# Patient Record
Sex: Female | Born: 1935 | Race: White | Hispanic: No | Smoking: Never smoker
Health system: Southern US, Community
[De-identification: ages and names within clinical notes are randomized; demographics above are authoritative.]

## PROBLEM LIST (undated history)

## (undated) DIAGNOSIS — M1990 Unspecified osteoarthritis, unspecified site: Secondary | ICD-10-CM

## (undated) DIAGNOSIS — E039 Hypothyroidism, unspecified: Secondary | ICD-10-CM

## (undated) DIAGNOSIS — H25019 Cortical age-related cataract, unspecified eye: Secondary | ICD-10-CM

## (undated) DIAGNOSIS — L219 Seborrheic dermatitis, unspecified: Secondary | ICD-10-CM

## (undated) DIAGNOSIS — E785 Hyperlipidemia, unspecified: Secondary | ICD-10-CM

## (undated) DIAGNOSIS — Z923 Personal history of irradiation: Secondary | ICD-10-CM

## (undated) DIAGNOSIS — F329 Major depressive disorder, single episode, unspecified: Secondary | ICD-10-CM

## (undated) DIAGNOSIS — F419 Anxiety disorder, unspecified: Secondary | ICD-10-CM

## (undated) DIAGNOSIS — M109 Gout, unspecified: Secondary | ICD-10-CM

## (undated) DIAGNOSIS — C801 Malignant (primary) neoplasm, unspecified: Secondary | ICD-10-CM

## (undated) DIAGNOSIS — I10 Essential (primary) hypertension: Secondary | ICD-10-CM

## (undated) DIAGNOSIS — Z8612 Personal history of poliomyelitis: Secondary | ICD-10-CM

## (undated) DIAGNOSIS — Z872 Personal history of diseases of the skin and subcutaneous tissue: Secondary | ICD-10-CM

## (undated) DIAGNOSIS — Z853 Personal history of malignant neoplasm of breast: Secondary | ICD-10-CM

## (undated) DIAGNOSIS — N289 Disorder of kidney and ureter, unspecified: Secondary | ICD-10-CM

## (undated) DIAGNOSIS — I319 Disease of pericardium, unspecified: Secondary | ICD-10-CM

## (undated) DIAGNOSIS — E079 Disorder of thyroid, unspecified: Secondary | ICD-10-CM

## (undated) DIAGNOSIS — F32A Depression, unspecified: Secondary | ICD-10-CM

## (undated) DIAGNOSIS — M810 Age-related osteoporosis without current pathological fracture: Secondary | ICD-10-CM

## (undated) HISTORY — DX: Personal history of malignant neoplasm of breast: Z85.3

## (undated) HISTORY — DX: Disorder of kidney and ureter, unspecified: N28.9

## (undated) HISTORY — DX: Personal history of poliomyelitis: Z86.12

## (undated) HISTORY — DX: Disease of pericardium, unspecified: I31.9

## (undated) HISTORY — DX: Hyperlipidemia, unspecified: E78.5

## (undated) HISTORY — DX: Seborrheic dermatitis, unspecified: L21.9

## (undated) HISTORY — PX: BASAL CELL CARCINOMA EXCISION: SHX1214

## (undated) HISTORY — DX: Age-related osteoporosis without current pathological fracture: M81.0

## (undated) HISTORY — PX: BACK SURGERY: SHX140

## (undated) HISTORY — DX: Major depressive disorder, single episode, unspecified: F32.9

## (undated) HISTORY — DX: Cortical age-related cataract, unspecified eye: H25.019

## (undated) HISTORY — DX: Depression, unspecified: F32.A

## (undated) HISTORY — DX: Personal history of diseases of the skin and subcutaneous tissue: Z87.2

## (undated) HISTORY — DX: Unspecified osteoarthritis, unspecified site: M19.90

---

## 2003-12-24 ENCOUNTER — Ambulatory Visit: Payer: Self-pay | Admitting: Family Medicine

## 2004-04-14 HISTORY — PX: FRACTURE SURGERY: SHX138

## 2004-12-03 ENCOUNTER — Emergency Department: Payer: Self-pay | Admitting: Emergency Medicine

## 2004-12-31 ENCOUNTER — Ambulatory Visit: Payer: Self-pay | Admitting: Family Medicine

## 2005-02-18 ENCOUNTER — Other Ambulatory Visit: Payer: Self-pay

## 2005-02-18 ENCOUNTER — Inpatient Hospital Stay: Payer: Self-pay | Admitting: Internal Medicine

## 2005-03-15 ENCOUNTER — Ambulatory Visit: Payer: Self-pay | Admitting: Cardiology

## 2005-03-15 HISTORY — PX: CARDIAC CATHETERIZATION: SHX172

## 2005-05-15 HISTORY — PX: FRACTURE SURGERY: SHX138

## 2005-05-16 ENCOUNTER — Emergency Department: Payer: Self-pay | Admitting: Emergency Medicine

## 2005-05-17 HISTORY — PX: FRACTURE SURGERY: SHX138

## 2005-05-18 ENCOUNTER — Inpatient Hospital Stay: Payer: Self-pay | Admitting: General Practice

## 2006-01-17 ENCOUNTER — Ambulatory Visit: Payer: Self-pay | Admitting: Family Medicine

## 2006-02-14 HISTORY — PX: CATARACT EXTRACTION: SUR2

## 2006-05-04 ENCOUNTER — Ambulatory Visit: Payer: Self-pay | Admitting: Ophthalmology

## 2006-05-10 ENCOUNTER — Ambulatory Visit: Payer: Self-pay | Admitting: Ophthalmology

## 2006-07-30 ENCOUNTER — Emergency Department: Payer: Self-pay | Admitting: Emergency Medicine

## 2006-07-30 ENCOUNTER — Other Ambulatory Visit: Payer: Self-pay

## 2007-01-24 ENCOUNTER — Ambulatory Visit: Payer: Self-pay | Admitting: Family Medicine

## 2008-02-19 ENCOUNTER — Ambulatory Visit: Payer: Self-pay | Admitting: Family Medicine

## 2008-02-27 ENCOUNTER — Ambulatory Visit: Payer: Self-pay | Admitting: Family Medicine

## 2008-04-10 ENCOUNTER — Ambulatory Visit: Payer: Self-pay | Admitting: Ophthalmology

## 2008-04-23 ENCOUNTER — Ambulatory Visit: Payer: Self-pay | Admitting: Ophthalmology

## 2008-06-01 ENCOUNTER — Emergency Department: Payer: Self-pay | Admitting: Emergency Medicine

## 2008-07-21 ENCOUNTER — Ambulatory Visit: Payer: Self-pay | Admitting: Internal Medicine

## 2008-11-20 ENCOUNTER — Ambulatory Visit: Payer: Self-pay | Admitting: Family Medicine

## 2009-02-14 DIAGNOSIS — C801 Malignant (primary) neoplasm, unspecified: Secondary | ICD-10-CM

## 2009-02-14 HISTORY — PX: MASTECTOMY PARTIAL / LUMPECTOMY: SUR851

## 2009-02-14 HISTORY — DX: Malignant (primary) neoplasm, unspecified: C80.1

## 2009-02-14 HISTORY — PX: BREAST BIOPSY: SHX20

## 2009-03-05 ENCOUNTER — Ambulatory Visit: Payer: Self-pay | Admitting: Family Medicine

## 2009-03-12 ENCOUNTER — Ambulatory Visit: Payer: Self-pay | Admitting: Family Medicine

## 2009-04-01 ENCOUNTER — Ambulatory Visit: Payer: Self-pay | Admitting: Surgery

## 2009-04-27 ENCOUNTER — Ambulatory Visit: Payer: Self-pay | Admitting: Surgery

## 2009-05-15 ENCOUNTER — Ambulatory Visit: Payer: Self-pay | Admitting: Internal Medicine

## 2009-05-29 ENCOUNTER — Ambulatory Visit: Payer: Self-pay | Admitting: Surgery

## 2009-06-10 ENCOUNTER — Ambulatory Visit: Payer: Self-pay | Admitting: Internal Medicine

## 2009-06-14 ENCOUNTER — Ambulatory Visit: Payer: Self-pay | Admitting: Internal Medicine

## 2009-07-15 ENCOUNTER — Ambulatory Visit: Payer: Self-pay | Admitting: Internal Medicine

## 2009-08-14 ENCOUNTER — Ambulatory Visit: Payer: Self-pay | Admitting: Internal Medicine

## 2009-09-14 ENCOUNTER — Ambulatory Visit: Payer: Self-pay | Admitting: Internal Medicine

## 2009-10-15 ENCOUNTER — Ambulatory Visit: Payer: Self-pay | Admitting: Internal Medicine

## 2010-02-17 ENCOUNTER — Ambulatory Visit: Payer: Self-pay | Admitting: Internal Medicine

## 2010-03-08 ENCOUNTER — Ambulatory Visit: Payer: Self-pay | Admitting: Family Medicine

## 2010-03-17 ENCOUNTER — Ambulatory Visit: Payer: Self-pay | Admitting: Internal Medicine

## 2010-08-17 ENCOUNTER — Ambulatory Visit: Payer: Self-pay | Admitting: Internal Medicine

## 2010-08-19 LAB — CANCER ANTIGEN 27.29: CA 27.29: 20.1 U/mL (ref 0.0–38.6)

## 2010-09-15 ENCOUNTER — Ambulatory Visit: Payer: Self-pay | Admitting: Internal Medicine

## 2011-03-15 ENCOUNTER — Ambulatory Visit: Payer: Self-pay | Admitting: Surgery

## 2011-04-04 ENCOUNTER — Emergency Department: Payer: Self-pay | Admitting: Emergency Medicine

## 2011-04-04 LAB — URINALYSIS, COMPLETE
Bilirubin,UR: NEGATIVE
Blood: NEGATIVE
Glucose,UR: NEGATIVE mg/dL (ref 0–75)
Nitrite: POSITIVE
Ph: 7 (ref 4.5–8.0)
Protein: NEGATIVE
RBC,UR: 3 /HPF (ref 0–5)
Specific Gravity: 1.014 (ref 1.003–1.030)
Squamous Epithelial: 6

## 2011-04-04 LAB — COMPREHENSIVE METABOLIC PANEL
Albumin: 3.2 g/dL — ABNORMAL LOW (ref 3.4–5.0)
Alkaline Phosphatase: 47 U/L — ABNORMAL LOW (ref 50–136)
BUN: 25 mg/dL — ABNORMAL HIGH (ref 7–18)
Calcium, Total: 9.2 mg/dL (ref 8.5–10.1)
Creatinine: 1.42 mg/dL — ABNORMAL HIGH (ref 0.60–1.30)
EGFR (African American): 45 — ABNORMAL LOW
Glucose: 104 mg/dL — ABNORMAL HIGH (ref 65–99)
Osmolality: 288 (ref 275–301)
Sodium: 142 mmol/L (ref 136–145)
Total Protein: 7.3 g/dL (ref 6.4–8.2)

## 2011-04-04 LAB — CBC
HCT: 39.7 % (ref 35.0–47.0)
HGB: 13 g/dL (ref 12.0–16.0)
MCH: 30.4 pg (ref 26.0–34.0)
MCHC: 32.8 g/dL (ref 32.0–36.0)
MCV: 93 fL (ref 80–100)
Platelet: 202 10*3/uL (ref 150–440)
RBC: 4.29 10*6/uL (ref 3.80–5.20)
RDW: 13.7 % (ref 11.5–14.5)
WBC: 6.6 10*3/uL (ref 3.6–11.0)

## 2011-04-04 LAB — MAGNESIUM: Magnesium: 1.9 mg/dL

## 2011-06-21 ENCOUNTER — Ambulatory Visit: Payer: Self-pay | Admitting: Internal Medicine

## 2011-06-21 LAB — COMPREHENSIVE METABOLIC PANEL
Alkaline Phosphatase: 65 U/L (ref 50–136)
BUN: 27 mg/dL — ABNORMAL HIGH (ref 7–18)
Bilirubin,Total: 0.3 mg/dL (ref 0.2–1.0)
Chloride: 104 mmol/L (ref 98–107)
Glucose: 93 mg/dL (ref 65–99)
Osmolality: 290 (ref 275–301)
Potassium: 3.8 mmol/L (ref 3.5–5.1)
SGOT(AST): 23 U/L (ref 15–37)
SGPT (ALT): 21 U/L
Total Protein: 7.4 g/dL (ref 6.4–8.2)

## 2011-06-21 LAB — CBC CANCER CENTER
Basophil #: 0 x10 3/mm (ref 0.0–0.1)
Basophil %: 0.6 %
Eosinophil #: 0.4 x10 3/mm (ref 0.0–0.7)
Eosinophil %: 5.2 %
HCT: 40 % (ref 35.0–47.0)
Lymphocyte #: 1.5 x10 3/mm (ref 1.0–3.6)
MCHC: 32 g/dL (ref 32.0–36.0)
MCV: 94 fL (ref 80–100)
Monocyte #: 0.8 x10 3/mm (ref 0.2–0.9)
Neutrophil #: 4.4 x10 3/mm (ref 1.4–6.5)
Neutrophil %: 61.5 %
Platelet: 226 x10 3/mm (ref 150–440)
RBC: 4.28 10*6/uL (ref 3.80–5.20)
RDW: 13.6 % (ref 11.5–14.5)

## 2011-06-22 LAB — CANCER ANTIGEN 27.29: CA 27.29: 9.3 U/mL (ref 0.0–38.6)

## 2011-07-16 ENCOUNTER — Ambulatory Visit: Payer: Self-pay | Admitting: Internal Medicine

## 2011-08-24 ENCOUNTER — Ambulatory Visit: Payer: Self-pay | Admitting: Internal Medicine

## 2011-09-15 ENCOUNTER — Ambulatory Visit: Payer: Self-pay | Admitting: Internal Medicine

## 2012-03-15 ENCOUNTER — Ambulatory Visit: Payer: Self-pay | Admitting: Surgery

## 2012-03-19 ENCOUNTER — Ambulatory Visit: Payer: Self-pay | Admitting: Internal Medicine

## 2012-04-26 ENCOUNTER — Ambulatory Visit: Payer: Self-pay | Admitting: Internal Medicine

## 2012-04-27 LAB — CBC CANCER CENTER
Basophil #: 0.1 x10 3/mm (ref 0.0–0.1)
Basophil %: 1.1 %
Eosinophil #: 0.3 x10 3/mm (ref 0.0–0.7)
Eosinophil %: 3.6 %
HCT: 39.2 % (ref 35.0–47.0)
HGB: 12.9 g/dL (ref 12.0–16.0)
Lymphocyte #: 1.8 x10 3/mm (ref 1.0–3.6)
Lymphocyte %: 23.6 %
MCHC: 33 g/dL (ref 32.0–36.0)
Monocyte %: 11.2 %
Platelet: 232 x10 3/mm (ref 150–440)
RDW: 14.1 % (ref 11.5–14.5)
WBC: 7.4 x10 3/mm (ref 3.6–11.0)

## 2012-04-27 LAB — CREATININE, SERUM
Creatinine: 1.29 mg/dL (ref 0.60–1.30)
EGFR (African American): 44 — ABNORMAL LOW
EGFR (Non-African Amer.): 38 — ABNORMAL LOW

## 2012-04-27 LAB — HEPATIC FUNCTION PANEL A (ARMC)
Bilirubin, Direct: <0.05 mg/dL (ref 0.00–0.20)
Bilirubin,Total: 0.3 mg/dL (ref 0.2–1.0)
Total Protein: 7.2 g/dL (ref 6.4–8.2)

## 2012-04-28 LAB — CANCER ANTIGEN 27.29: CA 27.29: 20 U/mL (ref 0.0–38.6)

## 2012-05-15 ENCOUNTER — Ambulatory Visit: Payer: Self-pay | Admitting: Internal Medicine

## 2012-08-21 ENCOUNTER — Ambulatory Visit: Payer: Self-pay | Admitting: Internal Medicine

## 2012-08-27 ENCOUNTER — Emergency Department: Payer: Self-pay | Admitting: Emergency Medicine

## 2012-08-27 LAB — URINALYSIS, COMPLETE
Ketone: NEGATIVE
Nitrite: POSITIVE
Ph: 6 (ref 4.5–8.0)
Protein: NEGATIVE
RBC,UR: 19 /HPF (ref 0–5)
Specific Gravity: 1.02 (ref 1.003–1.030)
Squamous Epithelial: 1
WBC UR: 320 /HPF (ref 0–5)

## 2012-08-29 LAB — URINE CULTURE

## 2013-04-05 ENCOUNTER — Ambulatory Visit: Payer: Self-pay | Admitting: Surgery

## 2013-05-13 ENCOUNTER — Ambulatory Visit: Payer: Self-pay | Admitting: Internal Medicine

## 2013-05-14 LAB — CBC CANCER CENTER
Basophil #: 0.1 x10 3/mm (ref 0.0–0.1)
Basophil %: 1 %
EOS PCT: 2.9 %
Eosinophil #: 0.2 x10 3/mm (ref 0.0–0.7)
HCT: 40.4 % (ref 35.0–47.0)
HGB: 12.7 g/dL (ref 12.0–16.0)
Lymphocyte #: 1.6 x10 3/mm (ref 1.0–3.6)
Lymphocyte %: 19.5 %
MCH: 28.9 pg (ref 26.0–34.0)
MCHC: 31.4 g/dL — ABNORMAL LOW (ref 32.0–36.0)
MCV: 92 fL (ref 80–100)
Monocyte #: 0.8 x10 3/mm (ref 0.2–0.9)
Monocyte %: 9.6 %
NEUTROS PCT: 67 %
Neutrophil #: 5.6 x10 3/mm (ref 1.4–6.5)
Platelet: 256 x10 3/mm (ref 150–440)
RBC: 4.39 10*6/uL (ref 3.80–5.20)
RDW: 13.8 % (ref 11.5–14.5)
WBC: 8.3 x10 3/mm (ref 3.6–11.0)

## 2013-05-14 LAB — HEPATIC FUNCTION PANEL A (ARMC)
Albumin: 3 g/dL — ABNORMAL LOW (ref 3.4–5.0)
Alkaline Phosphatase: 54 U/L
Bilirubin, Direct: 0.1 mg/dL (ref 0.00–0.20)
Bilirubin,Total: 0.3 mg/dL (ref 0.2–1.0)
SGOT(AST): 17 U/L (ref 15–37)
SGPT (ALT): 13 U/L (ref 12–78)
Total Protein: 7.3 g/dL (ref 6.4–8.2)

## 2013-05-14 LAB — CREATININE, SERUM
Creatinine: 1.37 mg/dL — ABNORMAL HIGH (ref 0.60–1.30)
EGFR (African American): 40 — ABNORMAL LOW
GFR CALC NON AF AMER: 35 — AB

## 2013-05-15 ENCOUNTER — Ambulatory Visit: Payer: Self-pay | Admitting: Internal Medicine

## 2013-05-15 LAB — CANCER ANTIGEN 27.29: CA 27.29: 14.9 U/mL (ref 0.0–38.6)

## 2013-07-23 ENCOUNTER — Emergency Department: Payer: Self-pay | Admitting: Emergency Medicine

## 2013-09-18 ENCOUNTER — Ambulatory Visit (INDEPENDENT_AMBULATORY_CARE_PROVIDER_SITE_OTHER): Payer: Medicare Other | Admitting: Podiatry

## 2013-09-18 ENCOUNTER — Encounter: Payer: Self-pay | Admitting: Podiatry

## 2013-09-18 ENCOUNTER — Ambulatory Visit (INDEPENDENT_AMBULATORY_CARE_PROVIDER_SITE_OTHER): Payer: Medicare Other

## 2013-09-18 VITALS — BP 116/61 | HR 69 | Resp 16 | Ht 64.0 in | Wt 205.0 lb

## 2013-09-18 DIAGNOSIS — M109 Gout, unspecified: Secondary | ICD-10-CM

## 2013-09-18 NOTE — Progress Notes (Signed)
   Subjective:    Patient ID: Andrea Schaefer, female    DOB: [birth year], 80 y.o.   MRN: [id number]  HPI Comments: i did have pain in my rt foot. i was diagnosed with gout about 2 months ago. i had surgery on my rt foot 2 months ago at [facility] in the ER. They sliced it open and said it was gout. My foot remains the same. i cant wear shoes. i dont do anything for my foot.  Foot Pain Associated symptoms include fatigue and weakness.      Review of Systems  Constitutional: Positive for fatigue.  HENT: Positive for hearing loss.   Eyes: Positive for redness and itching.  Cardiovascular: Positive for palpitations and leg swelling.  Genitourinary: Positive for urgency and frequency.  Musculoskeletal:       Difficulty walking   Neurological: Positive for tremors, weakness and light-headedness.  Psychiatric/Behavioral: The patient is nervous/anxious.   All other systems reviewed and are negative.      Objective:   Physical Exam: I have reviewed her past medical history medications allergies surgeries social history and review of systems. Pulses are strongly palpable bilateral. Neurologic sensorium is intact per Semmes-Weinstein monofilament. Deep tendon reflexes are intact bilateral muscle strength is 5 over 5 dorsiflexors plantar flexors inverters everters all intrinsic musculature is intact. Orthopedic evaluation demonstrates mild HAV deformity hammertoe deformities are noted bilateral right is most symptomatic with overlying soft tissue mass over the first metatarsophalangeal joint with a central opening for what appears to be drainage. Radiographic evaluation demonstrates minimal osseous abnormalities other than hallux valgus deformity. We tried draining the lesion however the material was too thick and get appear to be a gouty tophus.        Assessment & Plan:  Assessment: Gouty tophus right first metatarsophalangeal joint.  Plan: Try to drain the tophus today however we were unable to do  so. I injected a small amount of Kenalog into the area and will followup with her on an as-needed basis.

## 2013-10-25 ENCOUNTER — Inpatient Hospital Stay: Payer: Self-pay | Admitting: Internal Medicine

## 2013-10-25 DIAGNOSIS — I059 Rheumatic mitral valve disease, unspecified: Secondary | ICD-10-CM

## 2013-10-25 LAB — CBC
HCT: 39.8 % (ref 35.0–47.0)
HGB: 12.3 g/dL (ref 12.0–16.0)
MCH: 28.6 pg (ref 26.0–34.0)
MCHC: 31 g/dL — AB (ref 32.0–36.0)
MCV: 92 fL (ref 80–100)
Platelet: 276 10*3/uL (ref 150–440)
RBC: 4.31 10*6/uL (ref 3.80–5.20)
RDW: 14.3 % (ref 11.5–14.5)
WBC: 10.3 10*3/uL (ref 3.6–11.0)

## 2013-10-25 LAB — BASIC METABOLIC PANEL
ANION GAP: 10 (ref 7–16)
BUN: 20 mg/dL — ABNORMAL HIGH (ref 7–18)
CHLORIDE: 107 mmol/L (ref 98–107)
CREATININE: 1.16 mg/dL (ref 0.60–1.30)
Calcium, Total: 8.5 mg/dL (ref 8.5–10.1)
Co2: 26 mmol/L (ref 21–32)
EGFR (African American): 49 — ABNORMAL LOW
GFR CALC NON AF AMER: 43 — AB
Glucose: 118 mg/dL — ABNORMAL HIGH (ref 65–99)
Osmolality: 289 (ref 275–301)
POTASSIUM: 3.3 mmol/L — AB (ref 3.5–5.1)
Sodium: 143 mmol/L (ref 136–145)

## 2013-10-25 LAB — TROPONIN I: Troponin-I: <0.02 ng/mL

## 2013-10-25 LAB — PRO B NATRIURETIC PEPTIDE: B-Type Natriuretic Peptide: 1825 pg/mL — ABNORMAL HIGH (ref 0–450)

## 2013-10-26 LAB — CBC WITH DIFFERENTIAL/PLATELET
Basophil #: 0.1 10*3/uL (ref 0.0–0.1)
Basophil %: 1.1 %
EOS PCT: 0.4 %
Eosinophil #: 0 10*3/uL (ref 0.0–0.7)
HCT: 35.3 % (ref 35.0–47.0)
HGB: 11.2 g/dL — AB (ref 12.0–16.0)
Lymphocyte #: 2.1 10*3/uL (ref 1.0–3.6)
Lymphocyte %: 24.4 %
MCH: 29 pg (ref 26.0–34.0)
MCHC: 31.8 g/dL — ABNORMAL LOW (ref 32.0–36.0)
MCV: 91 fL (ref 80–100)
MONO ABS: 1 x10 3/mm — AB (ref 0.2–0.9)
Monocyte %: 12 %
NEUTROS ABS: 5.4 10*3/uL (ref 1.4–6.5)
Neutrophil %: 62.1 %
Platelet: 268 10*3/uL (ref 150–440)
RBC: 3.87 10*6/uL (ref 3.80–5.20)
RDW: 14.7 % — AB (ref 11.5–14.5)
WBC: 8.7 10*3/uL (ref 3.6–11.0)

## 2013-10-26 LAB — BASIC METABOLIC PANEL
ANION GAP: 11 (ref 7–16)
BUN: 24 mg/dL — AB (ref 7–18)
CHLORIDE: 104 mmol/L (ref 98–107)
CO2: 27 mmol/L (ref 21–32)
Calcium, Total: 8.4 mg/dL — ABNORMAL LOW (ref 8.5–10.1)
Creatinine: 1.28 mg/dL (ref 0.60–1.30)
EGFR (African American): 44 — ABNORMAL LOW
EGFR (Non-African Amer.): 38 — ABNORMAL LOW
Glucose: 95 mg/dL (ref 65–99)
OSMOLALITY: 287 (ref 275–301)
POTASSIUM: 3.3 mmol/L — AB (ref 3.5–5.1)
SODIUM: 142 mmol/L (ref 136–145)

## 2013-10-26 LAB — MAGNESIUM: MAGNESIUM: 1.5 mg/dL — AB

## 2013-10-27 LAB — BASIC METABOLIC PANEL
Anion Gap: 5 — ABNORMAL LOW (ref 7–16)
BUN: 20 mg/dL — AB (ref 7–18)
CALCIUM: 8.9 mg/dL (ref 8.5–10.1)
CHLORIDE: 103 mmol/L (ref 98–107)
CREATININE: 1.24 mg/dL (ref 0.60–1.30)
Co2: 30 mmol/L (ref 21–32)
GFR CALC AF AMER: 46 — AB
GFR CALC NON AF AMER: 39 — AB
GLUCOSE: 110 mg/dL — AB (ref 65–99)
Osmolality: 279 (ref 275–301)
Potassium: 3.8 mmol/L (ref 3.5–5.1)
Sodium: 138 mmol/L (ref 136–145)

## 2013-10-27 LAB — MAGNESIUM: Magnesium: 1.8 mg/dL

## 2013-10-29 LAB — BASIC METABOLIC PANEL
ANION GAP: 9 (ref 7–16)
BUN: 21 mg/dL — AB (ref 7–18)
Calcium, Total: 9 mg/dL (ref 8.5–10.1)
Chloride: 98 mmol/L (ref 98–107)
Co2: 32 mmol/L (ref 21–32)
Creatinine: 1.37 mg/dL — ABNORMAL HIGH (ref 0.60–1.30)
EGFR (African American): 40 — ABNORMAL LOW
EGFR (Non-African Amer.): 35 — ABNORMAL LOW
Glucose: 96 mg/dL (ref 65–99)
Osmolality: 280 (ref 275–301)
POTASSIUM: 3.7 mmol/L (ref 3.5–5.1)
SODIUM: 139 mmol/L (ref 136–145)

## 2013-10-30 LAB — BASIC METABOLIC PANEL
Anion Gap: 8 (ref 7–16)
BUN: 18 mg/dL (ref 7–18)
Calcium, Total: 8.9 mg/dL (ref 8.5–10.1)
Chloride: 97 mmol/L — ABNORMAL LOW (ref 98–107)
Co2: 34 mmol/L — ABNORMAL HIGH (ref 21–32)
Creatinine: 1.14 mg/dL (ref 0.60–1.30)
EGFR (African American): 50 — ABNORMAL LOW
EGFR (Non-African Amer.): 43 — ABNORMAL LOW
Glucose: 100 mg/dL — ABNORMAL HIGH (ref 65–99)
OSMOLALITY: 280 (ref 275–301)
POTASSIUM: 3.3 mmol/L — AB (ref 3.5–5.1)
SODIUM: 139 mmol/L (ref 136–145)

## 2013-10-30 LAB — CULTURE, BLOOD (SINGLE)

## 2013-10-30 LAB — MAGNESIUM: MAGNESIUM: 1.9 mg/dL

## 2014-02-20 ENCOUNTER — Inpatient Hospital Stay: Payer: Self-pay | Admitting: Internal Medicine

## 2014-02-20 LAB — CBC WITH DIFFERENTIAL/PLATELET
BASOS ABS: 0.1 10*3/uL (ref 0.0–0.1)
Basophil %: 1.2 %
EOS ABS: 0.2 10*3/uL (ref 0.0–0.7)
Eosinophil %: 1.4 %
HCT: 38.7 % (ref 35.0–47.0)
HGB: 12.3 g/dL (ref 12.0–16.0)
LYMPHS ABS: 2.3 10*3/uL (ref 1.0–3.6)
Lymphocyte %: 20 %
MCH: 28.3 pg (ref 26.0–34.0)
MCHC: 31.8 g/dL — AB (ref 32.0–36.0)
MCV: 89 fL (ref 80–100)
MONO ABS: 1.6 x10 3/mm — AB (ref 0.2–0.9)
MONOS PCT: 13.6 %
Neutrophil #: 7.3 10*3/uL — ABNORMAL HIGH (ref 1.4–6.5)
Neutrophil %: 63.8 %
PLATELETS: 303 10*3/uL (ref 150–440)
RBC: 4.35 10*6/uL (ref 3.80–5.20)
RDW: 14.8 % — AB (ref 11.5–14.5)
WBC: 11.4 10*3/uL — AB (ref 3.6–11.0)

## 2014-02-20 LAB — BASIC METABOLIC PANEL
ANION GAP: 10 (ref 7–16)
BUN: 41 mg/dL — ABNORMAL HIGH (ref 7–18)
Calcium, Total: 8.9 mg/dL (ref 8.5–10.1)
Chloride: 91 mmol/L — ABNORMAL LOW (ref 98–107)
Co2: 32 mmol/L (ref 21–32)
Creatinine: 1.76 mg/dL — ABNORMAL HIGH (ref 0.60–1.30)
EGFR (African American): 35 — ABNORMAL LOW
GFR CALC NON AF AMER: 29 — AB
GLUCOSE: 114 mg/dL — AB (ref 65–99)
OSMOLALITY: 277 (ref 275–301)
Potassium: 3.1 mmol/L — ABNORMAL LOW (ref 3.5–5.1)
Sodium: 133 mmol/L — ABNORMAL LOW (ref 136–145)

## 2014-02-20 LAB — URINALYSIS, COMPLETE
BLOOD: NEGATIVE
Bilirubin,UR: NEGATIVE
Glucose,UR: NEGATIVE mg/dL (ref 0–75)
Ketone: NEGATIVE
NITRITE: NEGATIVE
PH: 6 (ref 4.5–8.0)
PROTEIN: NEGATIVE
RBC,UR: 1 /HPF (ref 0–5)
SPECIFIC GRAVITY: 1.004 (ref 1.003–1.030)
Squamous Epithelial: 2
WBC UR: 29 /HPF (ref 0–5)

## 2014-02-20 LAB — TSH: THYROID STIMULATING HORM: 0.644 u[IU]/mL

## 2014-02-21 LAB — CBC WITH DIFFERENTIAL/PLATELET
BASOS ABS: 0.1 10*3/uL (ref 0.0–0.1)
Basophil %: 1.4 %
Eosinophil #: 0.2 10*3/uL (ref 0.0–0.7)
Eosinophil %: 2.5 %
HCT: 36 % (ref 35.0–47.0)
HGB: 11.9 g/dL — ABNORMAL LOW (ref 12.0–16.0)
LYMPHS ABS: 2.2 10*3/uL (ref 1.0–3.6)
Lymphocyte %: 25.6 %
MCH: 29.5 pg (ref 26.0–34.0)
MCHC: 33.1 g/dL (ref 32.0–36.0)
MCV: 89 fL (ref 80–100)
Monocyte #: 1.1 x10 3/mm — ABNORMAL HIGH (ref 0.2–0.9)
Monocyte %: 13.4 %
NEUTROS PCT: 57.1 %
Neutrophil #: 4.8 10*3/uL (ref 1.4–6.5)
PLATELETS: 274 10*3/uL (ref 150–440)
RBC: 4.04 10*6/uL (ref 3.80–5.20)
RDW: 14.9 % — AB (ref 11.5–14.5)
WBC: 8.5 10*3/uL (ref 3.6–11.0)

## 2014-02-21 LAB — BASIC METABOLIC PANEL
Anion Gap: 9 (ref 7–16)
Anion Gap: 6 — ABNORMAL LOW (ref 7–16)
BUN: 33 mg/dL — ABNORMAL HIGH (ref 7–18)
BUN: 30 mg/dL — ABNORMAL HIGH (ref 7–18)
CALCIUM: 8.5 mg/dL (ref 8.5–10.1)
Calcium, Total: 8.5 mg/dL (ref 8.5–10.1)
Chloride: 97 mmol/L — ABNORMAL LOW (ref 98–107)
Chloride: 100 mmol/L (ref 98–107)
Co2: 30 mmol/L (ref 21–32)
Co2: 32 mmol/L (ref 21–32)
Creatinine: 1.41 mg/dL — ABNORMAL HIGH (ref 0.60–1.30)
Creatinine: 1.44 mg/dL — ABNORMAL HIGH (ref 0.60–1.30)
EGFR (African American): 44 — ABNORMAL LOW
EGFR (Non-African Amer.): 37 — ABNORMAL LOW
GFR CALC AF AMER: 45 — AB
GFR CALC NON AF AMER: 37 — AB
Glucose: 103 mg/dL — ABNORMAL HIGH (ref 65–99)
Glucose: 159 mg/dL — ABNORMAL HIGH (ref 65–99)
OSMOLALITY: 279 (ref 275–301)
Osmolality: 285 (ref 275–301)
POTASSIUM: 2.8 mmol/L — AB (ref 3.5–5.1)
Potassium: 3.6 mmol/L (ref 3.5–5.1)
SODIUM: 138 mmol/L (ref 136–145)
Sodium: 136 mmol/L (ref 136–145)

## 2014-02-21 LAB — MAGNESIUM: MAGNESIUM: 2.1 mg/dL

## 2014-02-22 LAB — BASIC METABOLIC PANEL
Anion Gap: 8 (ref 7–16)
BUN: 25 mg/dL — ABNORMAL HIGH (ref 7–18)
CHLORIDE: 104 mmol/L (ref 98–107)
CO2: 30 mmol/L (ref 21–32)
CREATININE: 1.24 mg/dL (ref 0.60–1.30)
Calcium, Total: 8.6 mg/dL (ref 8.5–10.1)
EGFR (African American): 53 — ABNORMAL LOW
GFR CALC NON AF AMER: 43 — AB
Glucose: 103 mg/dL — ABNORMAL HIGH (ref 65–99)
Osmolality: 288 (ref 275–301)
POTASSIUM: 3.5 mmol/L (ref 3.5–5.1)
SODIUM: 142 mmol/L (ref 136–145)

## 2014-02-22 LAB — URINE CULTURE

## 2014-06-07 NOTE — Discharge Summary (Signed)
PATIENT NAME:  Andrea Schaefer, Andrea Schaefer MR#:  [id number] DATE OF BIRTH:  December 27, 1924  DATE OF ADMISSION:  10/25/2013 DATE OF DISCHARGE:    PRIMARY CARE PHYSICIAN: Dr. Lovie Macadamia.   DISCHARGE DIAGNOSES:  1. Bilateral pneumonia.  2. Acute diastolic congestive heart failure.  3. Accelerated hypertension.  4. Acute renal failure.  5. Hyperlipidemia.  6.  Acute respiratory failure.  CONDITION: Stable.   CODE STATUS: Full code.   HOME MEDICATIONS: Please refer to the medication reconciliation list.   The patient needs home oxygen 2 liters by nasal cannula and home health with physical therapy.   DIET: Low-sodium, low-fat, low-cholesterol diet.   ACTIVITY: As tolerated.   FOLLOWUP:  With regular PCP within 1-2 weeks. Also the patient needs a followup BMP with PCP.   REASON FOR ADMISSION: Difficulty breathing.   HOSPITAL COURSE: The patient is an 81 year old Caucasian female with a history of hypertension, hyperlipidemia, presented to the ED with difficulty breathing. She was placed on 2 liters by nasal cannula oxygen. Chest x-ray showed bilateral infiltrate and possible some effusion. For detailed history and physical examination please refer to the admission note dictated by Dr. Tressia Miners.  On admission date the patient's WBC 10.3, hemoglobin 12.3. Potassium 3.3, other electrolytes are normal. BUN 20, creatinine 1.1. Chest x-ray showed cardiomegaly, interstitial prominence, bilateral lower lobe opacity. The patient was admitted for bilateral pneumonia, was treated with Zithromax and Rocephin. The blood culture is negative. In addition the patient has been treated with oxygen by nasal cannula with nebulizer treatment.   Acute diastolic CHF.  The patient was initially treated with antibiotics, but the patient continuously had cough and shortness of breath, so we repeated a chest x-ray. Chest x-ray showed mild pulmonary edema. The patient was diagnosed with acute diastolic CHF.  We started IV Lasix and  changed to p.o. 20 mg p.o. daily. The patient's cough and shortness of breath are getting better, but the patient is still on O2 oxygen 2 liters by nasal cannula. The patient's O2 saturations decreased to 85% without oxygen. The patient had acute respiratory failure, needs home oxygen 2 liters by nasal cannula.  Acute renal failure. The patient's creatinine increased to 1.37 yesterday. We held Lasix, but since the patient does has low O2 saturation without oxygen we resumed Lasix today. The patient does need Lasix on a daily basis, she needs Lasix p.o. daily.   Accelerated hypertension. The patient has been treated with hydralazine p.r.n., with clonidine, Lopressor. The blood pressure has been stable. The patient has no complaints, but has generalized weakness. The patient underwent physical therapy, physical therapy suggested the patient needs home physical therapy and home health.    The patient's vital signs are stable. She is clinically stable and will be discharged to home today. I discussed the patient's discharge plan with the patient and the patient's son, nurse, and case Freight forwarder.   TIME SPENT: About 38 minutes.    ____________________________ Demetrios Loll, MD qc:bu D: 10/30/2013 12:41:00 ET T: 10/30/2013 13:22:23 ET JOB#: [id number]  cc: Demetrios Loll, MD, <Dictator> Demetrios Loll MD ELECTRONICALLY SIGNED 10/30/2013 15:48

## 2014-06-07 NOTE — H&P (Signed)
PATIENT NAME:  Andrea Schaefer, Andrea Schaefer MR#:  [id number] DATE OF BIRTH:  1924/08/20  DATE OF ADMISSION:  10/25/2013  ADMITTING PHYSICIAN: Gladstone Lighter, MD   PRIMARY CARE PHYSICIAN: Benay Pike, MD  PRIMARY ONCOLOGIST: Leia Alf, MD   CHIEF COMPLAINT: Difficulty breathing.   HISTORY OF PRESENT ILLNESS: Ms. Hausner is an 81 year old elderly Caucasian female with past medical history significant for polio with axial tremors at baseline, history of breast cancer in remission, and also history of hypertension, who presents to the hospital secondary to difficulty breathing that started yesterday evening.  The patient states she felt like she was wheezing externally, could not catch her breath, could not sleep at all last night and this morning presented to the Emergency Room.  She was hypoxic here, placed on 2 liters nasal cannula. Chest x-ray showing bilateral infiltrates and possible some effusion too.  She is being admitted for hypoxic respiratory failure secondary to pneumonia.  Denies any nausea, vomiting, recent travel or sick contacts. No prior history of chronic obstructive pulmonary disease.   PAST MEDICAL HISTORY:  1.  Hypertension.  2.  Depression.  3.  Hyperlipidemia.  4.  History of poliomyelitis when young with axial tremors.  5.  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   PAST SURGICAL HISTORY:  1.  Right foot surgery for gout.  2.  Right breast with lump resection.   ALLERGIES: CODEINE, DARVOCET, MORPHINE, TETRACYCLINE AND TAPE.   CURRENT HOME MEDICATIONS:  1.  Tylenol 325 mg p.o. daily p.r.n.  2.  Allopurinol 100 mg p.o. daily.  3.  Aspirin 81 mg p.o. daily.  4.  Benzonatate 100 mg p.o. 3 times a day p.r.n. for cough.  5.  Lexapro 20 mg p.o. daily.  6.  Hydrochlorothiazide triamterene 25/37.5 mg 1 tablet p.o. daily.  7.  Levothyroxine 100 mcg p.o. daily.  8.  Metoprolol 25 mg p.o. t.i.d.  9.  Simvastatin 20 mg p.o. daily.  10.   Tolterodine 1 mg p.o. b.i.d.  11. Tums extra strength chewable once a day.   SOCIAL HISTORY: Lives at home by herself, uses a cane to walk.  Also her son lives with her, but he has some disability. No history of smoking or alcohol.     FAMILY HISTORY: Sister died from leukemia. Father had lung cancer, brother with multiple myeloma, and another sister with lymphoma.   REVIEW OF SYSTEMS:  CONSTITUTIONAL: No fever. Positive for fatigue and weakness.  EYES: No blurry vision, double vision, inflammation or glaucoma.  Had cataract surgery in both eyes.  ENT: Mild hearing loss present. No tinnitus, ear pain, epistaxis or discharge.  RESPIRATORY: Positive for cough, wheeze, no hemoptysis.  No chronic obstructive pulmonary disease.  Positive for dyspnea on exertion.  CARDIOVASCULAR: No chest pain, orthopnea, edema, arrhythmia, hemoptysis, or syncope.  GASTROINTESTINAL: No nausea, vomiting, diarrhea, abdominal pain, hematemesis, or melena.  GENITOURINARY: No dysuria, hematuria, renal calculus, frequency, or incontinence.  ENDOCRINE: No polyuria, nocturia, thyroid problems, heat or cold intolerance.  HEMATOLOGY: No anemia, easy bruising or bleeding.  SKIN: No acne, rash or lesions.  MUSCULOSKELETAL: No neck, back, shoulder pain, arthritis or gout.  NEUROLOGIC: No numbness, weakness, CVA, TIA or seizures.  PSYCHOLOGICAL: No anxiety, insomnia, depression.    PHYSICAL EXAMINATION:    VITAL SIGNS: Temperature 98.7 degrees Fahrenheit, pulse 74, respirations 18, blood pressure 164/90, pulse oximetry 88% on room air.  GENERAL: Well-built, well-nourished female sitting in bed in mild respiratory distress and  also has axial tremors.  HEENT:  Normocephalic, atraumatic. Pupils equal, round, reacting to light. Anicteric sclerae. Extraocular movements intact. Oropharynx clear without erythema, mass or exudates.  NECK: Supple. No thyromegaly, JVD or carotid bruits.  No lymphadenopathy.  LUNGS: Moving air  bilaterally. No wheezing, but she does have bibasilar crackles and rhonchi, worse on the right side. Minimal use of accessory muscles on exertion.  CARDIOVASCULAR: S1, S2, regular rate and rhythm. A 3/6 systolic murmur present. No rubs or gallops.  ABDOMEN: Soft, nontender, nondistended. No hepatosplenomegaly. Normal bowel sounds.  EXTREMITIES:  1+ pedal edema and also distal leg. No clubbing or cyanosis. 2+ dorsalis pedis pulses palpable bilaterally.  SKIN: No acne, rash or lesions.  LYMPHATICS: No cervical or inguinal lymphadenopathy.  NEUROLOGIC: Cranial nerves intact. No focal motor or sensory deficits.  PSYCHOLOGICAL: The patient is awake, alert, oriented x 3.   LABORATORY DATA: WBC 10.3, hemoglobin 12.3, hematocrit 39.8, platelet count 276,000.   Sodium 140, potassium 3.3, chloride 107, bicarbonate 26, BUN 20, creatinine 1.1, glucose 118, calcium of 8.5.  BNP is slightly elevated at 1825. Troponin is negative. Chest x-ray showing cardiomegaly, interstitial prominence, bilateral lower lobe opacity could be infection versus atelectasis. Old wedge compression fracture of indeterminate age noted.  EKG showing normal sinus rhythm, heart rate of 76. No acute ST-T wave abnormalities.   ASSESSMENT AND PLAN: An 81 year old female with hypertension and hyperlipidemia, history of breast cancer presents with hypoxia noted to have pneumonia.  1.  Bilateral pneumonia, right greater than left.  We will continue oxygen support.  Blood cultures have been ordered.  Antibiotics have been given.  However, there is a low concern for congestive heart failure too, because of elevated BNP, mild pedal edema which is chronic though for the patient, so we will get an echocardiogram as well and hold off fluids until the echocardiogram is done.  2.  Hypertension. IV hydralazine p.r.n., clonidine p.o. stat, restart oral medications from home.  3.  Hyperlipidemia. Continue home medications.  4.  Breast cancer follows with  Dr. Ma Hillock, currently in remission on tamoxifen.  5.  Deep vein thrombosis prophylaxis ordered.  6.  Physical therapy consult has been requested as well.      CODE STATUS: FULL CODE.   TIME SPENT ON ADMISSION: 50 minutes.   ____________________________ Gladstone Lighter, MD rk:DT D: 10/25/2013 11:47:09 ET T: 10/25/2013 12:07:20 ET JOB#: [id number]  cc: Gladstone Lighter, MD, <Dictator> Youlanda Roys. Lovie Macadamia, MD Gladstone Lighter MD ELECTRONICALLY SIGNED 10/25/2013 14:35

## 2014-06-15 NOTE — Discharge Summary (Signed)
PATIENT NAME:  Andrea Schaefer, FISCHETTI MR#:  [id number] DATE OF BIRTH:  [birth year]  DATE OF ADMISSION:  02/20/2014 DATE OF DISCHARGE:  02/22/2014  PRIMARY CARE PHYSICIAN: Youlanda Roys. Lovie Macadamia, MD   FINAL DIAGNOSES:  1. Severe hypokalemia.  2. Acute renal failure and dehydration.  3. Essential hypertension.  4. Acute cystitis without hematuria.  5. Hyperlipidemia, unspecified.  6. Hypothyroidism, unspecified.   MEDICATIONS ON DISCHARGE: Metoprolol 25 mg 3 times a day, levothyroxine 100 mcg daily, Lexapro 20 mg daily, aspirin 81 mg daily, simvastatin 20 mg daily, Klor-Con 20 mEq once a day for 5 days, cephalexin 250 mg 1 capsule 3 times a day for 5 days. Stop taking Dyazide and Cipro.   HOME HEALTH: Yes, physical therapy and nurse.   DIET: Low-sodium regular consistency.   ACTIVITY: As tolerated. Followup 1 to 2 weeks with Dr. Lovie Macadamia.   HOSPITAL COURSE: The patient was admitted 02/20/2014 and discharged 02/22/2014. The patient came in with generalized weakness, UTI as per the son. She also had some hallucination. The patient was admitted with acute cystitis without hematuria, acute renal failure, and hypokalemia. She was started on IV Rocephin and IV fluids. Dyazide was stopped and potassium was replaced. Physical therapy was ordered for her weakness.   LABORATORY AND RADIOLOGICAL DATA DURING THE HOSPITAL COURSE: Included an EKG that showed sinus rhythm, first-degree AV block, left anterior fascicular block, LVH. TSH 0.644. White blood cell count 11.4, H and H 12.3 and 38.7, platelet count of 303, glucose 114, BUN 41, creatinine 1.76, sodium 133, potassium 3.1, chloride 91, CO2 32, calcium 8.9. Urine culture mixed bacterial organisms, suggestive of contamination. Urinalysis 1+ leukocyte esterase, magnesium 2.1. Potassium dipped down to 2.8 and creatinine on the 8th was down to 1.41. Creatinine on the 9th 1.24 with a potassium of 3.5. White count 8.5 upon discharge.   HOSPITAL COURSE PER PROBLEM LIST:   1. For the patient's severe hypokalemia, I believe this is secondary to Dyazide. This is not a good medication for this patient. Potassium supplementation for another 5 days. Upon discharge, I think stopping the Dyazide will help out.  2. Acute renal failure and dehydration. The patient was advised to eat and drink. Dyazide was stopped. The patient must stay hydrated in order to prevent this again.  3. Essential hypertension. Blood pressure is stable on metoprolol.  4. Acute cystitis without hematuria. Culture grew out contamination. I did give Rocephin here and switched her over to cephalexin for 5 more days at home.  5. Hyperlipidemia, unspecified, on simvastatin.  6. Hypothyroidism, unspecified, on levothyroxine. TSH normal range.   TIME SPENT ON DISCHARGE: 35 minutes.   Home health will be set up at home. The patient does live with one of her sons, who helps take care of her.     ____________________________ Tana Conch. Leslye Peer, MD rjw:ap D: 02/22/2014 15:27:00 ET T: 02/22/2014 17:09:32 ET JOB#: [id number]  cc: Youlanda Roys. Lovie Macadamia, MD Tana Conch. Leslye Peer, MD, <Dictator>   Marisue Brooklyn MD ELECTRONICALLY SIGNED 02/23/2014 15:15

## 2014-06-15 NOTE — H&P (Signed)
PATIENT NAME:  Andrea Schaefer, Andrea Schaefer MR#:  [id number] DATE OF BIRTH:  31-Dec-1924  DATE OF ADMISSION:  02/20/2014  PRIMARY DOCTOR: Youlanda Roys. Lovie Macadamia, MD  EMERGENCY ROOM PHYSICIAN: Briant Sites. Joni Fears, MD  CHIEF COMPLAINT: Generalized weakness and UTI.   HISTORY OF PRESENT ILLNESS: An 81 year old female patient brought in because of generalized weakness and a UTI. She got a UTI and went to Dr. Lovie Macadamia yesterday and she was given Cipro. Patient took 1 dose of Cipro this morning but her doctor, Dr. Lovie Macadamia, called her and said that she has dehydration and her son wanted her to be checked, so he called the ambulance. The patient says that she has no diarrhea, nausea, or vomiting but feels generalized weakness and also feels tired. The patient also has incontinence and frequency of urination for about a few days. The patient found to have acute renal failure with creatinine of 1.76 and potassium of 3.1. The patient is admitted for UTI and acute renal failure. Patient says that she is constipated for a long time. Denies any other complaints.   PAST MEDICAL HISTORY: Significant for: 1.  History of hypertension, depression. 2.  She also has a history of hyperlipidemia, poliomyelitis when she was young with axial tremors.  3.  History of breast cancer, status post radiation and surgery.   PAST SURGICAL HISTORY: Right foot surgery for gout and right breast lump resection.   ALLERGIES: CODEINE, DARVOCET, MORPHINE, TETRACYCLINE, AND TAPE.  SOCIAL HISTORY: The patient lives at home by herself and she has a disabled son at home. No smoking. No drinking.   FAMILY HISTORY: Sister had leukemia. Father had lung cancer. Brother had multiple myeloma and residual lymphoma.  REVIEW OF SYSTEMS: CONSTITUTIONAL: Has no fever, no fatigue. EYES: No blurred vision. ENT: No tinnitus. No epistaxis. No difficulty swallowing.  RESPIRATORY: Patient has no cough, no wheezing.   CARDIOVASCULAR: No chest pain. No orthopnea. No  PND. GASTROINTESTINAL: Has generalized weakness but no abdominal pain, no nausea, no vomiting. Does have constipation.  GENITOURINARY: Patient complains of dysuria and frequency of urination for a few days.  ENDOCRINE: No polyuria or nocturia. HEMATOLOGIC: No anemia or easy bruising.  INTEGUMENT: No skin rash. MUSCULOSKELETAL: No joint pain. NEUROLOGICAL: No numbness or weakness. No CVAs or TIAs.  PSYCHIATRIC: No anxiety or insomnia.   HOME MEDICATIONS: Include aspirin 81 mg daily, Cipro 250 mg p.o. b.i.d., Lexapro 20 mg p.o. daily, Lasix 20 mg daily, hydrochlorothiazide and triamterene 25/37.5 mg p.o. daily, levothyroxine 100 mcg p.o. daily, metoprolol tartrate 25 mg p.o. t.i.d., simvastatin 20 mg p.o. daily.   PHYSICAL EXAMINATION:  VITAL SIGNS: Temperature 97.3, heart rate 74, blood pressure 132/73, saturation is 97% on room air.  GENERAL: The patient is alert, awake, oriented, elderly female not in distress, answering questions appropriately.  HEAD: Atraumatic, normocephalic.  EYES: Pupils equal, reacting to light. No scleral icterus. Extraocular movements intact.  NOSE: No nasal lesions. No drainage.  EARS: No drainage. No external lesions.  MOUTH: No lesions. Mucosa is clinically dry.  NECK: Supple. No JVD. No carotid bruit. Thyroid in the midline. Normal range of motion.  RESPIRATORY: Good respiratory effort. Clear to auscultation. No wheeze. No rales.  CARDIOVASCULAR: S1, S2 regular. No murmurs. The patient's PMI not displaced. Peripheral pulses are equal in femoral and dorsalis pedis. Does not have any peripheral edema.  ABDOMEN: Soft, nontender, nondistended. Bowel sounds present. No hepatosplenomegaly.  EXTREMITIES: No extremity edema. No cyanosis. No clubbing.  SKIN: Inspection normal. No rashes.  MUSCULOSKELETAL: Able  to move extremities x 4. The patient has no joint effusion. Normal range of motion.  NEUROLOGIC: Cranial nerves II-XII intact. Power 5/5 in upper and lower  extremities. Sensory is intact. DTR 2+ bilaterally.  PSYCHIATRIC: Mood and affect are within normal limits.  LABORATORY DATA: The patient's white count 11.4, hemoglobin 12.3, hematocrit 38.7, platelets 303,000. TSH 0.644. Electrolytes: Sodium is 133, potassium 3.1, chloride 91, bicarbonate 32, BUN 41, creatinine 1.76, glucose 114. The patient's UA is 1+ leukocyte esterase, WBC 29, bacteria trace. Normal kidney function in September of last year.   ASSESSMENT AND PLAN: The patient is an 81 year old female patient with urinary tract infection and acute renal failure and hypokalemia admitted to hospitalist service for urinary tract infection. 1.  Started on Rocephin and follow urine cultures.  2.  Acute renal failure secondary to dehydration urinary tract infection. Continue intravenous fluids. Hold her Lasix hydrochlorothiazide and monitor kidney function and urine output.  3.  Hypokalemia. Replace the potassium.  4.  Generalized weakness get physical therapy evaluation.  5.  Depression. Continue home medications.  6.  Hypothyroidism. Continue Synthroid.  7.  Hypertension. Continue metoprolol but hold Lasix and hydrochlorothiazide and triamterene secondary to renal failure.   TIME SPENT: 55 minutes on history and physical.    ____________________________ Epifanio Lesches, MD sk:ST D: 02/20/2014 21:03:37 ET T: 02/20/2014 22:06:11 ET JOB#: [id number]  cc: Epifanio Lesches, MD, <Dictator> Epifanio Lesches MD ELECTRONICALLY SIGNED 03/19/2014 17:26

## 2014-09-06 ENCOUNTER — Encounter: Payer: Self-pay | Admitting: Emergency Medicine

## 2014-09-06 ENCOUNTER — Emergency Department
Admission: EM | Admit: 2014-09-06 | Discharge: 2014-09-06 | Disposition: A | Discharge status: Home / Self Care | Payer: Medicare Other | Attending: Emergency Medicine | Admitting: Emergency Medicine

## 2014-09-06 ENCOUNTER — Emergency Department: Payer: Medicare Other

## 2014-09-06 DIAGNOSIS — Z79899 Other long term (current) drug therapy: Secondary | ICD-10-CM | POA: Insufficient documentation

## 2014-09-06 DIAGNOSIS — Z7982 Long term (current) use of aspirin: Secondary | ICD-10-CM | POA: Diagnosis not present

## 2014-09-06 DIAGNOSIS — I10 Essential (primary) hypertension: Secondary | ICD-10-CM | POA: Diagnosis not present

## 2014-09-06 DIAGNOSIS — E860 Dehydration: Secondary | ICD-10-CM

## 2014-09-06 DIAGNOSIS — M79605 Pain in left leg: Secondary | ICD-10-CM | POA: Diagnosis present

## 2014-09-06 DIAGNOSIS — M791 Myalgia: Secondary | ICD-10-CM | POA: Insufficient documentation

## 2014-09-06 HISTORY — DX: Disorder of thyroid, unspecified: E07.9

## 2014-09-06 HISTORY — DX: Anxiety disorder, unspecified: F41.9

## 2014-09-06 HISTORY — DX: Hypothyroidism, unspecified: E03.9

## 2014-09-06 HISTORY — DX: Essential (primary) hypertension: I10

## 2014-09-06 HISTORY — DX: Gout, unspecified: M10.9

## 2014-09-06 LAB — COMPREHENSIVE METABOLIC PANEL
ALBUMIN: 3.1 g/dL — AB (ref 3.5–5.0)
ALT: 11 U/L — AB (ref 14–54)
ANION GAP: 9 (ref 5–15)
AST: 18 U/L (ref 15–41)
Alkaline Phosphatase: 73 U/L (ref 38–126)
BILIRUBIN TOTAL: 0.6 mg/dL (ref 0.3–1.2)
BUN: 20 mg/dL (ref 6–20)
CALCIUM: 8.6 mg/dL — AB (ref 8.9–10.3)
CO2: 27 mmol/L (ref 22–32)
Chloride: 102 mmol/L (ref 101–111)
Creatinine, Ser: 0.9 mg/dL (ref 0.44–1.00)
GFR calc Af Amer: >60 mL/min (ref >60)
GFR, EST NON AFRICAN AMERICAN: 56 mL/min — AB (ref >60)
Glucose, Bld: 113 mg/dL — ABNORMAL HIGH (ref 65–99)
Potassium: 3.3 mmol/L — ABNORMAL LOW (ref 3.5–5.1)
SODIUM: 138 mmol/L (ref 135–145)
TOTAL PROTEIN: 6.8 g/dL (ref 6.5–8.1)

## 2014-09-06 LAB — CBC WITH DIFFERENTIAL/PLATELET
BASOS ABS: 0.2 10*3/uL — AB (ref 0–0.1)
Basophils Relative: 2 %
Eosinophils Absolute: 0.2 10*3/uL (ref 0–0.7)
Eosinophils Relative: 2 %
HCT: 38.8 % (ref 35.0–47.0)
Hemoglobin: 12.4 g/dL (ref 12.0–16.0)
LYMPHS PCT: 18 %
Lymphs Abs: 1.9 10*3/uL (ref 1.0–3.6)
MCH: 28.3 pg (ref 26.0–34.0)
MCHC: 31.9 g/dL — AB (ref 32.0–36.0)
MCV: 88.6 fL (ref 80.0–100.0)
MONO ABS: 1 10*3/uL — AB (ref 0.2–0.9)
Monocytes Relative: 9 %
NEUTROS ABS: 7.7 10*3/uL — AB (ref 1.4–6.5)
NEUTROS PCT: 69 %
PLATELETS: 304 10*3/uL (ref 150–440)
RBC: 4.38 MIL/uL (ref 3.80–5.20)
RDW: 15 % — AB (ref 11.5–14.5)
WBC: 11.1 10*3/uL — AB (ref 3.6–11.0)

## 2014-09-06 LAB — TROPONIN I: Troponin I: <0.03 ng/mL (ref <0.031)

## 2014-09-06 MED ORDER — SODIUM CHLORIDE 0.9 % IV BOLUS (SEPSIS)
500.0000 mL | Freq: Once | INTRAVENOUS | Status: AC
  Administered 2014-09-06: 500 mL via INTRAVENOUS

## 2014-09-06 NOTE — Discharge Instructions (Signed)
Dehydration, Adult °Dehydration is when you lose more fluids from the body than you take in. Vital organs like the kidneys, brain, and heart cannot function without a proper amount of fluids and salt. Any loss of fluids from the body can cause dehydration.  °CAUSES  °· Vomiting. °· Diarrhea. °· Excessive sweating. °· Excessive urine output. °· Fever. °SYMPTOMS  °Mild dehydration °· Thirst. °· Dry lips. °· Slightly dry mouth. °Moderate dehydration °· Very dry mouth. °· Sunken eyes. °· Skin does not bounce back quickly when lightly pinched and released. °· Dark urine and decreased urine production. °· Decreased tear production. °· Headache. °Severe dehydration °· Very dry mouth. °· Extreme thirst. °· Rapid, weak pulse (more than 100 beats per minute at rest). °· Cold hands and feet. °· Not able to sweat in spite of heat and temperature. °· Rapid breathing. °· Blue lips. °· Confusion and lethargy. °· Difficulty being awakened. °· Minimal urine production. °· No tears. °DIAGNOSIS  °Your caregiver will diagnose dehydration based on your symptoms and your exam. Blood and urine tests will help confirm the diagnosis. The diagnostic evaluation should also identify the cause of dehydration. °TREATMENT  °Treatment of mild or moderate dehydration can often be done at home by increasing the amount of fluids that you drink. It is best to drink small amounts of fluid more often. Drinking too much at one time can make vomiting worse. Refer to the home care instructions below. °Severe dehydration needs to be treated at the hospital where you will probably be given intravenous (IV) fluids that contain water and electrolytes. °HOME CARE INSTRUCTIONS  °· Ask your caregiver about specific rehydration instructions. °· Drink enough fluids to keep your urine clear or pale yellow. °· Drink small amounts frequently if you have nausea and vomiting. °· Eat as you normally do. °· Avoid: °¨ Foods or drinks high in sugar. °¨ Carbonated  drinks. °¨ Juice. °¨ Extremely hot or cold fluids. °¨ Drinks with caffeine. °¨ Fatty, greasy foods. °¨ Alcohol. °¨ Tobacco. °¨ Overeating. °¨ Gelatin desserts. °· Wash your hands well to avoid spreading bacteria and viruses. °· Only take over-the-counter or prescription medicines for pain, discomfort, or fever as directed by your caregiver. °· Ask your caregiver if you should continue all prescribed and over-the-counter medicines. °· Keep all follow-up appointments with your caregiver. °SEEK MEDICAL CARE IF: °· You have abdominal pain and it increases or stays in one area (localizes). °· You have a rash, stiff neck, or severe headache. °· You are irritable, sleepy, or difficult to awaken. °· You are weak, dizzy, or extremely thirsty. °SEEK IMMEDIATE MEDICAL CARE IF:  °· You are unable to keep fluids down or you get worse despite treatment. °· You have frequent episodes of vomiting or diarrhea. °· You have blood or green matter (bile) in your vomit. °· You have blood in your stool or your stool looks black and tarry. °· You have not urinated in 6 to 8 hours, or you have only urinated a small amount of very dark urine. °· You have a fever. °· You faint. °MAKE SURE YOU:  °· Understand these instructions. °· Will watch your condition. °· Will get help right away if you are not doing well or get worse. °Document Released: 01/31/2005 Document Revised: 04/25/2011 Document Reviewed: 09/20/2010 °[employer]® Patient Information ©2015 [employer]. This information is not intended to replace advice given to you by your health care provider. Make sure you discuss any questions you have with your health care   provider.    You've been seen in the emergency department for possible dehydration as well as buttock pain. Your x-ray shows no acute fractures or abnormalities. Her lab work is largely within normal limits. Please drink plenty fluids of the next several days, and follow-up with your primary care doctor in 2-3 days for  recheck. Return to the emergency department for any personally concerning symptoms.

## 2014-09-06 NOTE — ED Notes (Signed)
Patient with no complaints at this time. Respirations even and unlabored. Skin warm/dry. Discharge instructions reviewed with patient at this time. Patient given opportunity to voice concerns/ask questions. IV removed per policy and band-aid applied to site. Patient discharged at this time and left Emergency Department.

## 2014-09-06 NOTE — ED Provider Notes (Signed)
[facility] Emergency Department Provider Note  Time seen: 3:50 AM  I have reviewed the triage vital signs and the nursing notes.   HISTORY  Chief Complaint Leg Pain    HPI Andrea Schaefer is a 81 y.o. female with a past medical history of hypertension, anxiety, hypothyroidism who presents the emergency department complaining of buttock pain and dehydration. According to the patient she fell approximately one week ago, she had x-rays performed by her primary care doctor that showed no fractures. She was prescribed tramadol for pain which she has been taking. Patient states she has continued to have some pain. Tonight she was feeling pain in her buttocks, and she felt that she was getting dehydrated as she has not been eating and drinking as much since the fall. She states her mouth is very dry so she called EMS for an evaluation. Patient states continued buttock pain, but denies any acute increase in pain. Patient lives with her son.     Past Medical History  Diagnosis Date  . Hypertension   . Anxiety   . Gout   . Thyroid disease   . Hypothyroidism     There are no active problems to display for this patient.   History reviewed. No pertinent past surgical history.  Current Outpatient Rx  Name  Route  Sig  Dispense  Refill  . aspirin EC 81 MG tablet   Oral   Take 81 mg by mouth daily.         . Calcium Citrate (CITRACAL PO)   Oral   Take by mouth daily.         Marland Kitchen escitalopram (LEXAPRO) 20 MG tablet   Oral   Take 20 mg by mouth daily.         . metoprolol tartrate (LOPRESSOR) 25 MG tablet      TAKE ONE TABLET BY MOUTH THREE TIMES DAILY         . Multiple Vitamins-Minerals (CENTRUM PO)   Oral   Take by mouth daily.         . simvastatin (ZOCOR) 20 MG tablet      TAKE ONE TABLET BY MOUTH ONCE DAILY         . tolterodine (DETROL) 1 MG tablet   Oral   Take 1 mg by mouth 2 (two) times daily.         . traMADol (ULTRAM) 50 MG  tablet   Oral   Take by mouth every 6 (six) hours as needed.         Marland Kitchen EXPIRED: triamterene-hydrochlorothiazide (DYAZIDE) 37.5-25 MG per capsule   Oral   Take by mouth.           Allergies Adhesive; Codeine; Tetracyclines & related; and Keflex  No family history on file.  Social History History  Substance Use Topics  . Smoking status: Never Smoker   . Smokeless tobacco: Not on file  . Alcohol Use: No    Review of Systems Constitutional: Negative for fever. Cardiovascular: Negative for chest pain. Respiratory: Negative for shortness of breath. Gastrointestinal: Negative for abdominal pain Musculoskeletal: Positive for buttock pain  10-point ROS otherwise negative.  ____________________________________________   PHYSICAL EXAM:  VITAL SIGNS: ED Triage Vitals  Enc Vitals Group     BP 09/06/14 0342 224/98 mmHg     Pulse Rate 09/06/14 0342 66     Resp 09/06/14 0342 20     Temp 09/06/14 0342 98.2 F (36.8 C)  Temp Source 09/06/14 0342 Oral     SpO2 09/06/14 0342 94 %     Weight 09/06/14 0342 184 lb (83.462 kg)     Height 09/06/14 0342 5\' 3"  (1.6 m)     Head Cir --      Peak Flow --      Pain Score 09/06/14 0343 8     Pain Loc --      Pain Edu? --      Excl. in [place]? --     Constitutional: Alert and oriented. Well appearing and in no distress. ENT   Head: Normocephalic and atraumatic   Mouth/Throat: Very dry mucous membranes. Cardiovascular: Normal rate, regular rhythm. No murmur Respiratory: Normal respiratory effort without tachypnea nor retractions. Breath sounds are clear  Gastrointestinal: Soft and nontender. No distention.   Musculoskeletal: Nontender with normal range of motion in all extremities. No lower extremity tenderness or edema. Great range of motion in all joints. No tenderness elicited on exam. Neurologic:  Normal speech and language. No gross focal neurologic deficits Skin:  Skin is warm, dry and intact.  Psychiatric: Mood and  affect are normal. Speech and behavior are normal. ____________________________________________   RADIOLOGY  X-ray of the pelvis shows no fracture.  ____________________________________________   INITIAL IMPRESSION / ASSESSMENT AND PLAN / ED COURSE  Pertinent labs & imaging results that were available during my care of the patient were reviewed by me and considered in my medical decision making (see chart for details).  Patient with complaints of continued buttock pain, but denies any acute increase. She states "it feels like it was about to start hurting again." Patient also complains of dry mouth, feels that she is dehydrated. She states she has been very dehydrated before and it felt like this. We will check labs, IV hydrate, and closely monitor in the emergency department.  X-ray within normal limits. Labs are largely within normal limits, slight white blood cell count elevation. Patient has received a 500 cc bolus of normal saline. We will discharge the patient home with primary care follow-up. Patient agreeable to plan.  ____________________________________________   FINAL CLINICAL IMPRESSION(S) / ED DIAGNOSES  Pelvic pain Dehydration   Harvest Dark, MD 09/06/14 [phone]

## 2014-09-06 NOTE — ED Notes (Signed)
Pt presents to ED via EMS from personal home with c/o of fall and dehydration. EMS states pt had a fall approximately last week and has seen a PCP for these presenting sx. EMS reports pt lost balance and major impact was located to bottom area. Pt arrived to treatment room alert and oriented x4. No obvious deformities noted. No bleeding noted. Pt states she has noticed dry mouth sx for the past couple days.

## 2014-09-12 ENCOUNTER — Inpatient Hospital Stay
Admission: EM | Admit: 2014-09-12 | Discharge: 2014-09-18 | DRG: 552 | Disposition: A | Discharge status: Skilled Nursing Facility | Payer: Medicare Other | Attending: Internal Medicine | Admitting: Internal Medicine

## 2014-09-12 ENCOUNTER — Encounter: Payer: Self-pay | Admitting: *Deleted

## 2014-09-12 ENCOUNTER — Emergency Department: Payer: Medicare Other

## 2014-09-12 DIAGNOSIS — Z8249 Family history of ischemic heart disease and other diseases of the circulatory system: Secondary | ICD-10-CM

## 2014-09-12 DIAGNOSIS — E039 Hypothyroidism, unspecified: Secondary | ICD-10-CM | POA: Diagnosis present

## 2014-09-12 DIAGNOSIS — S32031A Stable burst fracture of third lumbar vertebra, initial encounter for closed fracture: Secondary | ICD-10-CM | POA: Diagnosis present

## 2014-09-12 DIAGNOSIS — S32030A Wedge compression fracture of third lumbar vertebra, initial encounter for closed fracture: Secondary | ICD-10-CM | POA: Diagnosis present

## 2014-09-12 DIAGNOSIS — M109 Gout, unspecified: Secondary | ICD-10-CM | POA: Diagnosis present

## 2014-09-12 DIAGNOSIS — I10 Essential (primary) hypertension: Secondary | ICD-10-CM | POA: Diagnosis present

## 2014-09-12 DIAGNOSIS — F419 Anxiety disorder, unspecified: Secondary | ICD-10-CM | POA: Diagnosis present

## 2014-09-12 DIAGNOSIS — I674 Hypertensive encephalopathy: Secondary | ICD-10-CM | POA: Diagnosis present

## 2014-09-12 DIAGNOSIS — M549 Dorsalgia, unspecified: Secondary | ICD-10-CM | POA: Diagnosis present

## 2014-09-12 DIAGNOSIS — W19XXXA Unspecified fall, initial encounter: Secondary | ICD-10-CM | POA: Diagnosis present

## 2014-09-12 DIAGNOSIS — E785 Hyperlipidemia, unspecified: Secondary | ICD-10-CM | POA: Diagnosis present

## 2014-09-12 DIAGNOSIS — Z7982 Long term (current) use of aspirin: Secondary | ICD-10-CM

## 2014-09-12 DIAGNOSIS — R52 Pain, unspecified: Secondary | ICD-10-CM

## 2014-09-12 DIAGNOSIS — Z79899 Other long term (current) drug therapy: Secondary | ICD-10-CM

## 2014-09-12 DIAGNOSIS — S32000A Wedge compression fracture of unspecified lumbar vertebra, initial encounter for closed fracture: Secondary | ICD-10-CM

## 2014-09-12 DIAGNOSIS — Z888 Allergy status to other drugs, medicaments and biological substances status: Secondary | ICD-10-CM | POA: Diagnosis not present

## 2014-09-12 DIAGNOSIS — Z886 Allergy status to analgesic agent status: Secondary | ICD-10-CM

## 2014-09-12 LAB — CBC WITH DIFFERENTIAL/PLATELET
BASOS ABS: 0.3 10*3/uL — AB (ref 0–0.1)
Basophils Relative: 2 %
EOS PCT: 1 %
Eosinophils Absolute: 0.1 10*3/uL (ref 0–0.7)
HCT: 41.5 % (ref 35.0–47.0)
Hemoglobin: 13 g/dL (ref 12.0–16.0)
Lymphocytes Relative: 7 %
Lymphs Abs: 0.9 10*3/uL — ABNORMAL LOW (ref 1.0–3.6)
MCH: 27.6 pg (ref 26.0–34.0)
MCHC: 31.5 g/dL — AB (ref 32.0–36.0)
MCV: 87.7 fL (ref 80.0–100.0)
Monocytes Absolute: 1 10*3/uL — ABNORMAL HIGH (ref 0.2–0.9)
Monocytes Relative: 8 %
NEUTROS ABS: 10.6 10*3/uL — AB (ref 1.4–6.5)
Neutrophils Relative %: 82 %
PLATELETS: 354 10*3/uL (ref 150–440)
RBC: 4.73 MIL/uL (ref 3.80–5.20)
RDW: 15 % — ABNORMAL HIGH (ref 11.5–14.5)
WBC: 12.9 10*3/uL — AB (ref 3.6–11.0)

## 2014-09-12 LAB — BASIC METABOLIC PANEL
ANION GAP: 11 (ref 5–15)
BUN: 19 mg/dL (ref 6–20)
CO2: 28 mmol/L (ref 22–32)
Calcium: 8.7 mg/dL — ABNORMAL LOW (ref 8.9–10.3)
Chloride: 97 mmol/L — ABNORMAL LOW (ref 101–111)
Creatinine, Ser: 0.87 mg/dL (ref 0.44–1.00)
GFR calc Af Amer: >60 mL/min (ref >60)
GFR calc non Af Amer: 58 mL/min — ABNORMAL LOW (ref >60)
Glucose, Bld: 117 mg/dL — ABNORMAL HIGH (ref 65–99)
Potassium: 3.5 mmol/L (ref 3.5–5.1)
Sodium: 136 mmol/L (ref 135–145)

## 2014-09-12 LAB — URINALYSIS COMPLETE WITH MICROSCOPIC (ARMC ONLY)
Bilirubin Urine: NEGATIVE
GLUCOSE, UA: NEGATIVE mg/dL
HGB URINE DIPSTICK: NEGATIVE
Leukocytes, UA: NEGATIVE
NITRITE: NEGATIVE
PH: 6 (ref 5.0–8.0)
Protein, ur: 100 mg/dL — AB
SPECIFIC GRAVITY, URINE: 1.02 (ref 1.005–1.030)

## 2014-09-12 MED ORDER — DOCUSATE SODIUM 100 MG PO CAPS
100.0000 mg | ORAL_CAPSULE | Freq: Two times a day (BID) | ORAL | Status: DC
  Administered 2014-09-12 – 2014-09-18 (×10): 100 mg via ORAL
  Filled 2014-09-12 (×11): qty 1

## 2014-09-12 MED ORDER — METOPROLOL TARTRATE 25 MG PO TABS
25.0000 mg | ORAL_TABLET | Freq: Three times a day (TID) | ORAL | Status: DC
  Administered 2014-09-12 – 2014-09-14 (×7): 25 mg via ORAL
  Filled 2014-09-12 (×7): qty 1

## 2014-09-12 MED ORDER — OXYCODONE HCL 5 MG PO TABS
5.0000 mg | ORAL_TABLET | ORAL | Status: DC | PRN
  Administered 2014-09-13 – 2014-09-18 (×11): 5 mg via ORAL
  Filled 2014-09-12 (×12): qty 1

## 2014-09-12 MED ORDER — ONDANSETRON HCL 4 MG/2ML IJ SOLN: 4.0000 mg | Freq: Four times a day (QID) | INTRAMUSCULAR | Status: DC | PRN

## 2014-09-12 MED ORDER — ESCITALOPRAM OXALATE 10 MG PO TABS
20.0000 mg | ORAL_TABLET | Freq: Every day | ORAL | Status: DC
Start: 2014-09-13 — End: 2014-09-18
  Administered 2014-09-13 – 2014-09-18 (×6): 20 mg via ORAL
  Filled 2014-09-12 (×6): qty 2

## 2014-09-12 MED ORDER — SODIUM CHLORIDE 0.9 % IV BOLUS (SEPSIS)
1000.0000 mL | Freq: Once | INTRAVENOUS | Status: AC
  Administered 2014-09-12: 1000 mL via INTRAVENOUS

## 2014-09-12 MED ORDER — ONDANSETRON HCL 4 MG PO TABS: 4.0000 mg | ORAL_TABLET | Freq: Four times a day (QID) | ORAL | Status: DC | PRN

## 2014-09-12 MED ORDER — OXYBUTYNIN CHLORIDE ER 5 MG PO TB24
5.0000 mg | ORAL_TABLET | Freq: Every day | ORAL | Status: DC
  Administered 2014-09-13 – 2014-09-17 (×5): 5 mg via ORAL
  Filled 2014-09-12 (×8): qty 1

## 2014-09-12 MED ORDER — HEPARIN SODIUM (PORCINE) 5000 UNIT/ML IJ SOLN
5000.0000 [IU] | Freq: Three times a day (TID) | INTRAMUSCULAR | Status: DC
  Administered 2014-09-12 – 2014-09-18 (×17): 5000 [IU] via SUBCUTANEOUS
  Filled 2014-09-12 (×17): qty 1

## 2014-09-12 MED ORDER — ASPIRIN EC 81 MG PO TBEC
81.0000 mg | DELAYED_RELEASE_TABLET | Freq: Every day | ORAL | Status: DC
  Administered 2014-09-12 – 2014-09-17 (×6): 81 mg via ORAL
  Filled 2014-09-12 (×6): qty 1

## 2014-09-12 MED ORDER — ACETAMINOPHEN 325 MG PO TABS
650.0000 mg | ORAL_TABLET | Freq: Four times a day (QID) | ORAL | Status: DC | PRN
  Administered 2014-09-14: 650 mg via ORAL
  Filled 2014-09-12: qty 2

## 2014-09-12 MED ORDER — MORPHINE SULFATE 2 MG/ML IJ SOLN
2.0000 mg | INTRAMUSCULAR | Status: DC | PRN
  Administered 2014-09-17: 2 mg via INTRAVENOUS
  Filled 2014-09-12: qty 1

## 2014-09-12 MED ORDER — ACETAMINOPHEN 650 MG RE SUPP: 650.0000 mg | Freq: Four times a day (QID) | RECTAL | Status: DC | PRN

## 2014-09-12 MED ORDER — TRAMADOL HCL 50 MG PO TABS
25.0000 mg | ORAL_TABLET | Freq: Four times a day (QID) | ORAL | Status: DC | PRN
  Administered 2014-09-13 – 2014-09-15 (×2): 50 mg via ORAL
  Filled 2014-09-12: qty 1
  Filled 2014-09-12: qty 2

## 2014-09-12 MED ORDER — SIMVASTATIN 20 MG PO TABS
20.0000 mg | ORAL_TABLET | Freq: Every day | ORAL | Status: DC
  Administered 2014-09-12 – 2014-09-17 (×6): 20 mg via ORAL
  Filled 2014-09-12 (×6): qty 1

## 2014-09-12 MED ORDER — LEVOTHYROXINE SODIUM 100 MCG PO TABS
100.0000 ug | ORAL_TABLET | Freq: Every day | ORAL | Status: DC
  Administered 2014-09-13 – 2014-09-18 (×5): 100 ug via ORAL
  Filled 2014-09-12 (×5): qty 1

## 2014-09-12 NOTE — ED Provider Notes (Signed)
[facility] Emergency Department Provider Note  ____________________________________________  Time seen: Approximately 4:14 PM  I have reviewed the triage vital signs and the nursing notes.   HISTORY  Chief Complaint Back Pain    HPI Andrea Schaefer is a 81 y.o. female who along with her son gives me the history. The history is that she was doing something at home. Over balance and sat on her bottom and then laid back. That happened about 2 weeks ago she sat down hard and began having pain in her pelvis and low back. She went to see Dr. Reuel Boom office saw the PA. Had x-rays done and was given Ultram to take half pill 4 times a day. The pain has continued possibly worsen. She was here on the 23rd. Had pelvis x-rays done and these were negative. Pain continues from the low back in the straddle area she describes it radiating down the left leg toward the knee. Pain is worse with trying to sit up or if I lift the leg. Patient has been very sleepy from the pain medicine has been laying in bed all day long and not eating or drinking much.   Past Medical History  Diagnosis Date  . Hypertension   . Anxiety   . Gout   . Thyroid disease   . Hypothyroidism     Patient Active Problem List   Diagnosis Date Noted  . Compression fracture of L3 lumbar vertebra 09/12/2014  . Intractable back pain 09/12/2014    History reviewed. No pertinent past surgical history.  No current outpatient prescriptions on file.  Allergies Codeine; Tetracyclines & related; Adhesive; Keflex; and Naproxen  Family History  Problem Relation Age of Onset  . Hypertension Other     Social History History  Substance Use Topics  . Smoking status: Never Smoker   . Smokeless tobacco: Not on file  . Alcohol Use: No    Review of Systems Constitutional: No fever/chills Eyes: No visual changes. ENT: No sore throat. Cardiovascular: Denies chest pain. Respiratory: Denies shortness of  breath. Gastrointestinal: No abdominal pain.  No nausea, no vomiting.  No diarrhea.  No constipation. Genitourinary: Negative for dysuria. Musculoskeletal: See history of present illness. Skin: Negative for rash. Neurological: Negative for headaches, focal weakness or numbness. No difficulty with urination that is new patient's been wearing a diaper for many years  10-point ROS otherwise negative.  ____________________________________________   PHYSICAL EXAM:  VITAL SIGNS: ED Triage Vitals  Enc Vitals Group     BP 09/12/14 1520 184/89 mmHg     Pulse Rate 09/12/14 1520 75     Resp --      Temp 09/12/14 1520 98.5 F (36.9 C)     Temp Source 09/12/14 1520 Oral     SpO2 09/12/14 1516 94 %     Weight 09/12/14 1520 184 lb (83.462 kg)     Height 09/12/14 1520 5\' 5"  (1.651 m)     Head Cir --      Peak Flow --      Pain Score 09/12/14 1521 6     Pain Loc --      Pain Edu? --      Excl. in [place]? --     Constitutional: Alert and oriented. Well appearing and in no acute distress. Eyes: Conjunctivae are normal. PERRL. EOMI. Head: Atraumatic. Nose: No congestion/rhinnorhea. Mouth/Throat: Mucous membranes are moist.  Oropharynx non-erythematous. Neck: No stridor. Cardiovascular: Normal rate, regular rhythm. Grossly normal heart sounds.  Good  peripheral circulation. Respiratory: Normal respiratory effort.  No retractions. Lungs CTAB. Gastrointestinal: Soft and nontender. No distention. No abdominal bruits. No CVA tenderness. Musculoskeletal: No lower extremity tenderness nor edema.  No joint effusions. Neurologic:  Normal speech and language. No gross focal neurologic deficits are appreciated.  Skin:  Skin is warm, dry and intact. No rash noted. Psychiatric: Mood and affect are normal. Speech and behavior are normal. No back pain on palpation of the back. There is a little bit of palpation over the SI joints bilaterally but is very mild. Patient's pain is exacerbated when she sits up and  with left leg straight leg raise. This reproduces the pain in her low back radiating down into her left leg.  ____________________________________________   LABS (all labs ordered are listed, but only abnormal results are displayed)  Labs Reviewed  BASIC METABOLIC PANEL - Abnormal; Notable for the following:    Chloride 97 (*)    Glucose, Bld 117 (*)    Calcium 8.7 (*)    GFR calc non Af Amer 58 (*)    All other components within normal limits  CBC WITH DIFFERENTIAL/PLATELET - Abnormal; Notable for the following:    WBC 12.9 (*)    MCHC 31.5 (*)    RDW 15.0 (*)    Neutro Abs 10.6 (*)    Lymphs Abs 0.9 (*)    Monocytes Absolute 1.0 (*)    Basophils Absolute 0.3 (*)    All other components within normal limits  URINALYSIS COMPLETEWITH MICROSCOPIC ([facility] ONLY) - Abnormal; Notable for the following:    Color, Urine YELLOW (*)    APPearance CLEAR (*)    Ketones, ur TRACE (*)    Protein, ur 100 (*)    Bacteria, UA RARE (*)    Squamous Epithelial / LPF 0-5 (*)    All other components within normal limits   ____________________________________________  EKG   ____________________________________________  RADIOLOGY  Radiology report obtain from office by fax. This shows L3 compression fracture. CT was ordered and done CT showed potentially unstable L3 compression fracture with involvement of both pedicles. Dr. Marry Guan was contacted reviewed the films advised me to talk to neurosurgeon neurosurgeon at [facility] was also contacted and looked at the films. He advised putting the patient Alice {. Following up in 2 weeks is now patient. I'm unable to get this brace tonight. Also the patient has not been getting up out of bed and eating or drinking much because the pain. We'll therefore admit her overnight for pain control and get the brace done in the morning hopefully. Follow-up with Dr. Sherley Bounds in the office in about 2  weeks. ____________________________________________   PROCEDURES    ____________________________________________   INITIAL IMPRESSION / ASSESSMENT AND PLAN / ED COURSE  Pertinent labs & imaging results that were available during my care of the patient were reviewed by me and considered in my medical decision making (see chart for details).  Son asked to recheck blood work and urine to make sure she is not getting dehydrated because he believes she is not getting up to eat or drink much at all ____________________________________________   FINAL CLINICAL IMPRESSION(S) / ED DIAGNOSES  Final diagnoses:  Lumbar compression fracture, closed, initial encounter  Intractable pain      Nena Polio, MD 09/12/14 2318

## 2014-09-12 NOTE — ED Notes (Signed)
Pt here a week ago for a fall, c/o of back pain, D/C home was to follow up with PMD today, per EMS family stated that pt was in too much pain to be placed in car for appt with PMD.

## 2014-09-12 NOTE — H&P (Signed)
[place] [facility] [place] [name]    MR#:  [id number]  DATE OF BIRTH:  Jun 22, 1924   DATE OF ADMISSION:  09/12/2014  PRIMARY CARE PHYSICIAN: [name], MD   REQUESTING/REFERRING PHYSICIAN: Malinda  CHIEF COMPLAINT:   Chief Complaint  Patient presents with  . Back Pain    HISTORY OF PRESENT ILLNESS:  Andrea Schaefer  is a 81 y.o. female with a known history of hypothyroidism unspecified, hyperlipidemia unspecified presenting with back pain. Septic mechanical fall but she weeks ago and since that time as an experiencing back pain in the lumbar region "pain" and quality range between 2 and 10 intensity worse with movements some relief with rest radiation down the left leg no further symptomatology no issues with constipation bout control or urine symptoms. No issues with lower extremity weakness or paresthesias she however does experience pain when moving her legs. Her thyroid in emergency department found to have a atypical burst/compression fracture of L3 case discussed with orthopedic surgery here as well as neurosurgery, who recommended LSO bracing and follow-up with outpatient care of Dr. Sherley Bounds in 2-3 weeks' time period. However she still complained of pain and nonlabored (time.  PAST MEDICAL HISTORY:   Past Medical History  Diagnosis Date  . Hypertension   . Anxiety   . Gout   . Thyroid disease   . Hypothyroidism     PAST SURGICAL HISTORY:  History reviewed. No pertinent past surgical history.  SOCIAL HISTORY:   History  Substance Use Topics  . Smoking status: Never Smoker   . Smokeless tobacco: Not on file  . Alcohol Use: No    FAMILY HISTORY:   Family History  Problem Relation Age of Onset  . Hypertension Other     DRUG ALLERGIES:   Allergies  Allergen Reactions  . Codeine Nausea And Vomiting  . Tetracyclines & Related Nausea And Vomiting  . Adhesive [Tape] Rash  . Keflex [Cephalexin] Rash   . Naproxen Rash    REVIEW OF SYSTEMS:  REVIEW OF SYSTEMS:  CONSTITUTIONAL: Denies fevers, chills, fatigue, weakness.  EYES: Denies blurred vision, double vision, or eye pain.  EARS, NOSE, THROAT: Denies tinnitus, ear pain, hearing loss.  RESPIRATORY: denies cough, shortness of breath, wheezing  CARDIOVASCULAR: Denies chest pain, palpitations, edema.  GASTROINTESTINAL: Denies nausea, vomiting, diarrhea, abdominal pain.  GENITOURINARY: Denies dysuria, hematuria.  ENDOCRINE: Denies nocturia or thyroid problems. HEMATOLOGIC AND LYMPHATIC: Denies easy bruising or bleeding.  SKIN: Denies rash or lesions.  MUSCULOSKELETAL: Denies pain in neck, shoulder, knees, hips, or further arthritic symptoms. Positive back pain NEUROLOGIC: Denies paralysis, paresthesias.  PSYCHIATRIC: Denies anxiety or depressive symptoms. Otherwise full review of systems performed by me is negative.   MEDICATIONS AT HOME:   Prior to Admission medications   Medication Sig Start Date End Date Taking? Authorizing Provider  aspirin EC 81 MG tablet Take 81 mg by mouth at bedtime.    Yes Historical Provider, MD  escitalopram (LEXAPRO) 20 MG tablet Take 20 mg by mouth daily.   Yes Historical Provider, MD  levothyroxine (SYNTHROID, LEVOTHROID) 100 MCG tablet Take 100 mcg by mouth daily before breakfast.   Yes Historical Provider, MD  metoprolol tartrate (LOPRESSOR) 25 MG tablet Take 25 mg by mouth 3 (three) times daily.   Yes Historical Provider, MD  simvastatin (ZOCOR) 20 MG tablet Take 20 mg by mouth at bedtime.   Yes Historical Provider, MD  tolterodine (DETROL) 1 MG tablet Take  1 mg by mouth 2 (two) times daily.   Yes Historical Provider, MD  traMADol (ULTRAM) 50 MG tablet Take 25-50 mg by mouth every 6 (six) hours as needed for moderate pain.    Yes Historical Provider, MD      VITAL SIGNS:  Blood pressure 154/98, pulse 90, temperature 98.5 F (36.9 C), temperature source Oral, resp. rate 19, height 5\' 5"  (1.651 m),  weight 184 lb (83.462 kg), SpO2 95 %.  PHYSICAL EXAMINATION:  VITAL SIGNS: Filed Vitals:   09/12/14 2035  BP: 154/98  Pulse: 90  Temp:   Resp: 58   GENERAL:81 y.o.female currently in no acute distress.  HEAD: Normocephalic, atraumatic.  EYES: Pupils equal, round, reactive to light. Extraocular muscles intact. No scleral icterus.  MOUTH: Moist mucosal membrane. Dentition intact. No abscess noted.  EAR, NOSE, THROAT: Clear without exudates. No external lesions.  NECK: Supple. No thyromegaly. No nodules. No JVD.  PULMONARY: Clear to ascultation, without wheeze rails or rhonci. No use of accessory muscles, Good respiratory effort. good air entry bilaterally CHEST: Nontender to palpation.  CARDIOVASCULAR: S1 and S2. Regular rate and rhythm. No murmurs, rubs, or gallops. No edema. Pedal pulses 2+ bilaterally.  GASTROINTESTINAL: Soft, nontender, nondistended. No masses. Positive bowel sounds. No hepatosplenomegaly.  MUSCULOSKELETAL: No swelling, clubbing, or edema. Range of motion limited with proximal lower extremity flexion secondary to pain NEUROLOGIC: Cranial nerves II through XII are intact. No gross focal neurological deficits. Sensation intact. Reflexes intact.  SKIN: No ulceration, lesions, rashes, or cyanosis. Skin warm and dry. Turgor intact.  PSYCHIATRIC: Mood, affect within normal limits. The patient is awake, alert and oriented x 3. Insight, judgment intact.    LABORATORY PANEL:   CBC  Recent Labs Lab 09/12/14 1640  WBC 12.9*  HGB 13.0  HCT 41.5  PLT 354   ------------------------------------------------------------------------------------------------------------------  Chemistries   Recent Labs Lab 09/06/14 0340 09/12/14 1640  NA 138 136  K 3.3* 3.5  CL 102 97*  CO2 27 28  GLUCOSE 113* 117*  BUN 20 19  CREATININE 0.90 0.87  CALCIUM 8.6* 8.7*  AST 18  --   ALT 11*  --   ALKPHOS 73  --   BILITOT 0.6  --     ------------------------------------------------------------------------------------------------------------------  Cardiac Enzymes  Recent Labs Lab 09/06/14 0340  TROPONINI <0.03   ------------------------------------------------------------------------------------------------------------------  RADIOLOGY:  Ct Lumbar Spine Wo Contrast  09/12/2014   CLINICAL DATA:  Lumbago/low back pain. Abnormal lumbar spine radiographs.  EXAM: CT LUMBAR SPINE WITHOUT CONTRAST  TECHNIQUE: Multidetector CT imaging of the lumbar spine was performed without intravenous contrast administration. Multiplanar CT image reconstructions were also generated.  COMPARISON:  Prior films not available for comparison.  FINDINGS: Segmentation: 5 lumbar type vertebral bodies are present. Sacralization of L5 which is fused to the sacrum.  Alignment: Straightening of the normal lumbar lordosis. Mild levoconvex curve with the apex at L2-L3.  Vertebrae: No destructive osseous lesions are present. There is a chronic L2 compression fracture with 75% loss of central vertebral body height and retropulsion.  There is an acute or subacute L3 superior endplate compression fracture with retropulsion. Loss of vertebral body height is about 25%. The fracture margins remain visible. The L3 fracture is more severe than typical compression fractures and fracture planes extend into the pedicles bilaterally, best seen on sagittal images. The pedicle fractures are atypical because of abnormal mechanics at L3.  L4 and L5 as well as L1 show preserved vertebral body height. The visible thoracic spine is  also preserved.  Paraspinal tissues: LEFT pleural effusion is present, partially visible. Aortoiliac atherosclerosis. LEFT upper pole renal cystic lesion compatible with cyst. There appears to be a dilated multi cystic kidney in the RIGHT retroperitoneum, with partially visible calculus. No dilated RIGHT ureter is identified.  Disc levels:  T9-T10:   Negative.  T10-T11:  Negative.  T11-T12: LEFT facet arthrosis. Mild central stenosis. Shallow LEFT eccentric calcified disc bulge.  T12-L1:  Mild RIGHT facet arthrosis.  No stenosis.  L1-L2:  Bilateral facet arthrosis.  Mild central stenosis.  Retropulsion at L2 produces moderate central canal stenosis.  L2-L3: Moderate to severe multifactorial central stenosis. Retropulsion and facet arthrosis with ligamentum flavum redundancy contribute to the stenosis. Ankylosis of the L2-L3 facet joints is present resulting in abnormal mechanics at the fracture below.  L3: Moderate to severe central stenosis associated with retropulsion of L3. Pedicle fractures are visible on the axial imaging.  L3-L4: Mild bilateral foraminal stenosis associated with facet arthrosis, and disc bulging. Mild to moderate central stenosis.  L4-L5: Severe bilateral facet arthrosis. Ankylosis of the LEFT facet joint. There is severe multifactorial central stenosis with bilateral subarticular stenosis and LEFT-greater-than-RIGHT foraminal stenosis.  L5-S1:  Negative.  IMPRESSION: 1. Acute or subacute atypical L3 compression/burst fracture. Although loss of vertebral body height is mild at 25%, the fracture extends into the base of the pedicles bilaterally. This is probably an unstable fracture given the dissociation of the vertebral body from the posterior elements and involvement of two columns. Moderate to severe central stenosis associated with retropulsion of L3. 2. Chronic L2 compression fracture with retropulsion resulting in moderate to severe central stenosis. 3. Ankylosis of the L2-L3 facet joints resulting in abnormal mechanics that likely produce the atypical fracture at L3. 4. Severe lumbar spine degenerative disease with chronic severe central stenosis at L4-L5. 5. There is lumbosacral transitional anatomy. This report assumes that there are 5 lumbar type vertebral bodies. Recommend close correlation with radiographs if intervention is  elected. 6. Enlarged multi cystic RIGHT kidney. Correlation with history is recommended to determine whether this is a new finding. Consider follow-up urology consultation.   Electronically Signed   By: Dereck Ligas M.D.   On: 09/12/2014 17:29   Dg Chest Portable 1 View  09/12/2014   CLINICAL DATA:  Weakness  EXAM: PORTABLE CHEST - 1 VIEW  COMPARISON:  10/28/2013  FINDINGS: Left effusion and left lower lobe atelectasis is similar to the prior study. Cardiac enlargement. Mild vascular congestion without edema. Right lung clear.  IMPRESSION: Left effusion and left lower lobe atelectasis. Possibly due to fluid overload and heart failure.   Electronically Signed   By: Franchot Gallo M.D.   On: 09/12/2014 16:08    EKG:   Orders placed or performed in visit on 02/20/14  . EKG 12-Lead    IMPRESSION AND PLAN:   81 year old Caucasian female history of hypothyroidism unspecified presenting with back pain.  1. Atypical compression/burst fracture L3: Consult orthopedics, pain control patient will need LSO bracing follow-up with Dr. Sherley Bounds as an outpatient provide bowel regiment as well 2. Hypothyroidism unspecified: Synthroid Hypertension essential: Lopressor Hyperlipidemia unspecified Zocor Venous thromboembolism prophylactic heparin subcutaneous    All the records are reviewed and case discussed with ED provider. Management plans discussed with the patient, family and they are in agreement.  CODE STATUS: Full  TOTAL TIME TAKING CARE OF THIS PATIENT: 35 minutes.    Christerpher Clos,  Karenann Cai.D on 09/12/2014 at 8:48 PM  Between 7am to  6pm - Pager - [phone]  After 6pm: House Pager: - [phone]  [facility]  Office  [phone]  CC: Primary care physician; [name], MD

## 2014-09-13 NOTE — Progress Notes (Signed)
[place] [facility] [place] [name]    MR#:  [id number]  DATE OF BIRTH:  12-08-1924  SUBJECTIVE:  CHIEF COMPLAINT:   Chief Complaint  Patient presents with  . Back Pain   Still back pain, exacerbated by movement. REVIEW OF SYSTEMS:  CONSTITUTIONAL: No fever, fatigue or weakness.  EYES: No blurred or double vision.  EARS, NOSE, AND THROAT: No tinnitus or ear pain.  RESPIRATORY: No cough, shortness of breath, wheezing or hemoptysis.  CARDIOVASCULAR: No chest pain, orthopnea, edema.  GASTROINTESTINAL: No nausea, vomiting, diarrhea or abdominal pain.  GENITOURINARY: No dysuria, hematuria.  ENDOCRINE: No polyuria, nocturia,  HEMATOLOGY: No anemia, easy bruising or bleeding SKIN: No rash or lesion. MUSCULOSKELETAL: back pain.  NEUROLOGIC: No tingling, numbness, weakness.  PSYCHIATRY: No anxiety or depression.   DRUG ALLERGIES:   Allergies  Allergen Reactions  . Codeine Nausea And Vomiting  . Tetracyclines & Related Nausea And Vomiting  . Adhesive [Tape] Rash  . Keflex [Cephalexin] Rash  . Naproxen Rash    VITALS:  Blood pressure 165/68, pulse 68, temperature 97.7 F (36.5 C), temperature source Oral, resp. rate 18, height 5\' 5"  (1.651 m), weight 78.019 kg (172 lb), SpO2 92 %.  PHYSICAL EXAMINATION:  GENERAL:  81 y.o.-year-old patientient lying in the bed with no acute distress.  EYES: Pupils equal, round, reactive to light and accommodation. No scleral icterus. Extraocular muscles intact.  HEENT: Head atraumatic, normocephalic. Oropharynx and nasopharynx clear.  NECK:  Supple, no jugular venous distention. No thyroid enlargement, no tenderness.  LUNGS: Normal breath sounds bilaterally, no wheezing, rales,rhonchi or crepitation. No use of accessory muscles of respiration.  CARDIOVASCULAR: S1, S2 normal. No murmurs, rubs, or gallops.  ABDOMEN: Soft, nontender, nondistended. Bowel sounds present. No organomegaly or mass.   EXTREMITIES: No pedal edema, cyanosis, or clubbing.  NEUROLOGIC: Cranial nerves II through XII are intact. Muscle strength 3/5 in all extremities. Sensation intact. Gait not checked.  PSYCHIATRIC: The patient is alert and oriented x 3.  SKIN: No obvious rash, lesion, or ulcer.    LABORATORY PANEL:   CBC  Recent Labs Lab 09/12/14 1640  WBC 12.9*  HGB 13.0  HCT 41.5  PLT 354   ------------------------------------------------------------------------------------------------------------------  Chemistries   Recent Labs Lab 09/12/14 1640  NA 136  K 3.5  CL 97*  CO2 28  GLUCOSE 117*  BUN 19  CREATININE 0.87  CALCIUM 8.7*   ------------------------------------------------------------------------------------------------------------------  Cardiac Enzymes No results for input(s): TROPONINI in the last 168 hours. ------------------------------------------------------------------------------------------------------------------  RADIOLOGY:  Ct Lumbar Spine Wo Contrast  09/12/2014   CLINICAL DATA:  Lumbago/low back pain. Abnormal lumbar spine radiographs.  EXAM: CT LUMBAR SPINE WITHOUT CONTRAST  TECHNIQUE: Multidetector CT imaging of the lumbar spine was performed without intravenous contrast administration. Multiplanar CT image reconstructions were also generated.  COMPARISON:  Prior films not available for comparison.  FINDINGS: Segmentation: 5 lumbar type vertebral bodies are present. Sacralization of L5 which is fused to the sacrum.  Alignment: Straightening of the normal lumbar lordosis. Mild levoconvex curve with the apex at L2-L3.  Vertebrae: No destructive osseous lesions are present. There is a chronic L2 compression fracture with 75% loss of central vertebral body height and retropulsion.  There is an acute or subacute L3 superior endplate compression fracture with retropulsion. Loss of vertebral body height is about 25%. The fracture margins remain visible. The L3 fracture is  more severe than typical compression fractures and fracture planes extend into the  pedicles bilaterally, best seen on sagittal images. The pedicle fractures are atypical because of abnormal mechanics at L3.  L4 and L5 as well as L1 show preserved vertebral body height. The visible thoracic spine is also preserved.  Paraspinal tissues: LEFT pleural effusion is present, partially visible. Aortoiliac atherosclerosis. LEFT upper pole renal cystic lesion compatible with cyst. There appears to be a dilated multi cystic kidney in the RIGHT retroperitoneum, with partially visible calculus. No dilated RIGHT ureter is identified.  Disc levels:  T9-T10:  Negative.  T10-T11:  Negative.  T11-T12: LEFT facet arthrosis. Mild central stenosis. Shallow LEFT eccentric calcified disc bulge.  T12-L1:  Mild RIGHT facet arthrosis.  No stenosis.  L1-L2:  Bilateral facet arthrosis.  Mild central stenosis.  Retropulsion at L2 produces moderate central canal stenosis.  L2-L3: Moderate to severe multifactorial central stenosis. Retropulsion and facet arthrosis with ligamentum flavum redundancy contribute to the stenosis. Ankylosis of the L2-L3 facet joints is present resulting in abnormal mechanics at the fracture below.  L3: Moderate to severe central stenosis associated with retropulsion of L3. Pedicle fractures are visible on the axial imaging.  L3-L4: Mild bilateral foraminal stenosis associated with facet arthrosis, and disc bulging. Mild to moderate central stenosis.  L4-L5: Severe bilateral facet arthrosis. Ankylosis of the LEFT facet joint. There is severe multifactorial central stenosis with bilateral subarticular stenosis and LEFT-greater-than-RIGHT foraminal stenosis.  L5-S1:  Negative.  IMPRESSION: 1. Acute or subacute atypical L3 compression/burst fracture. Although loss of vertebral body height is mild at 25%, the fracture extends into the base of the pedicles bilaterally. This is probably an unstable fracture given the  dissociation of the vertebral body from the posterior elements and involvement of two columns. Moderate to severe central stenosis associated with retropulsion of L3. 2. Chronic L2 compression fracture with retropulsion resulting in moderate to severe central stenosis. 3. Ankylosis of the L2-L3 facet joints resulting in abnormal mechanics that likely produce the atypical fracture at L3. 4. Severe lumbar spine degenerative disease with chronic severe central stenosis at L4-L5. 5. There is lumbosacral transitional anatomy. This report assumes that there are 5 lumbar type vertebral bodies. Recommend close correlation with radiographs if intervention is elected. 6. Enlarged multi cystic RIGHT kidney. Correlation with history is recommended to determine whether this is a new finding. Consider follow-up urology consultation.   Electronically Signed   By: Dereck Ligas M.D.   On: 09/12/2014 17:29   Dg Chest Portable 1 View  09/12/2014   CLINICAL DATA:  Weakness  EXAM: PORTABLE CHEST - 1 VIEW  COMPARISON:  10/28/2013  FINDINGS: Left effusion and left lower lobe atelectasis is similar to the prior study. Cardiac enlargement. Mild vascular congestion without edema. Right lung clear.  IMPRESSION: Left effusion and left lower lobe atelectasis. Possibly due to fluid overload and heart failure.   Electronically Signed   By: Franchot Gallo M.D.   On: 09/12/2014 16:08    EKG:   Orders placed or performed in visit on 02/20/14  . EKG 12-Lead    ASSESSMENT AND PLAN:   1. Atypical compression/burst fracture L3:  F/u orthopedics for LSO bracing, pain control. PT.  2. Hypothyroidism unspecified: Synthroid  Hypertension essential: Lopressor Hyperlipidemia unspecified Zocor   Discussed with Dr. Page Spiro, ortho.  All the records are reviewed and case discussed with Care Management/Social Workerr. Management plans discussed with the patient, family and they are in agreement.  CODE STATUS: Full code  TOTAL TIME  TAKING CARE OF THIS PATIENT: 39 minutes.  POSSIBLE D/C IN 2 DAYS, DEPENDING ON CLINICAL CONDITION.   Demetrios Loll M.D on 09/13/2014 at 1:45 PM  Between 7am to 6pm - Pager - [phone]  After 6pm go to [email or web address] - password [email or web address] [facility]  Office  [phone]  CC: Primary care physician; [name], MD

## 2014-09-13 NOTE — Plan of Care (Signed)
Problem: Phase I Progression Outcomes Goal: Hemodynamically stable Not on IV fluids

## 2014-09-13 NOTE — Plan of Care (Signed)
Problem: Phase I Progression Outcomes Goal: Pain controlled with appropriate interventions Outcome: Completed/Met Date Met:  09/13/14 No complaints of pain during the night

## 2014-09-13 NOTE — Progress Notes (Signed)
Pt alert but does have some forgetfulness. NO complaints of pain during the night. States that she only hurts when moving or ambulating. Incontinent of urine during the night.

## 2014-09-13 NOTE — Progress Notes (Signed)
Spoke with Dr. Marcille Blanco regarding elevated blood pressure. No new orders at this time. Call back if blood pressure greater than 165 systolic.

## 2014-09-13 NOTE — Evaluation (Signed)
Physical Therapy Evaluation Patient Details [name] MRN: [id number] DOB: December 19, 1924 Today's Date: 09/13/2014   History of Present Illness  Pt had a fall 2 weeks ago, has had gradually increased back pain that is now intractable  Clinical Impression  Pt with very limited mobility secondary to debilitating pain.  She shows good effort (though she needs much encouragement to even participate secondary to pain) but is very, very limited.  She is not at all safe to go home and is not even able to take 1 forward step today.  Pt had been able to do some limited walking after the initial fall, but has just had more and more pain to the point where she has essentially been bed bound for a week.    Follow Up Recommendations SNF    Equipment Recommendations       Recommendations for Other Services       Precautions / Restrictions Precautions Precautions: Fall Required Braces or Orthoses: Spinal Brace Restrictions Weight Bearing Restrictions: No      Mobility  Bed Mobility Overal bed mobility: Needs Assistance Bed Mobility: Supine to Sit;Sit to Supine     Supine to sit: Mod assist Sit to supine: Max assist;Mod assist   General bed mobility comments: Pt very pain limited,   Transfers Overall transfer level: Needs assistance Equipment used: Rolling walker (2 wheeled) Transfers: Sit to/from Stand Sit to Stand: Min assist         General transfer comment: Pt very hesitant to stand and needs heavy UE assist on walker to get to upright and to maintain balance  Ambulation/Gait             General Gait Details: Pt unable to ambualte, but does take ~3 side shuffle steps at EOB with mod assist and much encouragement  Stairs            Wheelchair Mobility    Modified Rankin (Stroke Patients Only)       Balance                                             Pertinent Vitals/Pain Pain Assessment: 0-10 Pain Score: 10-Worst pain ever Pain  Location: "every where" but focused at lumbo-sacral junction    Home Living Family/patient expects to be discharged to:: Skilled nursing facility Living Arrangements: Children Available Help at Discharge: Family                  Prior Function Level of Independence: Independent with assistive device(s)               Hand Dominance        Extremity/Trunk Assessment   Upper Extremity Assessment: Generalized weakness           Lower Extremity Assessment: Generalized weakness (pain limited, R worse than L )         Communication   Communication: No difficulties  Cognition Arousal/Alertness: Awake/alert Behavior During Therapy: Restless;Agitated;Anxious Overall Cognitive Status: Within Functional Limits for tasks assessed                      General Comments      Exercises        Assessment/Plan    PT Assessment Patient needs continued PT services  PT Diagnosis Difficulty walking;Generalized weakness   PT Problem List Decreased strength;Decreased activity tolerance;Pain;Decreased  balance;Decreased mobility  PT Treatment Interventions Gait training;Therapeutic activities;Therapeutic exercise;Balance training   PT Goals (Current goals can be found in the Care Plan section) Acute Rehab PT Goals Patient Stated Goal: "Need to get rid of this terrible pain" PT Goal Formulation: With patient/family Time For Goal Achievement: 09/27/14 Potential to Achieve Goals: Fair    Frequency Min 2X/week   Barriers to discharge        Co-evaluation               End of Session Equipment Utilized During Treatment: Gait belt Activity Tolerance: Patient limited by pain Patient left: with bed alarm set      Functional Assessment Tool Used: clinical judgement Functional Limitation: Mobility: Walking and moving around Mobility: Walking and Moving Around Current Status [phone]): At least 80 percent but less than 100 percent impaired, limited or  restricted Mobility: Walking and Moving Around Goal Status [phone]): At least 20 percent but less than 40 percent impaired, limited or restricted    Time: 9892-1194 PT Time Calculation (min) (ACUTE ONLY): 19 min   Charges:   PT Evaluation $Initial PT Evaluation Tier I: 1 Procedure     PT G Codes:   PT G-Codes **NOT FOR INPATIENT CLASS** Functional Assessment Tool Used: clinical judgement Functional Limitation: Mobility: Walking and moving around Mobility: Walking and Moving Around Current Status (R7408): At least 80 percent but less than 100 percent impaired, limited or restricted Mobility: Walking and Moving Around Goal Status [phone]): At least 20 percent but less than 40 percent impaired, limited or restricted   Wayne Both, PT, DPT [phone]  Kreg Shropshire 09/13/2014, 5:58 PM

## 2014-09-13 NOTE — Consult Note (Signed)
ORTHOPAEDIC CONSULTATION  PATIENT [name] DOB: [birth year]  MRN: [id number]  REQUESTING PHYSICIAN: Demetrios Loll, MD  Chief Complaint: Back pain  HPI: Andrea Schaefer is a 81 y.o. female seen in consultation at the request of Demetrios Loll, MD for assistance with obtaining an LSO brace. The patient was in her usual state of health when she fell onto her buttocks 2 weeks ago in her home. At that time she had low back pain. She was evaluated and x-rays were negative for acute fracture after the injury. She was able to ambulate. She returned to the emergency department 7/23 due to buttock pain and dehydration. She was discharged from the emergency department. The patient and her son state that over the last several days she has had increasing back pain radiating down the lateral aspect of the left hip, left calf, and into the left foot. She has no difficulty urinating or changes in urinary patterns. She has no saddle anesthesia. She was evaluated in the emergency department yesterday evening where a CT scan was performed. She has a chronic L2 compression fracture on the CT scan. Per review of Epic notes, the emergency department staff spoke with Dr. Sherley Bounds of neurosurgery and [facility] who reviewed the images and recommended application of an LSO brace with outpatient follow-up.  Past Medical History  Diagnosis Date  . Hypertension   . Anxiety   . Gout   . Thyroid disease   . Hypothyroidism    History reviewed. No pertinent past surgical history. History   Social History  . Marital Status: Widowed    Spouse Name: N/A  . Number of Children: N/A  . Years of Education: N/A   Social History Main Topics  . Smoking status: Never Smoker   . Smokeless tobacco: Not on file  . Alcohol Use: No  . Drug Use: Not on file  . Sexual Activity: Not on file   Other Topics Concern  . None   Social History Narrative   Family History  Problem Relation Age of Onset  . Hypertension Other     Allergies  Allergen Reactions  . Codeine Nausea And Vomiting  . Tetracyclines & Related Nausea And Vomiting  . Adhesive [Tape] Rash  . Keflex [Cephalexin] Rash  . Naproxen Rash   Prior to Admission medications   Medication Sig Start Date End Date Taking? Authorizing Provider  aspirin EC 81 MG tablet Take 81 mg by mouth at bedtime.    Yes Historical Provider, MD  escitalopram (LEXAPRO) 20 MG tablet Take 20 mg by mouth daily.   Yes Historical Provider, MD  levothyroxine (SYNTHROID, LEVOTHROID) 100 MCG tablet Take 100 mcg by mouth daily before breakfast.   Yes Historical Provider, MD  metoprolol tartrate (LOPRESSOR) 25 MG tablet Take 25 mg by mouth 3 (three) times daily.   Yes Historical Provider, MD  simvastatin (ZOCOR) 20 MG tablet Take 20 mg by mouth at bedtime.   Yes Historical Provider, MD  tolterodine (DETROL) 1 MG tablet Take 1 mg by mouth 2 (two) times daily.   Yes Historical Provider, MD  traMADol (ULTRAM) 50 MG tablet Take 25-50 mg by mouth every 6 (six) hours as needed for moderate pain.    Yes Historical Provider, MD   Ct Lumbar Spine Wo Contrast  09/12/2014   CLINICAL DATA:  Lumbago/low back pain. Abnormal lumbar spine radiographs.  EXAM: CT LUMBAR SPINE WITHOUT CONTRAST  TECHNIQUE: Multidetector CT imaging of the lumbar spine was performed without intravenous contrast  administration. Multiplanar CT image reconstructions were also generated.  COMPARISON:  Prior films not available for comparison.  FINDINGS: Segmentation: 5 lumbar type vertebral bodies are present. Sacralization of L5 which is fused to the sacrum.  Alignment: Straightening of the normal lumbar lordosis. Mild levoconvex curve with the apex at L2-L3.  Vertebrae: No destructive osseous lesions are present. There is a chronic L2 compression fracture with 75% loss of central vertebral body height and retropulsion.  There is an acute or subacute L3 superior endplate compression fracture with retropulsion. Loss of vertebral  body height is about 25%. The fracture margins remain visible. The L3 fracture is more severe than typical compression fractures and fracture planes extend into the pedicles bilaterally, best seen on sagittal images. The pedicle fractures are atypical because of abnormal mechanics at L3.  L4 and L5 as well as L1 show preserved vertebral body height. The visible thoracic spine is also preserved.  Paraspinal tissues: LEFT pleural effusion is present, partially visible. Aortoiliac atherosclerosis. LEFT upper pole renal cystic lesion compatible with cyst. There appears to be a dilated multi cystic kidney in the RIGHT retroperitoneum, with partially visible calculus. No dilated RIGHT ureter is identified.  Disc levels:  T9-T10:  Negative.  T10-T11:  Negative.  T11-T12: LEFT facet arthrosis. Mild central stenosis. Shallow LEFT eccentric calcified disc bulge.  T12-L1:  Mild RIGHT facet arthrosis.  No stenosis.  L1-L2:  Bilateral facet arthrosis.  Mild central stenosis.  Retropulsion at L2 produces moderate central canal stenosis.  L2-L3: Moderate to severe multifactorial central stenosis. Retropulsion and facet arthrosis with ligamentum flavum redundancy contribute to the stenosis. Ankylosis of the L2-L3 facet joints is present resulting in abnormal mechanics at the fracture below.  L3: Moderate to severe central stenosis associated with retropulsion of L3. Pedicle fractures are visible on the axial imaging.  L3-L4: Mild bilateral foraminal stenosis associated with facet arthrosis, and disc bulging. Mild to moderate central stenosis.  L4-L5: Severe bilateral facet arthrosis. Ankylosis of the LEFT facet joint. There is severe multifactorial central stenosis with bilateral subarticular stenosis and LEFT-greater-than-RIGHT foraminal stenosis.  L5-S1:  Negative.  IMPRESSION: 1. Acute or subacute atypical L3 compression/burst fracture. Although loss of vertebral body height is mild at 25%, the fracture extends into the base of  the pedicles bilaterally. This is probably an unstable fracture given the dissociation of the vertebral body from the posterior elements and involvement of two columns. Moderate to severe central stenosis associated with retropulsion of L3. 2. Chronic L2 compression fracture with retropulsion resulting in moderate to severe central stenosis. 3. Ankylosis of the L2-L3 facet joints resulting in abnormal mechanics that likely produce the atypical fracture at L3. 4. Severe lumbar spine degenerative disease with chronic severe central stenosis at L4-L5. 5. There is lumbosacral transitional anatomy. This report assumes that there are 5 lumbar type vertebral bodies. Recommend close correlation with radiographs if intervention is elected. 6. Enlarged multi cystic RIGHT kidney. Correlation with history is recommended to determine whether this is a new finding. Consider follow-up urology consultation.   Electronically Signed   By: Dereck Ligas M.D.   On: 09/12/2014 17:29   Dg Chest Portable 1 View  09/12/2014   CLINICAL DATA:  Weakness  EXAM: PORTABLE CHEST - 1 VIEW  COMPARISON:  10/28/2013  FINDINGS: Left effusion and left lower lobe atelectasis is similar to the prior study. Cardiac enlargement. Mild vascular congestion without edema. Right lung clear.  IMPRESSION: Left effusion and left lower lobe atelectasis. Possibly due to fluid overload  and heart failure.   Electronically Signed   By: Franchot Gallo M.D.   On: 09/12/2014 16:08    Positive ROS: All other systems have been reviewed and were otherwise negative with the exception of those mentioned in the HPI and as above.  Physical Exam: General: Alert and alert in no acute distress. HEENT: Atraumatic and normocephalic. Sclera are clear. Extraocular motion is intact.  Neck: Supple, nontender, good range of motion. No JVD or carotid bruits. Lungs: Clear to auscultation bilaterally. Cardiovascular: No significant pretibial or ankle edema. Abdomen: Soft,  nontender, and nondistended. Bowel sounds are present. Skin: No lesions in the area of chief complaint Neurologic: Awake, alert, and oriented. Sensory function is grossly intact. Motor strength is felt to be 5 over 5 bilaterally. No clonus or tremor. Good motor coordination. Lymphatic: No axillary or cervical lymphadenopathy  MUSCULOSKELETAL: Moderate tenderness of the lumbar spinous processes. Normal sensation of the bilateral medial thighs. Normal sensations in the L2-S1 distributions bilaterally. 5/5 function of toe extension, ankle dorsiflexion, ankle plantar flexion, knee extension, knee flexion bilaterally.  Assessment: Ms. Peplinski is an 81 year old female with fracture of the L3 vertebral body extending into the bilateral pedicles. She has pain extending into the left leg which represents a component of radiculopathy. There is no evidence of cauda equina syndrome. I would defer management of the L3 fracture to the expertise of Dr. Ronnald Ramp from [facility], as he has previously given recommendations after review of the images.  Per his recommendations, the plan is to treat Ms. Viall in an LSO brace with activity as tolerated.  Plan: 1. LSO brace to be worn when out of bed 2. Physical therapy consult for ambulation assistance and further recommendations 3. She will follow up with Dr. Ronnald Ramp of [facility] per his prior recommendations 4. I discussed the plan with the patient as well as her family.  Leisel Pinette K. Page Spiro, MD

## 2014-09-13 NOTE — Care Management Note (Signed)
Case Management Note  Patient Details  [name] MRN: [id number] Date of Birth: [birth year]  Subjective/Objective: Medicare observation letter reviewed with patient and son Jeneen Rinks, verbalized understanding, signed, copy given, original placed on chart. Code 44 completed and placed on chart.                   Action/Plan:   Expected Discharge Date:  09/14/14               Expected Discharge Plan:     In-House Referral:     Discharge planning Services     Post Acute Care Choice:    Choice offered to:     DME Arranged:    DME Agency:     HH Arranged:    HH Agency:     Status of Service:     Medicare Important Message Given:    Date Medicare IM Given:    Medicare IM give by:    Date Additional Medicare IM Given:    Additional Medicare Important Message give by:     If discussed at [place] of Stay Meetings, dates discussed:    Additional Comments:  Ival Bible, RN 09/13/2014, 11:50 AM

## 2014-09-13 NOTE — Progress Notes (Signed)
Spoke to Dr. Bridgett Larsson about BP.  No new orders at this time.

## 2014-09-13 NOTE — Plan of Care (Signed)
Problem: Phase I Progression Outcomes Goal: Voiding-avoid urinary catheter unless indicated Outcome: Completed/Met Date Met:  09/13/14 Does not have catheter. Is incontinent

## 2014-09-14 MED ORDER — AMLODIPINE BESYLATE 10 MG PO TABS
10.0000 mg | ORAL_TABLET | Freq: Every day | ORAL | Status: DC
  Administered 2014-09-14 – 2014-09-18 (×5): 10 mg via ORAL
  Filled 2014-09-14 (×5): qty 1

## 2014-09-14 MED ORDER — HYDRALAZINE HCL 20 MG/ML IJ SOLN
10.0000 mg | Freq: Once | INTRAMUSCULAR | Status: AC
  Administered 2014-09-14: 10 mg via INTRAVENOUS
  Filled 2014-09-14: qty 1

## 2014-09-14 MED ORDER — HYDRALAZINE HCL 20 MG/ML IJ SOLN
10.0000 mg | INTRAMUSCULAR | Status: DC | PRN
  Administered 2014-09-14: 10 mg via INTRAVENOUS
  Filled 2014-09-14: qty 1

## 2014-09-14 NOTE — Progress Notes (Signed)
Called Dr. Bridgett Larsson about BP,  Order to give 10mg  of Hydrazaline once.

## 2014-09-14 NOTE — Clinical Social Work Note (Signed)
Clinical Social Work Assessment  Patient Details  [name] MRN: [id number] Date of Birth: 1924/11/24  Date of referral:  09/14/14               Reason for consult:  Facility Placement                Permission sought to share information with:  [name] granted to share information::  Yes, Verbal Permission Granted  Name::        Agency::   (SNF for bed search)  Relationship::     Contact Information:     Housing/Transportation Living arrangements for the past 2 months:  Single Family Home Source of Information:  Adult Children Patient Interpreter Needed:  None Criminal Activity/Legal Involvement Pertinent to Current Situation/Hospitalization:  No - Comment as needed Significant Relationships:  Adult Children Lives with:  Adult Children Do you feel safe going back to the place where you live?    Need for family participation in patient care:  Yes (Comment) (son to assist with caring for patient  and discharge needs)  Care giving concerns:  Son is concerned with patient returning home.   Social Worker assessment / plan:   Patient is an 81 year old female who reported to the hospital after a fall about two weeks ago.  After fall patient experienced a decline in her health and ambulation was unable to get out of bed.  Patient was sleep when CSW entered the room, her son Andrea Schaefer who live in [place] was at bed side providing all information.    [name].  Per son Andrea Schaefer, his brother is very high functioning, he drives and cares for his mother.  Son states patient used a walker and went out to eat daily with her son prior to her fall.   Patient evaluated by PT with recommendation for SNF.  Per Andrea Schaefer patient has never been to a SNF but he believes patient will need rehab as family is unable to care for her in her current state. Would like patient to do a bed search.  CSW will complete FL2 and fax out in  anticipation of [name].  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) Forensic scientist:  Information systems manager, Futures trader PT Recommendations:  [place] / Referral to community resources:  Acute Rehab, [place]  Patient/Family's Response to care:  Son is appreciative of bed search and in agreement with [name]    Patient/Family's Understanding of and Emotional Response to Diagnosis, Current Treatment, and Prognosis:  Son in agreement with [name] for rehab     Emotional Assessment Appearance:  Appears stated age Attitude/Demeanor/Rapport:  Unable to Assess Affect (typically observed):  Unable to Assess Orientation:    Alcohol / Substance use:    Psych involvement (Current and /or in the community):     Discharge Needs  Concerns to be addressed:  Discharge Planning Concerns Readmission within the last 30 days:  No Current discharge risk:  Dependent with Mobility Barriers to Discharge:  No Barriers Identified (none at this time: possible concerns with insurance due to not having a 3 night inpatient stay)   Andrea Cane, LCSW 09/14/2014, 3:05 PM Andrea Schaefer. Andrea Schaefer, MSW Clinical Social Work Department Emergency Room [phone] 3:07 PM

## 2014-09-14 NOTE — Progress Notes (Signed)
[facility] at [place] [name]    MR#:  [id number]  DATE OF BIRTH:  22-Jan-1925  SUBJECTIVE:  CHIEF COMPLAINT:   Chief Complaint  Patient presents with  . Back Pain   The patient is confused. Still back pain, exacerbated by movement. Blood pressure was high at 210/78 this morning. REVIEW OF SYSTEMS:  CONSTITUTIONAL: No fever, has weakness.  EYES: No blurred or double vision.  EARS, NOSE, AND THROAT: No tinnitus or ear pain.  RESPIRATORY: No cough, shortness of breath, wheezing or hemoptysis.  CARDIOVASCULAR: No chest pain, orthopnea, edema.  GASTROINTESTINAL: No nausea, vomiting, diarrhea or abdominal pain.  GENITOURINARY: No dysuria, hematuria.  ENDOCRINE: No polyuria, nocturia,  HEMATOLOGY: No anemia, easy bruising or bleeding SKIN: No rash or lesion. MUSCULOSKELETAL: back pain.  NEUROLOGIC: No tingling, numbness, weakness.  PSYCHIATRY: No anxiety or depression.   DRUG ALLERGIES:   Allergies  Allergen Reactions  . Codeine Nausea And Vomiting  . Tetracyclines & Related Nausea And Vomiting  . Adhesive [Tape] Rash  . Keflex [Cephalexin] Rash  . Naproxen Rash    VITALS:  Blood pressure 147/53, pulse 67, temperature 97.9 F (36.6 C), temperature source Oral, resp. rate 18, height 5\' 5"  (1.651 m), weight 78.019 kg (172 lb), SpO2 94 %.  PHYSICAL EXAMINATION:  GENERAL:  81 y.o.-year-old patient lying in the bed with no acute distress.  EYES: Pupils equal, round, reactive to light and accommodation. No scleral icterus. Extraocular muscles intact.  HEENT: Head atraumatic, normocephalic. Oropharynx and nasopharynx clear.  NECK:  Supple, no jugular venous distention. No thyroid enlargement, no tenderness.  LUNGS: Normal breath sounds bilaterally, no wheezing, rales,rhonchi or crepitation. No use of accessory muscles of respiration.  CARDIOVASCULAR: S1, S2 normal. No murmurs, rubs, or gallops.  ABDOMEN: Soft, nontender,  nondistended. Bowel sounds present. No organomegaly or mass.  EXTREMITIES: No pedal edema, cyanosis, or clubbing.  NEUROLOGIC: Cranial nerves II through XII are intact. Muscle strength 3/5 in all extremities. Sensation intact. Gait not checked.  PSYCHIATRIC: The patient is awake and oriented x 2. Confused. SKIN: No obvious rash, lesion, or ulcer.    LABORATORY PANEL:   CBC  Recent Labs Lab 09/12/14 1640  WBC 12.9*  HGB 13.0  HCT 41.5  PLT 354   ------------------------------------------------------------------------------------------------------------------  Chemistries   Recent Labs Lab 09/12/14 1640  NA 136  K 3.5  CL 97*  CO2 28  GLUCOSE 117*  BUN 19  CREATININE 0.87  CALCIUM 8.7*   ------------------------------------------------------------------------------------------------------------------  Cardiac Enzymes No results for input(s): TROPONINI in the last 168 hours. ------------------------------------------------------------------------------------------------------------------  RADIOLOGY:  Ct Lumbar Spine Wo Contrast  09/12/2014   CLINICAL DATA:  Lumbago/low back pain. Abnormal lumbar spine radiographs.  EXAM: CT LUMBAR SPINE WITHOUT CONTRAST  TECHNIQUE: Multidetector CT imaging of the lumbar spine was performed without intravenous contrast administration. Multiplanar CT image reconstructions were also generated.  COMPARISON:  Prior films not available for comparison.  FINDINGS: Segmentation: 5 lumbar type vertebral bodies are present. Sacralization of L5 which is fused to the sacrum.  Alignment: Straightening of the normal lumbar lordosis. Mild levoconvex curve with the apex at L2-L3.  Vertebrae: No destructive osseous lesions are present. There is a chronic L2 compression fracture with 75% loss of central vertebral body height and retropulsion.  There is an acute or subacute L3 superior endplate compression fracture with retropulsion. Loss of vertebral body height  is about 25%. The fracture margins remain visible. The L3 fracture is more  severe than typical compression fractures and fracture planes extend into the pedicles bilaterally, best seen on sagittal images. The pedicle fractures are atypical because of abnormal mechanics at L3.  L4 and L5 as well as L1 show preserved vertebral body height. The visible thoracic spine is also preserved.  Paraspinal tissues: LEFT pleural effusion is present, partially visible. Aortoiliac atherosclerosis. LEFT upper pole renal cystic lesion compatible with cyst. There appears to be a dilated multi cystic kidney in the RIGHT retroperitoneum, with partially visible calculus. No dilated RIGHT ureter is identified.  Disc levels:  T9-T10:  Negative.  T10-T11:  Negative.  T11-T12: LEFT facet arthrosis. Mild central stenosis. Shallow LEFT eccentric calcified disc bulge.  T12-L1:  Mild RIGHT facet arthrosis.  No stenosis.  L1-L2:  Bilateral facet arthrosis.  Mild central stenosis.  Retropulsion at L2 produces moderate central canal stenosis.  L2-L3: Moderate to severe multifactorial central stenosis. Retropulsion and facet arthrosis with ligamentum flavum redundancy contribute to the stenosis. Ankylosis of the L2-L3 facet joints is present resulting in abnormal mechanics at the fracture below.  L3: Moderate to severe central stenosis associated with retropulsion of L3. Pedicle fractures are visible on the axial imaging.  L3-L4: Mild bilateral foraminal stenosis associated with facet arthrosis, and disc bulging. Mild to moderate central stenosis.  L4-L5: Severe bilateral facet arthrosis. Ankylosis of the LEFT facet joint. There is severe multifactorial central stenosis with bilateral subarticular stenosis and LEFT-greater-than-RIGHT foraminal stenosis.  L5-S1:  Negative.  IMPRESSION: 1. Acute or subacute atypical L3 compression/burst fracture. Although loss of vertebral body height is mild at 25%, the fracture extends into the base of the pedicles  bilaterally. This is probably an unstable fracture given the dissociation of the vertebral body from the posterior elements and involvement of two columns. Moderate to severe central stenosis associated with retropulsion of L3. 2. Chronic L2 compression fracture with retropulsion resulting in moderate to severe central stenosis. 3. Ankylosis of the L2-L3 facet joints resulting in abnormal mechanics that likely produce the atypical fracture at L3. 4. Severe lumbar spine degenerative disease with chronic severe central stenosis at L4-L5. 5. There is lumbosacral transitional anatomy. This report assumes that there are 5 lumbar type vertebral bodies. Recommend close correlation with radiographs if intervention is elected. 6. Enlarged multi cystic RIGHT kidney. Correlation with history is recommended to determine whether this is a new finding. Consider follow-up urology consultation.   Electronically Signed   By: Dereck Ligas M.D.   On: 09/12/2014 17:29   Dg Chest Portable 1 View  09/12/2014   CLINICAL DATA:  Weakness  EXAM: PORTABLE CHEST - 1 VIEW  COMPARISON:  10/28/2013  FINDINGS: Left effusion and left lower lobe atelectasis is similar to the prior study. Cardiac enlargement. Mild vascular congestion without edema. Right lung clear.  IMPRESSION: Left effusion and left lower lobe atelectasis. Possibly due to fluid overload and heart failure.   Electronically Signed   By: Franchot Gallo M.D.   On: 09/12/2014 16:08    EKG:   Orders placed or performed in visit on 02/20/14  . EKG 12-Lead    ASSESSMENT AND PLAN:   1. Atypical compression/burst fracture L3 with radiolopathy:  On LSO bracing, pain control. PT. follow up with [facility] per his prior recommendations.  2.  Altered mental status. Possible due to acute encephalopathy secondary to hypertension malignancy.  3. Hypertension malignancy. Continue Lopressor, Norvasc and IV hydralazine when necessary.  Per PT evaluation, the  patient needed skilled nursing facility placement.  But the patient is in observation status. I discussed with the social worker and case Freight forwarder.  All the records are reviewed and case discussed with Care Management/Social Workerr. Management plans discussed with the patient, family and they are in agreement.  CODE STATUS: Full code  TOTAL TIME TAKING CARE OF THIS PATIENT: 41  minutes.   POSSIBLE D/C IN 2 DAYS, DEPENDING ON CLINICAL CONDITION.   Demetrios Loll M.D on 09/14/2014 at 2:43 PM  Between 7am to 6pm - Pager - [phone]  After 6pm go to [email or web address] - password [email or web address] [facility]  Office  [phone]  CC: Primary care physician; [name], MD

## 2014-09-14 NOTE — Progress Notes (Addendum)
   09/14/14 1500  Clinical Encounter Type  Visited With Family  Visit Type Spiritual support  Spiritual Encounters  Spiritual Needs Prayer  Stress Factors  Patient Stress Factors Health changes   Status: 30 female not very lucid  Compression fracture of L3 lumbar vertebra Family: Son present Faith: [employer] Visit: Chaplain visited with patient's son. He says that his mother is not very lucid today. He seemed to be worried. He shared that she was married to a [employer]. Chaplain gave encouraging words and will lift the patient up in prayer for healing and comfort.  Pastoral care can be reached via pager [phone] or online

## 2014-09-14 NOTE — Progress Notes (Signed)
Called Dr. Bridgett Larsson because I believe patient is getting worse.  I told him of her increased confusion and how we needed to feed her today.   Informed him of her not being able to walk or hardly stand and I was concerned.  I told him of her being OBS and how I believed she needed to be converted to inpatient somehow so she can have a higher level of care post discharge.  Dr. Bridgett Larsson said "I don't know.   Call case manager, I already spoke to her."

## 2014-09-14 NOTE — Progress Notes (Signed)
Pt doing well this shift, able to take meds with applesauce, blood pressure continues to increase managed with medications.

## 2014-09-15 MED ORDER — METOPROLOL TARTRATE 50 MG PO TABS
50.0000 mg | ORAL_TABLET | Freq: Two times a day (BID) | ORAL | Status: DC
  Administered 2014-09-15 – 2014-09-18 (×7): 50 mg via ORAL
  Filled 2014-09-15 (×7): qty 1

## 2014-09-15 NOTE — Progress Notes (Signed)
Patient changed to inpatient status 09/15/14. Patient will need a 3 night stay. Clinical Education officer, museum (CSW) met with patient and son [name] and made them aware of above. CSW also presented bed offers. Patient and [name] will review offers and get back with CSW. CSW will continue to follow and assist as needed.   Blima Rich, [place] [phone]

## 2014-09-15 NOTE — Clinical Social Work Placement (Signed)
   CLINICAL SOCIAL WORK PLACEMENT  NOTE  Date:  09/15/2014  Patient Details  [name] MRN: [id number] Date of Birth: 1924/08/09  Clinical Social Work is seeking post-discharge placement for this patient at the [place] level of care (*CSW will initial, date and re-position this form in  chart as items are completed):  Yes   Patient/family provided with [facility]'s list of facilities offering this level of care within the geographic area requested by the patient (or if unable, by the patient's family).  Yes   Patient/family informed of their freedom to choose among providers that offer the needed level of care, that participate in Medicare, Medicaid or managed care program needed by the patient, have an available bed and are willing to accept the patient.  Yes   Patient/family informed of [facility]'s ownership interest in [facility] and [facility], as well as of the fact that they are under no obligation to receive care at these facilities.  PASRR submitted to EDS on 09/15/14     PASRR number received on 09/15/14     Existing PASRR number confirmed on       FL2 transmitted to all facilities in geographic area requested by pt/family on 09/15/14     FL2 transmitted to all facilities within larger geographic area on       Patient informed that his/her managed care company has contracts with or will negotiate with certain facilities, including the following:        Yes   Patient/family informed of bed offers received.  Patient chooses bed at       Physician recommends and patient chooses bed at      Patient to be transferred to   on  .  Patient to be transferred to facility by       Patient family notified on   of transfer.  Name of family member notified:        PHYSICIAN Please sign FL2     Additional Comment:    _______________________________________________ Loralyn Freshwater, LCSW 09/15/2014, 3:27  PM

## 2014-09-15 NOTE — Progress Notes (Signed)
Clinical Education officer, museum (CSW) met with patient and her son [name] [facility]) was at bedside. CSW explained to patient and son that patient is Medicare Observation status and does not have a payer for SNF. Patient and son verbalized their understanding. Patient and son are open to home health. CSW explained to son Medicaid process and long term care options. Per [name] patient has a adult son who is autistic that also lives with patient in [place]. Per [name] his brother can care for himself and also drives.    CSW spoke with patients' niece Blanch Media outside of patient's room alone. Per Blanch Media patient needs placement however patient and son will not start Medicaid application. Per niece patient's house is dirty and she needs more assistance at home. CSW gave Blanch Media number to [facility] [employer] and encouraged her to call make a report if patient continues to need placement and the son is not assisting in that. Blanch Media thanked CSW for assistance. CSW will continue to follow and assist as needed.   Blima Rich, [place] [phone]

## 2014-09-15 NOTE — Progress Notes (Signed)
[place] at [place] [name]    MR#:  [id number]  DATE OF BIRTH:  03-13-1924  SUBJECTIVE:  CHIEF COMPLAINT:   Chief Complaint  Patient presents with  . Back Pain    Still back pain, unable to move.  REVIEW OF SYSTEMS:  CONSTITUTIONAL: No fever, has weakness.  EYES: No blurred or double vision.  EARS, NOSE, AND THROAT: No tinnitus or ear pain.  RESPIRATORY: No cough, shortness of breath, wheezing or hemoptysis.  CARDIOVASCULAR: No chest pain, orthopnea, edema.  GASTROINTESTINAL: No nausea, vomiting, diarrhea or abdominal pain.  GENITOURINARY: No dysuria, hematuria.  ENDOCRINE: No polyuria, nocturia,  HEMATOLOGY: No anemia, easy bruising or bleeding SKIN: No rash or lesion. MUSCULOSKELETAL: back pain.  NEUROLOGIC: No tingling, numbness, weakness.  PSYCHIATRY: No anxiety or depression.   DRUG ALLERGIES:   Allergies  Allergen Reactions  . Codeine Nausea And Vomiting  . Tetracyclines & Related Nausea And Vomiting  . Adhesive [Tape] Rash  . Keflex [Cephalexin] Rash  . Naproxen Rash    VITALS:  Blood pressure 155/60, pulse 67, temperature 97.7 F (36.5 C), temperature source Oral, resp. rate 18, height 5\' 5"  (1.651 m), weight 78.019 kg (172 lb), SpO2 94 %.  PHYSICAL EXAMINATION:  GENERAL:  81 y.o.-year-old patient lying in the bed with no acute distress.  EYES: Pupils equal, round, reactive to light and accommodation. No scleral icterus. Extraocular muscles intact.  HEENT: Head atraumatic, normocephalic. Oropharynx and nasopharynx clear.  NECK:  Supple, no jugular venous distention. No thyroid enlargement, no tenderness.  LUNGS: Normal breath sounds bilaterally, no wheezing, rales,rhonchi or crepitation. No use of accessory muscles of respiration.  CARDIOVASCULAR: S1, S2 normal. No murmurs, rubs, or gallops.  ABDOMEN: Soft, nontender, nondistended. Bowel sounds present. No organomegaly or mass.  EXTREMITIES: No  pedal edema, cyanosis, or clubbing.  NEUROLOGIC: Cranial nerves II through XII are intact. Muscle strength 4/5 in upper extremities, 3/5 in lower extremities. Sensation intact. Gait not checked.  PSYCHIATRIC: The patient is awake and oriented x 2. Confused. SKIN: No obvious rash, lesion, or ulcer.    LABORATORY PANEL:   CBC  Recent Labs Lab 09/12/14 1640  WBC 12.9*  HGB 13.0  HCT 41.5  PLT 354   ------------------------------------------------------------------------------------------------------------------  Chemistries   Recent Labs Lab 09/12/14 1640  NA 136  K 3.5  CL 97*  CO2 28  GLUCOSE 117*  BUN 19  CREATININE 0.87  CALCIUM 8.7*   ------------------------------------------------------------------------------------------------------------------  Cardiac Enzymes No results for input(s): TROPONINI in the last 168 hours. ------------------------------------------------------------------------------------------------------------------  RADIOLOGY:  No results found.  EKG:   Orders placed or performed in visit on 02/20/14  . EKG 12-Lead    ASSESSMENT AND PLAN:   1. Atypical compression/burst fracture L3 with radiolopathy:   still unable to move or walk. Continue pain control. PT. follow up with [facility] per his prior recommendations.  2.  Altered mental status. Possible due to acute encephalopathy secondary to hypertension malignancy. Better. 3. Hypertension malignancy. Better controlled. increased Lopressor, continue Norvasc and IV hydralazine when necessary.  Per PT evaluation, the patient needs skilled nursing facility placement.  The patient is in observation status, but unable to discharge to home.  I discussed with the social worker and case Freight forwarder.  All the records are reviewed and case discussed with Care Management/Social Workerr. Management plans discussed with the patient, her son and they are in agreement.  CODE STATUS:  Full code  TOTAL  TIME TAKING CARE OF THIS PATIENT: 39  minutes.   POSSIBLE D/C IN 2 DAYS, DEPENDING ON CLINICAL CONDITION.   Demetrios Loll M.D on 09/15/2014 at 12:35 PM  Between 7am to 6pm - Pager - [phone]  After 6pm go to [email or web address] - password [email or web address] [facility]  Office  [phone]  CC: Primary care physician; [name], MD

## 2014-09-15 NOTE — Care Management (Signed)
Elevated BP. ?Pain related? MEDICARE OBSERVATION since 09/12/14- NO payer for SNF. Multiple fractures in back along with cystic right kidney/enlarged.

## 2014-09-16 LAB — PLATELET COUNT: PLATELETS: 360 10*3/uL (ref 150–440)

## 2014-09-16 MED ORDER — HYDRALAZINE HCL 25 MG PO TABS
25.0000 mg | ORAL_TABLET | Freq: Three times a day (TID) | ORAL | Status: DC
  Administered 2014-09-16 – 2014-09-18 (×6): 25 mg via ORAL
  Filled 2014-09-16 (×6): qty 1

## 2014-09-16 NOTE — Progress Notes (Signed)
Clinical Social Worker (CSW) met with patient's son Jamie this afternoon to get SNF choice. Son reported that he visited [facility] and [facility] and would like to discuss them with other family members. CSW encouraged son to have a choice by tomorrow morning. CSW will continue to follow and assist as needed.    [place], LCSWA [phone] 

## 2014-09-16 NOTE — Progress Notes (Signed)
PT Cancellation Note  Patient Details [name] MRN: [id number] DOB: 09-Jun-1924   Cancelled Treatment:     PT entered the room at 4:23 PM, patient was fast asleep. PT deferred waking patient, and will re-attempt to progress mobilization as tolerated tomorrow.   Kerman Passey, PT, DPT     09/16/2014, 4:29 PM

## 2014-09-16 NOTE — Progress Notes (Signed)
Clinical Social Worker (CSW) met with patient and son [name] to get bed choice. Per son he is going to [facility] and [facility] today. CSW will follow up this afternoon for SNF choice. CSW will continue to follow and assist as needed.   Blima Rich, [place] [phone]

## 2014-09-16 NOTE — Progress Notes (Signed)
   09/15/14 1310  Clinical Encounter Type  Visited With Patient and family together  Visit Type Initial;Spiritual support  Consult/Referral To Chaplain  Spiritual Encounters  Spiritual Needs Emotional  Stress Factors  Patient Stress Factors Health changes  Family Stress Factors Health changes  Chaplain rounded in unit and offered spiritual support and compassionate presence to patient and son. Patient expressed she felt some better and that her husband was a Environmental education officer. Will follow up as applicable. Chaplain Courtni Balash A. Brylin Stanislawski Ext. [phone]

## 2014-09-16 NOTE — Progress Notes (Addendum)
[place] at [place] [name]    MR#:  [id number]  DATE OF BIRTH:  Nov 29, 1924  SUBJECTIVE:  CHIEF COMPLAINT:   Chief Complaint  Patient presents with  . Back Pain   better back pain, limited movement on bed.  REVIEW OF SYSTEMS:  CONSTITUTIONAL: No fever, has weakness.  EYES: No blurred or double vision.  EARS, NOSE, AND THROAT: No tinnitus or ear pain.  RESPIRATORY: No cough, shortness of breath, wheezing or hemoptysis.  CARDIOVASCULAR: No chest pain, orthopnea, edema.  GASTROINTESTINAL: No nausea, vomiting, diarrhea or abdominal pain.  GENITOURINARY: No dysuria, hematuria.  ENDOCRINE: No polyuria, nocturia,  HEMATOLOGY: No anemia, easy bruising or bleeding SKIN: No rash or lesion. MUSCULOSKELETAL: back pain.  NEUROLOGIC: No tingling, numbness, weakness.  PSYCHIATRY: No anxiety or depression.   DRUG ALLERGIES:   Allergies  Allergen Reactions  . Codeine Nausea And Vomiting  . Tetracyclines & Related Nausea And Vomiting  . Adhesive [Tape] Rash  . Keflex [Cephalexin] Rash  . Naproxen Rash    VITALS:  Blood pressure 168/62, pulse 61, temperature 97.8 F (36.6 C), temperature source Oral, resp. rate 16, height 5\' 5"  (1.651 m), weight 78.019 kg (172 lb), SpO2 93 %.  PHYSICAL EXAMINATION:  GENERAL:  81 y.o.-year-old patient lying in the bed with no acute distress.  EYES: Pupils equal, round, reactive to light and accommodation. No scleral icterus. Extraocular muscles intact.  HEENT: Head atraumatic, normocephalic. Oropharynx and nasopharynx clear.  NECK:  Supple, no jugular venous distention. No thyroid enlargement, no tenderness.  LUNGS: Normal breath sounds bilaterally, no wheezing, rales,rhonchi or crepitation. No use of accessory muscles of respiration.  CARDIOVASCULAR: S1, S2 normal. No murmurs, rubs, or gallops.  ABDOMEN: Soft, nontender, nondistended. Bowel sounds present. No organomegaly or mass.   EXTREMITIES: No pedal edema, cyanosis, or clubbing.  NEUROLOGIC: Cranial nerves II through XII are intact. Muscle strength 3/5 in upper extremities, 3/5 in lower extremities. Sensation intact. Gait not checked.  PSYCHIATRIC: The patient is awake and oriented x 2. Confused. SKIN: No obvious rash, lesion, or ulcer.    LABORATORY PANEL:   CBC  Recent Labs Lab 09/12/14 1640 09/16/14 0524  WBC 12.9*  --   HGB 13.0  --   HCT 41.5  --   PLT 354 360   ------------------------------------------------------------------------------------------------------------------  Chemistries   Recent Labs Lab 09/12/14 1640  NA 136  K 3.5  CL 97*  CO2 28  GLUCOSE 117*  BUN 19  CREATININE 0.87  CALCIUM 8.7*   ------------------------------------------------------------------------------------------------------------------  Cardiac Enzymes No results for input(s): TROPONINI in the last 168 hours. ------------------------------------------------------------------------------------------------------------------  RADIOLOGY:  No results found.  EKG:   Orders placed or performed in visit on 02/20/14  . EKG 12-Lead    ASSESSMENT AND PLAN:   1. Atypical compression/burst fracture L3 with radiolopathy:   Continue pain control. PT. follow up with [facility] per his prior recommendations.  2.  Altered mental status. Possible due to acute encephalopathy secondary to hypertension malignancy. Better. 3. Hypertension malignancy. Better controlled. increased Lopressor, continue Norvasc an, add hydralazine po,  IV hydralazine when necessary.  Per PT evaluation, the patient needs skilled nursing facility placement.  All the records are reviewed and case discussed with Care Management/Social Workerr. Management plans discussed with the patient, her son and they are in agreement.  CODE STATUS: Full code  TOTAL TIME TAKING CARE OF THIS PATIENT: 33  minutes.   POSSIBLE D/C TO  SKILLED NURSING FACILITY IN 2 DAYS, DEPENDING ON CLINICAL CONDITION.   Demetrios Loll M.D on 09/16/2014 at 3:09 PM  Between 7am to 6pm - Pager - [phone]  After 6pm go to [email or web address] - password [email or web address] [facility]  Office  [phone]  CC: Primary care physician; [name], MD

## 2014-09-16 NOTE — Care Management Important Message (Signed)
Important Message  Patient Details  Name: BRANDEE MARKIN MRN: [id number] Date of Birth: [birth year]   Medicare Important Message Given:  Yes-second notification given    Juliann Pulse A [place] 09/16/2014, 9:35 AM

## 2014-09-17 ENCOUNTER — Encounter
Admission: RE | Admit: 2014-09-17 | Discharge: 2014-09-17 | Disposition: A | Discharge status: Home / Self Care | Payer: Medicare Other | Source: Ambulatory Visit | Attending: Internal Medicine | Admitting: Internal Medicine

## 2014-09-17 NOTE — Progress Notes (Signed)
Physical Therapy Treatment Patient Details [name] MRN: [id number] DOB: Apr 24, 1924 Today's Date: 09/17/2014    History of Present Illness Pt had a fall 2 weeks ago, has had gradually increased back pain that is now intractable    PT Comments    Patient displays improved pain tolerance and mobility today, though still limited compared to her baseline. Of note R sided weakness noted in bed level exercises, and again during ambulation demonstrated by R LE knee buckling. This significantly hinders her OOB mobility and independence. Patient demonstrates decreased balance in sitting, likely a result of trunkal/abdominal pain/weakness. Patient takes several attempts to become comfortable in sitting, and states she will sit up for a while as she knows it is good for her. RN staff notified of the above. Skilled acute PT services continue to be indicated to address her decreased mobility.   Follow Up Recommendations  SNF     Equipment Recommendations       Recommendations for Other Services       Precautions / Restrictions Precautions Precautions: Fall Required Braces or Orthoses: Spinal Brace Spinal Brace: Thoracolumbosacral orthotic (Now without thoracic component. ) Restrictions Weight Bearing Restrictions: No    Mobility  Bed Mobility Overal bed mobility: Needs Assistance Bed Mobility: Supine to Sit     Supine to sit: Mod assist     General bed mobility comments: Patient educated on log rolling and assisted in pushing off from the bed to come to upright.   Transfers Overall transfer level: Needs assistance Equipment used: Rolling walker (2 wheeled) Transfers: Sit to/from Stand Sit to Stand: Min assist         General transfer comment: PT applied TLSO brace in sitting, which alleviated some of her discomfort. Patient required min A x1 for standing with minimal verbal cuing for correct hand placement on RW.   Ambulation/Gait Ambulation/Gait assistance: Min  assist Ambulation Distance (Feet): 3 Feet Assistive device: Rolling walker (2 wheeled)     Gait velocity interpretation: Below normal speed for age/gender General Gait Details: Patient is able to take steps with min A x1 with RW however she displays mild buckling of RLE secondary to weakness/pain in her RLE. LLE WFL with stepping, however markedly different than RLE.    Stairs            Wheelchair Mobility    Modified Rankin (Stroke Patients Only)       Balance Overall balance assessment: Needs assistance Sitting-balance support: Bilateral upper extremity supported Sitting balance-Leahy Scale: Fair Sitting balance - Comments: Patient uses bilateral UEs to relieve pressure on lumbar spine in sitting, she prefers her hands behind her. She is unable to sit without loss of balance without use of her UEs.  Postural control: Posterior lean Standing balance support: Bilateral upper extremity supported Standing balance-Leahy Scale: Poor Standing balance comment: Patient requires RW and PT assistance secondary to RLE weakness/buckling.                     Cognition Arousal/Alertness: Awake/alert Behavior During Therapy: WFL for tasks assessed/performed Overall Cognitive Status: Within Functional Limits for tasks assessed                      Exercises General Exercises - Lower Extremity Ankle Circles/Pumps: AROM;Both;10 reps Hip ABduction/ADduction: AROM;Both;10 reps Straight Leg Raises: AROM;Both;10 reps    General Comments        Pertinent Vitals/Pain Pain Assessment:  (Patient indicates she is uncomfortable throughout session  but states she wants to do what is best for her, which she knows is mobilization. ) Pain Location: Lower back    Home Living                      Prior Function            PT Goals (current goals can now be found in the care plan section) Acute Rehab PT Goals Patient Stated Goal: "Need to get rid of this terrible  pain" PT Goal Formulation: With patient/family Time For Goal Achievement: 09/27/14 Potential to Achieve Goals: Fair Progress towards PT goals: Progressing toward goals    Frequency  Min 2X/week    PT Plan Current plan remains appropriate    Co-evaluation             End of Session Equipment Utilized During Treatment: Gait belt Activity Tolerance: Patient limited by pain;Patient limited by fatigue Patient left: in chair;with call bell/phone within reach;with chair alarm set;with family/visitor present     Time: 1544-1610 PT Time Calculation (min) (ACUTE ONLY): 26 min  Charges:  $Gait Training: 8-22 mins $Therapeutic Exercise: 8-22 mins                    G Codes:      Kerman Passey, PT, DPT    09/17/2014, 4:26 PM

## 2014-09-17 NOTE — Progress Notes (Signed)
[facility] [place] [name]    MR#:  [id number]  DATE OF BIRTH:  [birth year]  SUBJECTIVE:  CHIEF COMPLAINT:   Chief Complaint  Patient presents with  . Back Pain   better back pain is better, able to move legs on bed.  REVIEW OF SYSTEMS:  CONSTITUTIONAL: No fever, has weakness.  EYES: No blurred or double vision.  EARS, NOSE, AND THROAT: No tinnitus or ear pain.  RESPIRATORY: No cough, shortness of breath, wheezing or hemoptysis.  CARDIOVASCULAR: No chest pain, orthopnea, edema.  GASTROINTESTINAL: No nausea, vomiting, diarrhea or abdominal pain.  GENITOURINARY: No dysuria, hematuria.  ENDOCRINE: No polyuria, nocturia,  HEMATOLOGY: No anemia, easy bruising or bleeding SKIN: No rash or lesion. MUSCULOSKELETAL: back pain.  NEUROLOGIC: No tingling, numbness, weakness.  PSYCHIATRY: No anxiety or depression.   DRUG ALLERGIES:   Allergies  Allergen Reactions  . Codeine Nausea And Vomiting  . Tetracyclines & Related Nausea And Vomiting  . Adhesive [Tape] Rash  . Keflex [Cephalexin] Rash  . Naproxen Rash    VITALS:  Blood pressure 138/43, pulse 59, temperature 97.5 F (36.4 C), temperature source Oral, resp. rate 16, height 5\' 5"  (1.651 m), weight 78.019 kg (172 lb), SpO2 95 %.  PHYSICAL EXAMINATION:  GENERAL:  81 y.o.-year-old patient lying in the bed with no acute distress.  EYES: Pupils equal, round, reactive to light and accommodation. No scleral icterus. Extraocular muscles intact.  HEENT: Head atraumatic, normocephalic. Oropharynx and nasopharynx clear.  NECK:  Supple, no jugular venous distention. No thyroid enlargement, no tenderness.  LUNGS: Normal breath sounds bilaterally, no wheezing, rales,rhonchi or crepitation. No use of accessory muscles of respiration.  CARDIOVASCULAR: S1, S2 normal. No murmurs, rubs, or gallops.  ABDOMEN: Soft, nontender, nondistended. Bowel sounds present. No organomegaly or mass.   EXTREMITIES: No pedal edema, cyanosis, or clubbing.  NEUROLOGIC: Cranial nerves II through XII are intact. Muscle strength 5/5 in upper extremities, 4/5 in lower extremities. Sensation intact. Gait not checked.  PSYCHIATRIC: The patient is awake and oriented x 3.  SKIN: No obvious rash, lesion, or ulcer.    LABORATORY PANEL:   CBC  Recent Labs Lab 09/12/14 1640 09/16/14 0524  WBC 12.9*  --   HGB 13.0  --   HCT 41.5  --   PLT 354 360   ------------------------------------------------------------------------------------------------------------------  Chemistries   Recent Labs Lab 09/12/14 1640  NA 136  K 3.5  CL 97*  CO2 28  GLUCOSE 117*  BUN 19  CREATININE 0.87  CALCIUM 8.7*   ------------------------------------------------------------------------------------------------------------------  Cardiac Enzymes No results for input(s): TROPONINI in the last 168 hours. ------------------------------------------------------------------------------------------------------------------  RADIOLOGY:  No results found.  EKG:   Orders placed or performed in visit on 02/20/14  . EKG 12-Lead    ASSESSMENT AND PLAN:   1. Atypical compression/burst fracture L3 with radiolopathy:   Continue pain control. PT. follow up with [facility]nald [facility]amp of [facility] per his prior recommendations.  2.  Altered mental status. Possible due to acute encephalopathy secondary to hypertension malignancy. Improved to her baseline.  3. Hypertension malignancy. controlled. continue Lopressor, Norvasc and hydralazine po,  IV hydralazine when necessary.  Continue PT.  All the records are reviewed and case discussed with Care Management/Social Workerr. Management plans discussed with the patient, her son and they are in agreement.  CODE STATUS: Full code  TOTAL TIME TAKING CARE OF THIS PATIENT: 35  minutes.   POSSIBLE D/C TO SKILLED NURSING FACILITY  IN Tomorrow, DEPENDING ON CLINICAL  CONDITION.   Demetrios Loll M.D on 09/17/2014 at 3:07 PM  Between 7am to 6pm - Pager - [phone]  After 6pm go to [email or web address] - password [email or web address] [facility]  Office  [phone]  CC: Primary care physician; [name], MD

## 2014-09-17 NOTE — Progress Notes (Signed)
Pt. Alert and oriented. VSS. Pain controlled with meds per MAR. Pills whole with applesauce. Using bedpan. Worked with PT this afternoon. Pt. Uses back brace while OOB. Resting quietly with son at the bedside.

## 2014-09-17 NOTE — Progress Notes (Signed)
Clinical Education officer, museum (CSW) received voicemail from patient's son [name] this morning. Son reported that he has chosen [facility]. CSW sent Kim admissions coordinator at [facility] a message making her aware of above. CSW will continue to follow and assist as needed.   Blima Rich, [place] [phone]

## 2014-09-18 MED ORDER — DOCUSATE SODIUM 100 MG PO CAPS: 100.0000 mg | ORAL_CAPSULE | Freq: Two times a day (BID) | ORAL | Status: DC

## 2014-09-18 MED ORDER — OXYCODONE HCL 5 MG PO TABS: 5.0000 mg | ORAL_TABLET | Freq: Four times a day (QID) | ORAL | Status: DC | PRN

## 2014-09-18 MED ORDER — METOPROLOL TARTRATE 50 MG PO TABS: 50.0000 mg | ORAL_TABLET | Freq: Two times a day (BID) | ORAL | Status: AC

## 2014-09-18 MED ORDER — HYDRALAZINE HCL 25 MG PO TABS
25.0000 mg | ORAL_TABLET | Freq: Three times a day (TID) | ORAL | Status: DC
Start: 2014-09-18 — End: 2019-05-10

## 2014-09-18 MED ORDER — AMLODIPINE BESYLATE 10 MG PO TABS
10.0000 mg | ORAL_TABLET | Freq: Every day | ORAL | Status: DC
Start: 2014-09-18 — End: 2019-05-10

## 2014-09-18 NOTE — Progress Notes (Signed)
Pt. D/c'd to [facility]. Report called to [facility]. EMS notified of transport.

## 2014-09-18 NOTE — Discharge Summary (Signed)
Andrea Schaefer at [place] [name]    MR#:  [id number]  DATE OF BIRTH:  1924/04/07  DATE OF ADMISSION:  09/12/2014 ADMITTING PHYSICIAN: Lytle Butte, MD  DATE OF DISCHARGE: 09/18/2014 PRIMARY CARE PHYSICIAN: [name], MD    ADMISSION DIAGNOSIS:  weakness ems   DISCHARGE DIAGNOSIS:  Atypical compression/burst fracture L3 with radiolopathy: Altered mental status. Possible due to acute encephalopathy secondary to hypertension malignancy. SECONDARY DIAGNOSIS:   Past Medical History  Diagnosis Date  . Hypertension   . Anxiety   . Gout   . Thyroid disease   . Hypothyroidism     HOSPITAL COURSE:   1. Atypical compression/burst fracture L3 with radiolopathy:  The patient has been treated with morphine and oxycodone when necessary. Dr. Page Spiro, orthopedic surgeon, suggested LSO bracing and follow up with Dr. Ronnald Ramp of [facility] per his prior recommendations. PT evaluation suggest patient the need skilled nursing facility. Initially, the patient could not move lower extremities due to back pain. After pain control and treatment and physical therapy, the patient's back pain is better and she can move a little bit.  2. Altered mental status. Possible due to acute encephalopathy secondary to hypertension malignancy. Improved to her baseline.  3. Hypertension malignancy. The patient's blood pressure was elevated to about 200. She has been treated Lopressor, added Norvasc and hydralazine po, IV hydralazine when necessary. Blood pressure is under control now.  DISCHARGE CONDITIONS:   Stable. The patient will be discharged to skilled nursing facility today.  CONSULTS OBTAINED:  Treatment Team:  Lytle Butte, MD Eugenie Filler, MD  DRUG ALLERGIES:   Allergies  Allergen Reactions  . Codeine Nausea And Vomiting  . Tetracyclines & Related Nausea And Vomiting  . Adhesive [Tape] Rash  . Keflex [Cephalexin] Rash  . Naproxen  Rash    DISCHARGE MEDICATIONS:   Current Discharge Medication List    START taking these medications   Details  amLODipine (NORVASC) 10 MG tablet Take 1 tablet (10 mg total) by mouth daily. Qty: 30 tablet, Refills: 0    docusate sodium (COLACE) 100 MG capsule Take 1 capsule (100 mg total) by mouth 2 (two) times daily. Qty: 10 capsule, Refills: 0    hydrALAZINE (APRESOLINE) 25 MG tablet Take 1 tablet (25 mg total) by mouth 3 (three) times daily. Qty: 90 tablet, Refills: 0    oxyCODONE (OXY IR/ROXICODONE) 5 MG immediate release tablet Take 1 tablet (5 mg total) by mouth every 6 (six) hours as needed for moderate pain. Qty: 20 tablet, Refills: 0      CONTINUE these medications which have CHANGED   Details  metoprolol (LOPRESSOR) 50 MG tablet Take 1 tablet (50 mg total) by mouth 2 (two) times daily. Qty: 30 tablet, Refills: 0      CONTINUE these medications which have NOT CHANGED   Details  aspirin EC 81 MG tablet Take 81 mg by mouth at bedtime.     escitalopram (LEXAPRO) 20 MG tablet Take 20 mg by mouth daily.    levothyroxine (SYNTHROID, LEVOTHROID) 100 MCG tablet Take 100 mcg by mouth daily before breakfast.    simvastatin (ZOCOR) 20 MG tablet Take 20 mg by mouth at bedtime.    tolterodine (DETROL) 1 MG tablet Take 1 mg by mouth 2 (two) times daily.      STOP taking these medications     traMADol (ULTRAM) 50 MG tablet  DISCHARGE INSTRUCTIONS:    If you experience worsening of your admission symptoms, develop shortness of breath, life threatening emergency, suicidal or homicidal thoughts you must seek medical attention immediately by calling 911 or calling your MD immediately  if symptoms less severe.  You Must read complete instructions/literature along with all the possible adverse reactions/side effects for all the Medicines you take and that have been prescribed to you. Take any new Medicines after you have completely understood and accept all the  possible adverse reactions/side effects.   Please note  You were cared for by a hospitalist during your hospital stay. If you have any questions about your discharge medications or the care you received while you were in the hospital after you are discharged, you can call the unit and asked to speak with the hospitalist on call if the hospitalist that took care of you is not available. Once you are discharged, your primary care physician will handle any further medical issues. Please note that NO REFILLS for any discharge medications will be authorized once you are discharged, as it is imperative that you return to your primary care physician (or establish a relationship with a primary care physician if you do not have one) for your aftercare needs so that they can reassess your need for medications and monitor your lab values.    Today   SUBJECTIVE   Back pain while moving lower extremities, but better than before.   VITAL SIGNS:  Blood pressure 130/48, pulse 63, temperature 97.9 F (36.6 C), temperature source Oral, resp. rate 18, height 5\' 5"  (1.651 m), weight 78.019 kg (172 lb), SpO2 93 %.  I/O:  No intake or output data in the 24 hours ending 09/18/14 1051  PHYSICAL EXAMINATION:  GENERAL:  81 y.o.-year-old patient lying in the bed with no acute distress.  EYES: Pupils equal, round, reactive to light and accommodation. No scleral icterus. Extraocular muscles intact.  HEENT: Head atraumatic, normocephalic. Oropharynx and nasopharynx clear.  NECK:  Supple, no jugular venous distention. No thyroid enlargement, no tenderness.  LUNGS: Normal breath sounds bilaterally, no wheezing, rales,rhonchi or crepitation. No use of accessory muscles of respiration.  CARDIOVASCULAR: S1, S2 normal. No murmurs, rubs, or gallops.  ABDOMEN: Soft, non-tender, non-distended. Bowel sounds present. No organomegaly or mass.  EXTREMITIES: No pedal edema, cyanosis, or clubbing.  NEUROLOGIC: Cranial nerves II  through XII are intact. Muscle strength 3-4/5 in all extremities. Sensation intact. Gait not checked.  PSYCHIATRIC: The patient is alert and oriented x 3.  SKIN: No obvious rash, lesion, or ulcer.   DATA REVIEW:   CBC  Recent Labs Lab 09/12/14 1640 09/16/14 0524  WBC 12.9*  --   HGB 13.0  --   HCT 41.5  --   PLT 354 360    Chemistries   Recent Labs Lab 09/12/14 1640  NA 136  K 3.5  CL 97*  CO2 28  GLUCOSE 117*  BUN 19  CREATININE 0.87  CALCIUM 8.7*    Cardiac Enzymes No results for input(s): TROPONINI in the last 168 hours.  Microbiology Results  Results for orders placed or performed in visit on 02/20/14  Urine culture     Status: None   Collection Time: 02/20/14  7:10 PM  Result Value Ref Range Status   Micro Text Report   Final       SOURCE: CLEAN CATCH    COMMENT  MIXED BACTERIAL ORGANISMS   COMMENT                   RESULTS SUGGESTIVE OF CONTAMINATION   ANTIBIOTIC                                                        RADIOLOGY:  No results found.      Management plans discussed with the patient, family and they are in agreement.  CODE STATUS:     Code Status Orders        Start     Ordered   09/12/14 2026  Full code   Continuous     09/12/14 2025      TOTAL TIME TAKING CARE OF THIS PATIENT: 36 minutes.    Demetrios Loll M.D on 09/18/2014 at 10:51 AM  Between 7am to 6pm - Pager - [phone]  After 6pm go to [email or web address] - password [email or web address] [place]  Office  [phone]  CC: Primary care physician; [name], MD

## 2014-09-18 NOTE — Progress Notes (Signed)
Patient is medically stable for D/C today to [facility]. Per Kim admissions coordinator at [facility] patient is going to room 214-B. RN will call report at [phone] and arrange EMS for transport. Clinical Education officer, museum (CSW) prepared D/C packet and sent D/C Summary to [employer] via carefinder. Patient's son [name] is at bedside and aware of above. Please reconsult if future social work needs arise. CSW signing off.   Blima Rich, [place] [phone]

## 2014-09-18 NOTE — Discharge Instructions (Signed)
Low sodium and low fat diet. °Activity as tolerated. °

## 2014-09-18 NOTE — Clinical Social Work Placement (Signed)
   CLINICAL SOCIAL WORK PLACEMENT  NOTE  Date:  09/18/2014  Patient Details  [name] MRN: [id number] Date of Birth: Jun 14, 1924  Clinical Social Work is seeking post-discharge placement for this patient at the [place] level of care (*CSW will initial, date and re-position this form in  chart as items are completed):  Yes   Patient/family provided with [place] Work Department's list of facilities offering this level of care within the geographic area requested by the patient (or if unable, by the patient's family).  Yes   Patient/family informed of their freedom to choose among providers that offer the needed level of care, that participate in Medicare, Medicaid or managed care program needed by the patient, have an available bed and are willing to accept the patient.  Yes   Patient/family informed of [facility]'s ownership interest in [facility] and [facility], as well as of the fact that they are under no obligation to receive care at these facilities.  PASRR submitted to EDS on 09/15/14     PASRR number received on 09/15/14     Existing PASRR number confirmed on       FL2 transmitted to all facilities in geographic area requested by pt/family on 09/15/14     FL2 transmitted to all facilities within larger geographic area on       Patient informed that his/her managed care company has contracts with or will negotiate with certain facilities, including the following:        Yes   Patient/family informed of bed offers received.  Patient chooses bed at  [facility] )     Physician recommends and patient chooses bed at      Patient to be transferred to  [facility] ) on 09/18/14.  Patient to be transferred to facility by  [facility] )     Patient family notified on 09/18/14 of transfer.  Name of family member notified:   (Son [name] is at bedside. )     PHYSICIAN       Additional Comment:     _______________________________________________ Loralyn Freshwater, LCSW 09/18/2014, 11:23 AM

## 2014-09-18 NOTE — Progress Notes (Signed)
Pt. Left via EMS

## 2014-09-18 NOTE — Care Management Important Message (Signed)
Important Message  Patient Details  Name: MORNING HALBERG MRN: [id number] Date of Birth: [birth year]   Medicare Important Message Given:  Yes-third notification given    Juliann Pulse A [place] 09/18/2014, 9:21 AM

## 2014-09-22 ENCOUNTER — Other Ambulatory Visit
Admission: RE | Admit: 2014-09-22 | Discharge: 2014-09-22 | Disposition: A | Discharge status: Home / Self Care | Payer: Medicare Other | Source: Ambulatory Visit | Attending: Internal Medicine | Admitting: Internal Medicine

## 2014-09-22 DIAGNOSIS — R300 Dysuria: Secondary | ICD-10-CM | POA: Diagnosis present

## 2014-09-22 LAB — URINALYSIS COMPLETE WITH MICROSCOPIC (ARMC ONLY)
Bilirubin Urine: NEGATIVE
Glucose, UA: NEGATIVE mg/dL
Hgb urine dipstick: NEGATIVE
Ketones, ur: NEGATIVE mg/dL
LEUKOCYTES UA: NEGATIVE
NITRITE: NEGATIVE
Protein, ur: NEGATIVE mg/dL
Specific Gravity, Urine: 1.009 (ref 1.005–1.030)
Squamous Epithelial / LPF: NONE SEEN
pH: 6 (ref 5.0–8.0)

## 2014-09-24 LAB — URINE CULTURE: Culture: NO GROWTH

## 2014-10-05 ENCOUNTER — Other Ambulatory Visit
Admission: RE | Admit: 2014-10-05 | Discharge: 2014-10-05 | Disposition: A | Discharge status: Home / Self Care | Payer: Medicare Other | Source: Ambulatory Visit | Attending: Internal Medicine | Admitting: Internal Medicine

## 2014-10-05 DIAGNOSIS — N390 Urinary tract infection, site not specified: Secondary | ICD-10-CM | POA: Insufficient documentation

## 2014-10-05 LAB — URINALYSIS COMPLETE WITH MICROSCOPIC (ARMC ONLY)
Bilirubin Urine: NEGATIVE
GLUCOSE, UA: NEGATIVE mg/dL
Hgb urine dipstick: NEGATIVE
Ketones, ur: NEGATIVE mg/dL
NITRITE: NEGATIVE
Protein, ur: NEGATIVE mg/dL
SPECIFIC GRAVITY, URINE: 1.011 (ref 1.005–1.030)
pH: 7 (ref 5.0–8.0)

## 2014-10-07 LAB — URINE CULTURE: Culture: >=100000

## 2014-10-16 ENCOUNTER — Encounter
Admission: RE | Admit: 2014-10-16 | Discharge: 2014-10-16 | Disposition: A | Discharge status: Home / Self Care | Payer: Medicare Other | Source: Ambulatory Visit | Attending: Internal Medicine | Admitting: Internal Medicine

## 2015-05-15 ENCOUNTER — Emergency Department
Admission: EM | Admit: 2015-05-15 | Discharge: 2015-05-15 | Disposition: A | Discharge status: Home / Self Care | Payer: Medicare Other | Attending: Emergency Medicine | Admitting: Emergency Medicine

## 2015-05-15 ENCOUNTER — Encounter: Payer: Self-pay | Admitting: Emergency Medicine

## 2015-05-15 DIAGNOSIS — Z79899 Other long term (current) drug therapy: Secondary | ICD-10-CM | POA: Diagnosis not present

## 2015-05-15 DIAGNOSIS — I10 Essential (primary) hypertension: Secondary | ICD-10-CM | POA: Insufficient documentation

## 2015-05-15 DIAGNOSIS — Z7982 Long term (current) use of aspirin: Secondary | ICD-10-CM | POA: Insufficient documentation

## 2015-05-15 DIAGNOSIS — R040 Epistaxis: Secondary | ICD-10-CM | POA: Diagnosis present

## 2015-05-15 DIAGNOSIS — M7981 Nontraumatic hematoma of soft tissue: Secondary | ICD-10-CM | POA: Insufficient documentation

## 2015-05-15 LAB — CBC WITH DIFFERENTIAL/PLATELET
BASOS ABS: 0.1 10*3/uL (ref 0–0.1)
BASOS PCT: 1 %
EOS ABS: 0.2 10*3/uL (ref 0–0.7)
Eosinophils Relative: 2 %
HCT: 37 % (ref 35.0–47.0)
HEMOGLOBIN: 12.1 g/dL (ref 12.0–16.0)
LYMPHS ABS: 0.9 10*3/uL — AB (ref 1.0–3.6)
Lymphocytes Relative: 9 %
MCH: 28.5 pg (ref 26.0–34.0)
MCHC: 32.8 g/dL (ref 32.0–36.0)
MCV: 87 fL (ref 80.0–100.0)
Monocytes Absolute: 0.7 10*3/uL (ref 0.2–0.9)
Monocytes Relative: 7 %
NEUTROS PCT: 81 %
Neutro Abs: 8 10*3/uL — ABNORMAL HIGH (ref 1.4–6.5)
PLATELETS: 263 10*3/uL (ref 150–440)
RBC: 4.25 MIL/uL (ref 3.80–5.20)
RDW: 15.2 % — ABNORMAL HIGH (ref 11.5–14.5)
WBC: 9.9 10*3/uL (ref 3.6–11.0)

## 2015-05-15 NOTE — ED Provider Notes (Signed)
[facility] Emergency Department Provider Note   ____________________________________________  Time seen: Approximately 11:30 AM I have reviewed the triage vital signs and the triage nursing note.  HISTORY  Chief Complaint Epistaxis   Historian Patient  HPI Andrea Schaefer is a 82 y.o. female is here for evaluation of epistaxis. Up until 2 days ago she never had a bloody nose. She had several episodes over the last 2 days. She does a baby aspirin but reports no other blood thinners. She is treated for high blood pressure. She is quite concerned about the recurrence of the nose.  EMS reports giving Afrin and a nose clamp was placed upon patient arrival.  She reports spitting just tiny amounts of blood clots out her mouth.    Past Medical History  Diagnosis Date  . Hypertension   . Anxiety   . Gout   . Thyroid disease   . Hypothyroidism     Patient Active Problem List   Diagnosis Date Noted  . Compression fracture of L3 lumbar vertebra ([place]) 09/12/2014  . Intractable back pain 09/12/2014    Past Surgical History  Procedure Laterality Date  . Back surgery      Current Outpatient Rx  Name  Route  Sig  Dispense  Refill  . amLODipine (NORVASC) 10 MG tablet   Oral   Take 1 tablet (10 mg total) by mouth daily.   30 tablet   0   . aspirin EC 81 MG tablet   Oral   Take 81 mg by mouth at bedtime.          . cholecalciferol (VITAMIN D) 1000 units tablet   Oral   Take 2,000 Units by mouth daily.         Marland Kitchen escitalopram (LEXAPRO) 20 MG tablet   Oral   Take 20 mg by mouth daily.         . hydrALAZINE (APRESOLINE) 25 MG tablet   Oral   Take 1 tablet (25 mg total) by mouth 3 (three) times daily.   90 tablet   0   . levothyroxine (SYNTHROID, LEVOTHROID) 100 MCG tablet   Oral   Take 100 mcg by mouth daily before breakfast.         . metoprolol (LOPRESSOR) 50 MG tablet   Oral   Take 1 tablet (50 mg total) by mouth 2 (two) times  daily. Patient taking differently: Take 25 mg by mouth 2 (two) times daily.    30 tablet   0   . simvastatin (ZOCOR) 20 MG tablet   Oral   Take 20 mg by mouth at bedtime.           Allergies Codeine; Tetracyclines & related; Adhesive; Keflex; and Naproxen  Family History  Problem Relation Age of Onset  . Hypertension Other     Social History Social History  Substance Use Topics  . Smoking status: Never Smoker   . Smokeless tobacco: None  . Alcohol Use: No    Review of Systems  Constitutional: Negative for fever or recent illnesses. Eyes: Negative for visual changes. ENT: Negative for sore throat. Positive for nose bleed as per history of present illness Cardiovascular: Negative for chest pain. Respiratory: Negative for shortness of breath. Gastrointestinal: Negative for abdominal pain, vomiting and diarrhea. Genitourinary: Negative for dysuria. Denies rectal bleeding or dark tarry stools. Musculoskeletal: Negative for back pain. Skin: Negative for rash. Denies other sites of bleeding, denies easy bruising. Neurological: Negative for headache. 10 point  Review of Systems otherwise negative ____________________________________________   PHYSICAL EXAM:  VITAL SIGNS: ED Triage Vitals  Enc Vitals Group     BP 05/15/15 1100 144/68 mmHg     Pulse Rate 05/15/15 1100 89     Resp 05/15/15 1100 20     Temp 05/15/15 1100 98.2 F (36.8 C)     Temp Source 05/15/15 1100 Oral     SpO2 05/15/15 1100 92 %     Weight 05/15/15 1100 200 lb (90.719 kg)     Height 05/15/15 1100 5\' 4"  (1.626 m)     Head Cir --      Peak Flow --      Pain Score --      Pain Loc --      Pain Edu? --      Excl. in [place]? --      Constitutional: Alert and oriented. Well appearing overall but quite concerned that she has a nasal clamp in place.Marland Kitchen HEENT   Head: Normocephalic and atraumatic.      Eyes: Conjunctivae are normal. PERRL. Normal extraocular movements.      Ears:         Nose:  Hyperemia left anterior nasal symptoms without an obvious source. Some blood clot inferiorly which was not dislodged on the left naris.   Mouth/Throat: Mucous membranes are moist. Blood in mouth.   Neck: No stridor. Cardiovascular/Chest: Normal rate, regular rhythm.  No murmurs, rubs, or gallops. Respiratory: Normal respiratory effort without tachypnea nor retractions. Breath sounds are clear and equal bilaterally. No wheezes/rales/rhonchi. Gastrointestinal: Soft. No distention, no guarding, no rebound. Nontender.    Genitourinary/rectal:Deferred Musculoskeletal: Nontender with normal range of motion in all extremities.  Neurologic:  Normal speech and language. No gross or focal neurologic deficits are appreciated. Skin:  Skin is warm, dry and intact. No rash noted. Ecchymosis right hand. Psychiatric: Mood and affect are normal. Speech and behavior are normal. Patient exhibits appropriate insight and judgment.  ____________________________________________   EKG I, Lisa Roca, MD, the attending physician have personally viewed and interpreted all ECGs.   None ____________________________________________  LABS (pertinent positives/negatives)  Hemoglobin 12.1, platelet count 263  ____________________________________________  RADIOLOGY All Xrays were viewed by me. Imaging interpreted by Radiologist.  None __________________________________________  PROCEDURES  Procedure(s) performed: None  Critical Care performed: None  ____________________________________________   ED COURSE / ASSESSMENT AND PLAN  Pertinent labs & imaging results that were available during my care of the patient were reviewed by me and considered in my medical decision making (see chart for details).   Patient was in initially reported to be hypertensive by EMS, but here her blood pressure was 140s over 60s. She had a nasal clamp in place.   Nasal clamp was removed and patient was observed with  direct visualization, and no specific bleeding source was identified that could be cauterized. There is a blood clot in the left nare which was not dislodged.  Patient reported a fair amount of bleeding over the last couple days, and so I did check her hemoglobin and this was stable. Her platelet count was also within normal limits. I did discuss with her stopping her aspirin for a few days to allow healing.    CONSULTATIONS:   None   Patient / Family / Caregiver informed of clinical course, medical decision-making process, and agree with plan.   I discussed return precautions, follow-up instructions, and discharged instructions with patient and/or family.   ___________________________________________   FINAL CLINICAL IMPRESSION(S) /  ED DIAGNOSES   Final diagnoses:  Epistaxis              Note: This dictation was prepared with Dragon dictation. Any transcriptional errors that result from this process are unintentional   Lisa Roca, MD 05/15/15 [phone]

## 2015-05-15 NOTE — ED Notes (Signed)
Patient given oral care and washed up with wash cloth.

## 2015-05-15 NOTE — Discharge Instructions (Signed)
Continue to apply 15-20 minutes of direct pressure in case of recurrent nosebleed. You may use aspirin 1 spray each nostril every 12 hours for the next 3 days as needed if you have recurrent nosebleed.  If you are continuing to have intermittent nosebleeds next week, you may make an appointment with ENT, Dr. Orson Slick office info provided.  Stop your aspirin for 4 days.  Return to the emergency department if you have a nosebleed that does not stop after pressure for 20 minutes, or certainly any complications such as dizziness or passing out, or any other symptoms concerning to you.   Nosebleed Nosebleeds are common. They are due to a crack in the inside lining of your nose (mucous membrane) or from a small blood vessel that starts to bleed. Nosebleeds can be caused by many conditions, such as injury, infections, dry mucous membranes or dry climate, medicines, nose picking, and home heating and cooling systems. Most nosebleeds come from blood vessels in the front of your nose. HOME CARE INSTRUCTIONS   Try controlling your nosebleed by pinching your nostrils gently and continuously for at least 10 minutes.  Avoid blowing or sniffing your nose for a number of hours after having a nosebleed.  Do not put gauze inside your nose yourself. If your nose was packed by your health care provider, try to maintain the pack inside of your nose until your health care provider removes it.  If a gauze pack was used and it starts to fall out, gently replace it or cut off the end of it.  If a balloon catheter was used to pack your nose, do not cut or remove it unless your health care provider has instructed you to do that.  Avoid lying down while you are having a nosebleed. Sit up and lean forward.  Use a nasal spray decongestant to help with a nosebleed as directed by your health care provider.  Do not use petroleum jelly or mineral oil in your nose. These can drip into your lungs.  Maintain humidity in  your home by using less air conditioning or by using a humidifier.  Aspirinand blood thinners make bleeding more likely. If you are prescribed these medicines and you suffer from nosebleeds, ask your health care provider if you should stop taking the medicines or adjust the dose. Do not stop medicines unless directed by your health care provider  Resume your normal activities as you are able, but avoid straining, lifting, or bending at the waist for several days.  If your nosebleed was caused by dry mucous membranes, use over-the-counter saline nasal spray or gel. This will keep the mucous membranes moist and allow them to heal. If you must use a lubricant, choose the water-soluble variety. Use it only sparingly, and do not use it within several hours of lying down.  Keep all follow-up visits as directed by your health care provider. This is important. SEEK MEDICAL CARE IF:  You have a fever.  You get frequent nosebleeds.  You are getting nosebleeds more often. SEEK IMMEDIATE MEDICAL CARE IF:  Your nosebleed lasts longer than 20 minutes.  Your nosebleed occurs after an injury to your face, and your nose looks crooked or broken.  You have unusual bleeding from other parts of your body.  You have unusual bruising on other parts of your body.  You feel light-headed or you faint.  You become sweaty.  You vomit blood.  Your nosebleed occurs after a head injury.   This information is  not intended to replace advice given to you by your health care provider. Make sure you discuss any questions you have with your health care provider.   Document Released: 11/10/2004 Document Revised: 02/21/2014 Document Reviewed: 09/16/2013 Elsevier Interactive Patient Education [employer].

## 2015-05-15 NOTE — ED Notes (Signed)
Pt to ED from home c/o epistaxis x2 days.  Pt has not taken medications today, given Afrin en route, BP 195/100.  Pt states has had 3 nose bleeds this week, no hx prior to this week according to patient.  Pt A&Ox4, speaking in complete and coherent sentences and in NAD at this time.

## 2015-09-08 ENCOUNTER — Emergency Department: Payer: Medicare Other

## 2015-09-08 ENCOUNTER — Inpatient Hospital Stay
Admission: EM | Admit: 2015-09-08 | Discharge: 2015-09-11 | DRG: 194 | Disposition: A | Discharge status: Skilled Nursing Facility | Payer: Medicare Other | Attending: Internal Medicine | Admitting: Internal Medicine

## 2015-09-08 DIAGNOSIS — Z888 Allergy status to other drugs, medicaments and biological substances status: Secondary | ICD-10-CM

## 2015-09-08 DIAGNOSIS — R0602 Shortness of breath: Secondary | ICD-10-CM

## 2015-09-08 DIAGNOSIS — Z7982 Long term (current) use of aspirin: Secondary | ICD-10-CM

## 2015-09-08 DIAGNOSIS — Z79899 Other long term (current) drug therapy: Secondary | ICD-10-CM | POA: Diagnosis not present

## 2015-09-08 DIAGNOSIS — M109 Gout, unspecified: Secondary | ICD-10-CM | POA: Diagnosis present

## 2015-09-08 DIAGNOSIS — F419 Anxiety disorder, unspecified: Secondary | ICD-10-CM | POA: Diagnosis present

## 2015-09-08 DIAGNOSIS — J189 Pneumonia, unspecified organism: Principal | ICD-10-CM | POA: Diagnosis present

## 2015-09-08 DIAGNOSIS — E785 Hyperlipidemia, unspecified: Secondary | ICD-10-CM | POA: Diagnosis present

## 2015-09-08 DIAGNOSIS — F329 Major depressive disorder, single episode, unspecified: Secondary | ICD-10-CM | POA: Diagnosis present

## 2015-09-08 DIAGNOSIS — I10 Essential (primary) hypertension: Secondary | ICD-10-CM | POA: Diagnosis present

## 2015-09-08 DIAGNOSIS — Z881 Allergy status to other antibiotic agents status: Secondary | ICD-10-CM | POA: Diagnosis not present

## 2015-09-08 DIAGNOSIS — E039 Hypothyroidism, unspecified: Secondary | ICD-10-CM | POA: Diagnosis present

## 2015-09-08 DIAGNOSIS — N390 Urinary tract infection, site not specified: Secondary | ICD-10-CM | POA: Diagnosis present

## 2015-09-08 DIAGNOSIS — Z8249 Family history of ischemic heart disease and other diseases of the circulatory system: Secondary | ICD-10-CM | POA: Diagnosis not present

## 2015-09-08 DIAGNOSIS — J181 Lobar pneumonia, unspecified organism: Secondary | ICD-10-CM | POA: Diagnosis present

## 2015-09-08 LAB — CBC WITH DIFFERENTIAL/PLATELET
Basophils Absolute: 0.1 10*3/uL (ref 0–0.1)
Basophils Relative: 1 %
EOS PCT: 3 %
Eosinophils Absolute: 0.3 10*3/uL (ref 0–0.7)
HEMATOCRIT: 39 % (ref 35.0–47.0)
Hemoglobin: 12.4 g/dL (ref 12.0–16.0)
LYMPHS PCT: 8 %
Lymphs Abs: 0.6 10*3/uL — ABNORMAL LOW (ref 1.0–3.6)
MCH: 27.3 pg (ref 26.0–34.0)
MCHC: 31.8 g/dL — AB (ref 32.0–36.0)
MCV: 86 fL (ref 80.0–100.0)
MONO ABS: 0.9 10*3/uL (ref 0.2–0.9)
MONOS PCT: 11 %
NEUTROS ABS: 6.2 10*3/uL (ref 1.4–6.5)
Neutrophils Relative %: 77 %
Platelets: 243 10*3/uL (ref 150–440)
RBC: 4.54 MIL/uL (ref 3.80–5.20)
RDW: 15.8 % — AB (ref 11.5–14.5)
WBC: 8 10*3/uL (ref 3.6–11.0)

## 2015-09-08 LAB — BASIC METABOLIC PANEL
Anion gap: 10 (ref 5–15)
BUN: 24 mg/dL — AB (ref 6–20)
CALCIUM: 9.2 mg/dL (ref 8.9–10.3)
CO2: 26 mmol/L (ref 22–32)
CREATININE: 1.36 mg/dL — AB (ref 0.44–1.00)
Chloride: 101 mmol/L (ref 101–111)
GFR calc Af Amer: 39 mL/min — ABNORMAL LOW (ref >60)
GFR calc non Af Amer: 34 mL/min — ABNORMAL LOW (ref >60)
GLUCOSE: 107 mg/dL — AB (ref 65–99)
Potassium: 4.2 mmol/L (ref 3.5–5.1)
Sodium: 137 mmol/L (ref 135–145)

## 2015-09-08 LAB — TROPONIN I: Troponin I: <0.03 ng/mL (ref <0.03)

## 2015-09-08 LAB — TSH: TSH: 2.37 u[IU]/mL (ref 0.350–4.500)

## 2015-09-08 MED ORDER — ENOXAPARIN SODIUM 40 MG/0.4ML ~~LOC~~ SOLN
40.0000 mg | SUBCUTANEOUS | Status: DC
  Administered 2015-09-09 – 2015-09-10 (×2): 40 mg via SUBCUTANEOUS
  Filled 2015-09-08 (×2): qty 0.4

## 2015-09-08 MED ORDER — SIMVASTATIN 20 MG PO TABS
20.0000 mg | ORAL_TABLET | Freq: Every day | ORAL | Status: DC
  Administered 2015-09-08 – 2015-09-10 (×3): 20 mg via ORAL
  Filled 2015-09-08 (×3): qty 1

## 2015-09-08 MED ORDER — LEVOTHYROXINE SODIUM 50 MCG PO TABS
100.0000 ug | ORAL_TABLET | Freq: Every day | ORAL | Status: DC
  Administered 2015-09-09 – 2015-09-11 (×2): 100 ug via ORAL
  Filled 2015-09-08 (×3): qty 2

## 2015-09-08 MED ORDER — HYDRALAZINE HCL 25 MG PO TABS
25.0000 mg | ORAL_TABLET | Freq: Three times a day (TID) | ORAL | Status: DC
  Administered 2015-09-08 – 2015-09-11 (×8): 25 mg via ORAL
  Filled 2015-09-08 (×8): qty 1

## 2015-09-08 MED ORDER — METOPROLOL TARTRATE 50 MG PO TABS
50.0000 mg | ORAL_TABLET | Freq: Two times a day (BID) | ORAL | Status: DC
  Administered 2015-09-09 – 2015-09-11 (×5): 50 mg via ORAL
  Filled 2015-09-08 (×5): qty 1

## 2015-09-08 MED ORDER — ASPIRIN EC 81 MG PO TBEC
81.0000 mg | DELAYED_RELEASE_TABLET | Freq: Every day | ORAL | Status: DC
  Administered 2015-09-08 – 2015-09-10 (×3): 81 mg via ORAL
  Filled 2015-09-08 (×3): qty 1

## 2015-09-08 MED ORDER — ESCITALOPRAM OXALATE 10 MG PO TABS
20.0000 mg | ORAL_TABLET | Freq: Every day | ORAL | Status: DC
  Administered 2015-09-09 – 2015-09-11 (×3): 20 mg via ORAL
  Filled 2015-09-08 (×3): qty 2

## 2015-09-08 MED ORDER — AMLODIPINE BESYLATE 10 MG PO TABS
10.0000 mg | ORAL_TABLET | Freq: Every day | ORAL | Status: DC
  Administered 2015-09-09 – 2015-09-11 (×3): 10 mg via ORAL
  Filled 2015-09-08 (×3): qty 1

## 2015-09-08 MED ORDER — VANCOMYCIN HCL IN DEXTROSE 750-5 MG/150ML-% IV SOLN
750.0000 mg | INTRAVENOUS | Status: DC
  Administered 2015-09-09: 750 mg via INTRAVENOUS
  Filled 2015-09-08: qty 150

## 2015-09-08 MED ORDER — VANCOMYCIN HCL IN DEXTROSE 1-5 GM/200ML-% IV SOLN
1000.0000 mg | Freq: Once | INTRAVENOUS | Status: AC
  Administered 2015-09-08: 1000 mg via INTRAVENOUS
  Filled 2015-09-08: qty 200

## 2015-09-08 MED ORDER — ALBUTEROL SULFATE (2.5 MG/3ML) 0.083% IN NEBU
2.5000 mg | INHALATION_SOLUTION | Freq: Once | RESPIRATORY_TRACT | Status: AC
  Administered 2015-09-08: 2.5 mg via RESPIRATORY_TRACT
  Filled 2015-09-08: qty 3

## 2015-09-08 MED ORDER — LEVOFLOXACIN IN D5W 250 MG/50ML IV SOLN
250.0000 mg | INTRAVENOUS | Status: DC
  Administered 2015-09-09 – 2015-09-10 (×2): 250 mg via INTRAVENOUS
  Filled 2015-09-08 (×3): qty 50

## 2015-09-08 MED ORDER — BUPROPION HCL ER (XL) 150 MG PO TB24
150.0000 mg | ORAL_TABLET | Freq: Every day | ORAL | Status: DC
  Administered 2015-09-09 – 2015-09-11 (×3): 150 mg via ORAL
  Filled 2015-09-08 (×3): qty 1

## 2015-09-08 MED ORDER — LEVOFLOXACIN IN D5W 250 MG/50ML IV SOLN: 250.0000 mg | INTRAVENOUS | Status: DC

## 2015-09-08 MED ORDER — VITAMIN D 1000 UNITS PO TABS
2000.0000 [IU] | ORAL_TABLET | Freq: Every day | ORAL | Status: DC
  Administered 2015-09-09 – 2015-09-11 (×3): 2000 [IU] via ORAL
  Filled 2015-09-08 (×4): qty 2

## 2015-09-08 MED ORDER — LEVOFLOXACIN IN D5W 750 MG/150ML IV SOLN
750.0000 mg | Freq: Once | INTRAVENOUS | Status: AC
  Administered 2015-09-08: 750 mg via INTRAVENOUS
  Filled 2015-09-08: qty 150

## 2015-09-08 NOTE — ED Provider Notes (Signed)
[facility] Emergency Department Provider Note  ____________________________________________  Time seen: Approximately 610 PM  I have reviewed the triage vital signs and the nursing notes.   HISTORY  Chief Complaint Shortness of Breath   HPI DIAVION PINDER is a 82 y.o. female with a history of anxiety and hypertension who is presenting to the emergency department with shortness of breath that started this past Saturday. She denies any pain. No cough or fever. Says that she had had a pneumonia about 2 years ago that presented similarly. She is not a smoker. Patient also is being treated concomitantly for urinary tract infection with Bactrim.   Past Medical History:  Diagnosis Date  . Anxiety   . Gout   . Hypertension   . Hypothyroidism   . Thyroid disease     Patient Active Problem List   Diagnosis Date Noted  . Compression fracture of L3 lumbar vertebra ([place]) 09/12/2014  . Intractable back pain 09/12/2014    Past Surgical History:  Procedure Laterality Date  . BACK SURGERY      Current Outpatient Rx  . Order #: [id number] Class: Print  . Order #: [id number] Class: Historical Med  . Order #: [id number] Class: Historical Med  . Order #: [id number] Class: Historical Med  . Order #: [id number] Class: Normal  . Order #: [id number] Class: Historical Med  . Order #: [id number] Class: Print  . Order #: [id number] Class: Historical Med    Allergies Codeine; Tetracyclines & related; Adhesive [tape]; Keflex [cephalexin]; and Naproxen  Family History  Problem Relation Age of Onset  . Hypertension Other     Social History Social History  Substance Use Topics  . Smoking status: Never Smoker  . Smokeless tobacco: Never Used  . Alcohol use No    Review of Systems Constitutional: No fever/chills Eyes: No visual changes. ENT: No sore throat. Cardiovascular: Denies chest pain. Respiratory: As above Gastrointestinal: No abdominal pain.  No nausea, no  vomiting.  No diarrhea.  No constipation. Genitourinary: Negative for dysuria. Musculoskeletal: Negative for back pain. Skin: Negative for rash. Neurological: Negative for headaches, focal weakness or numbness.  10-point ROS otherwise negative.  ____________________________________________   PHYSICAL EXAM:  VITAL SIGNS: ED Triage Vitals [09/08/15 1700]  Enc Vitals Group     BP 111/80     Pulse Rate 80     Resp 20     Temp 97.4 F (36.3 C)     Temp Source Oral     SpO2 97 %     Weight 208 lb (94.3 kg)     Height 5\' 3"  (1.6 m)     Head Circumference      Peak Flow      Pain Score      Pain Loc      Pain Edu?      Excl. in [place]?     Constitutional: Alert and oriented. Well appearing and in no acute distress. Eyes: Conjunctivae are normal. PERRL. EOMI. Head: Atraumatic. Nose: No congestion/rhinnorhea. Mouth/Throat: Mucous membranes are moist.  Oropharynx non-erythematous. Neck: No stridor.   Cardiovascular: Normal rate, regular rhythm. Grossly normal heart sounds.  Good peripheral circulation. Respiratory: Normal respiratory effort.  Rales to the left lower field.  Gastrointestinal: Soft and nontender. No distention. Musculoskeletal: No lower extremity tenderness nor edema.  No joint effusions. Neurologic:  Normal speech and language. No gross focal neurologic deficits are appreciated. No gait instability. Skin:  Skin is warm, dry and intact. No rash noted. Psychiatric: Mood and affect are normal. Speech  and behavior are normal.  ____________________________________________   LABS (all labs ordered are listed, but only abnormal results are displayed)  Labs Reviewed  CBC WITH DIFFERENTIAL/PLATELET  BASIC METABOLIC PANEL  TROPONIN I   ____________________________________________  EKG  ED ECG REPORT I, Schaevitz,  Youlanda Roys, the attending physician, personally viewed and interpreted this ECG.   Date: 09/08/2015  EKG Time: 1805  Rate: 79  Rhythm: Normal sinus  rhythm  Axis: Normal  Intervals:left anterior fascicular block  ST&T Change: No ST segment elevation or depression. No abnormal T-wave inversion.  ____________________________________________  RADIOLOGY  CLINICAL DATA:  Shortness of breath. EXAM: CHEST  2 VIEW COMPARISON:  09/12/2014. FINDINGS: Lungs are hyperexpanded. Retrocardiac collapse/ consolidation with probable effusion noted. Bronchiectasis with probable scarring noted at the right lung base. Cardiopericardial silhouette is mildly enlarged. Bones are diffusely demineralized. IMPRESSION: Retrocardiac collapse/consolidation with small effusion. Scarring at the right lung base. Electronically Signed   By: Misty Stanley M.D.   On: 09/08/2015 17:37 ____________________________________________   PROCEDURES  Procedures ____________________________________________   INITIAL IMPRESSION / ASSESSMENT AND PLAN / ED COURSE  Pertinent labs & imaging results that were available during my care of the patient were reviewed by me and considered in my medical decision making (see chart for details).  Patient hypoxic on room air 88% at rest. Requiring nasal cannula oxygen which is brought up to 95%. We'll treat for community required pneumonia and admitted to the hospital. Pending labs at this time. Signed out to Dr. Verdell Carmine.  Clinical Course     ____________________________________________   FINAL CLINICAL IMPRESSION(S) / ED DIAGNOSES  Retrocardiac community acquired pneumonia.    NEW MEDICATIONS STARTED DURING THIS VISIT:  New Prescriptions   No medications on file     Note:  This document was prepared using Dragon voice recognition software and may include unintentional dictation errors.    Orbie Pyo, MD 09/08/15 [phone]

## 2015-09-08 NOTE — ED Notes (Signed)
Pt sating at 88% on RA, placed on 2Ls, MD made aware.

## 2015-09-08 NOTE — ED Triage Notes (Signed)
Pt to ED via [facility] from [facility] c/o SOB. Per EMS pt has been experiencing nausea and vomiting for 3-4 days and went to [facility] to be evaluated. While at [facility] pt began to feel short of breath, and sating at 88% on RA. Pt alert and oriented in no acute distress at this time.

## 2015-09-08 NOTE — H&P (Signed)
[place] [facility] [place] [name]    MR#:  [id number]  DATE OF BIRTH:  [birth year]  DATE OF ADMISSION:  09/08/2015  PRIMARY CARE PHYSICIAN: [name], MD   REQUESTING/REFERRING PHYSICIAN: Orbie Pyo, MD  CHIEF COMPLAINT:   Chief Complaint  Patient presents with  . Shortness of Breath    HISTORY OF PRESENT ILLNESS:  Andrea Schaefer  is a 82 y.o. female with a known history of Anxiety, hypothyroidism is being admitted for possible pneumonia. She started having shortness of breath for about a week and nausea for the past three days. EMT came to the home and suggested she use the oxygen tank, but she never did. She also has not been eating or drinking. She had pneumonia two years ago, and her son was concerned that she may have pneumonia again.  She lives with her handicapped son at home.  Her other son reports that she fell out of the bed about 2-3 weeks ago but no major injury.  She is feeling short of breath constantly for about a week now. PAST MEDICAL HISTORY:   Past Medical History:  Diagnosis Date  . Anxiety   . Gout   . Hypertension   . Hypothyroidism   . Thyroid disease    PAST SURGICAL HISTORY:   Past Surgical History:  Procedure Laterality Date  . BACK SURGERY      SOCIAL HISTORY:   Social History  Substance Use Topics  . Smoking status: Never Smoker  . Smokeless tobacco: Never Used  . Alcohol use No    FAMILY HISTORY:   Family History  Problem Relation Age of Onset  . Hypertension Other     DRUG ALLERGIES:   Allergies  Allergen Reactions  . Codeine Nausea And Vomiting  . Tetracyclines & Related Nausea And Vomiting  . Adhesive [Tape] Rash  . Keflex [Cephalexin] Rash  . Naproxen Rash    REVIEW OF SYSTEMS:   Review of Systems  Constitutional: Positive for malaise/fatigue. Negative for chills, fever and weight loss.  HENT: Negative for nosebleeds and sore throat.   Eyes: Negative for  blurred vision.  Respiratory: Positive for shortness of breath. Negative for cough and wheezing.   Cardiovascular: Negative for chest pain, orthopnea, leg swelling and PND.  Gastrointestinal: Negative for abdominal pain, constipation, diarrhea, heartburn, nausea and vomiting.  Genitourinary: Negative for dysuria and urgency.  Musculoskeletal: Positive for falls. Negative for back pain.  Skin: Negative for rash.  Neurological: Positive for tremors and weakness. Negative for dizziness, speech change, focal weakness and headaches.  Endo/Heme/Allergies: Does not bruise/bleed easily.  Psychiatric/Behavioral: Negative for depression.    MEDICATIONS AT HOME:   Prior to Admission medications   Medication Sig Start Date End Date Taking? Authorizing Provider  amLODipine (NORVASC) 10 MG tablet Take 1 tablet (10 mg total) by mouth daily. 09/18/14  Yes Demetrios Loll, MD  aspirin EC 81 MG tablet Take 81 mg by mouth at bedtime.    Yes Historical Provider, MD  buPROPion (WELLBUTRIN XL) 150 MG 24 hr tablet Take 150 mg by mouth daily.   Yes Historical Provider, MD  cholecalciferol (VITAMIN D) 1000 units tablet Take 2,000 Units by mouth daily.   Yes Historical Provider, MD  escitalopram (LEXAPRO) 20 MG tablet Take 20 mg by mouth daily.   Yes Historical Provider, MD  hydrALAZINE (APRESOLINE) 25 MG tablet Take 1 tablet (25 mg total) by mouth 3 (three) times daily. 09/18/14  Yes  Demetrios Loll, MD  levothyroxine (SYNTHROID, LEVOTHROID) 100 MCG tablet Take 100 mcg by mouth daily before breakfast.   Yes Historical Provider, MD  metoprolol (LOPRESSOR) 50 MG tablet Take 1 tablet (50 mg total) by mouth 2 (two) times daily. 09/18/14  Yes Demetrios Loll, MD  simvastatin (ZOCOR) 20 MG tablet Take 20 mg by mouth at bedtime.   Yes Historical Provider, MD      VITAL SIGNS:  Blood pressure 111/80, pulse 80, temperature 97.4 F (36.3 C), temperature source Oral, resp. rate 20, height 5\' 3"  (1.6 m), weight 94.3 kg (208 lb), SpO2 97  %.  PHYSICAL EXAMINATION:  Physical Exam  Constitutional: She is oriented to person, place, and time and well-developed, well-nourished, and in no distress.  HENT:  Head: Normocephalic and atraumatic.  Eyes: Conjunctivae and EOM are normal. Pupils are equal, round, and reactive to light.  Neck: Normal range of motion. Neck supple. No tracheal deviation present. No thyromegaly present.  Cardiovascular: Normal rate, regular rhythm and normal heart sounds.   Pulmonary/Chest: Effort normal and breath sounds normal. No respiratory distress. She has no wheezes. She exhibits no tenderness.  Abdominal: Soft. Bowel sounds are normal. She exhibits no distension. There is no tenderness.  Musculoskeletal: Normal range of motion.  Neurological: She is alert and oriented to person, place, and time. She displays tremor. No cranial nerve deficit.  Skin: Skin is warm and dry. No rash noted.  Psychiatric: Mood and affect normal.  Nursing note and vitals reviewed.  LABORATORY PANEL:   CBC  Recent Labs Lab 09/08/15 1849  WBC 8.0  HGB 12.4  HCT 39.0  PLT 243   ------------------------------------------------------------------------------------------------------------------  Chemistries   Recent Labs Lab 09/08/15 1849  NA 137  K 4.2  CL 101  CO2 26  GLUCOSE 107*  BUN 24*  CREATININE 1.36*  CALCIUM 9.2   ------------------------------------------------------------------------------------------------------------------  Cardiac Enzymes  Recent Labs Lab 09/08/15 1849  TROPONINI <0.03   ------------------------------------------------------------------------------------------------------------------  RADIOLOGY:  Dg Chest 2 View  Result Date: 09/08/2015 CLINICAL DATA:  Shortness of breath. EXAM: CHEST  2 VIEW COMPARISON:  09/12/2014. FINDINGS: Lungs are hyperexpanded. Retrocardiac collapse/ consolidation with probable effusion noted. Bronchiectasis with probable scarring noted at the  right lung base. Cardiopericardial silhouette is mildly enlarged. Bones are diffusely demineralized. IMPRESSION: Retrocardiac collapse/consolidation with small effusion. Scarring at the right lung base. Electronically Signed   By: Misty Stanley M.D.   On: 09/08/2015 17:37  IMPRESSION AND PLAN:  82 year old female with a known history of hypertension, hypothyroidism, and is being admitted for pneumonia  * Pneumonia - Start IV Levaquin - Sputum culture if able - Repeat chest x-ray in the morning - Consider CT chest if not much improvement  * Hypothyroidism - Check TSH and continue levothyroxine  * Depression - Continue Wellbutrin and Lexapro  * Weakness and falls - We will get physical and occupational therapy assessment - May benefit from placement and/or home health nursing, PT    All the records are reviewed and case discussed with ED provider. Management plans discussed with the patient, family ( d/w son at bedside) and they are in agreement.  CODE STATUS: Full code  TOTAL TIME TAKING CARE OF THIS PATIENT: 45 minutes.    [facility], Trinten Boudoin M.D on 09/08/2015 at 7:41 PM  Between 7am to 6pm - Pager - [phone]  After 6pm go to [email or web address] - Proofreader  [facility]  Office  [phone]  CC: Primary care physician; [name], MD  Note: This dictation was prepared with Dragon dictation along with smaller phrase technology. Any transcriptional errors that result from this process are unintentional.

## 2015-09-08 NOTE — Progress Notes (Signed)
Pharmacy Antibiotic Note  Andrea Schaefer is a 82 y.o. female admitted on 09/08/2015 with pneumonia.  Pharmacy has been consulted for vancomycin & levofloxacin dosing.  Plan: Patient received levofloxacin 750 mg dose in ED today. Will follow with levofloxacin 250 mg IV q24h  Patient received vancomycin 1000 mg dose in ED. Will order maintenance dose of 750 mg IV q24h to begin tomorrow morning Goal vancomycin trough 15-20 mcg/mL Vancomycin trough ordered for 7/28 @ 0530  Kinetics: Adjusted body weight = 69 kg Ke: 0.03 Half-life: 23 hrs Cmin (estimated): ~15 mcg/mL  Height: 5\' 3"  (160 cm) Weight: 208 lb (94.3 kg) IBW/kg (Calculated) : 52.4  Temp (24hrs), Avg:97.4 F (36.3 C), Min:97.4 F (36.3 C), Max:97.4 F (36.3 C)   Recent Labs Lab 09/08/15 1849  WBC 8.0  CREATININE 1.36*    Estimated Creatinine Clearance: 31.2 mL/min (by C-G formula based on SCr of 1.36 mg/dL).    Allergies  Allergen Reactions  . Codeine Nausea And Vomiting  . Tetracyclines & Related Nausea And Vomiting  . Adhesive [Tape] Rash  . Keflex [Cephalexin] Rash  . Naproxen Rash    Antimicrobials this admission: vancomycin 7/25 >>  levofloxacin 7/25 >>   Dose adjustments this admission:  Microbiology results:  Thank you for allowing pharmacy to be a part of this patient's care.  Lenis Noon, PharmD, BCPS Clinical Pharmacist 09/08/2015 7:54 PM

## 2015-09-08 NOTE — ED Notes (Signed)
Patient transported to X-ray 

## 2015-09-09 ENCOUNTER — Inpatient Hospital Stay: Payer: Medicare Other

## 2015-09-09 LAB — BASIC METABOLIC PANEL
Anion gap: 8 (ref 5–15)
BUN: 22 mg/dL — ABNORMAL HIGH (ref 6–20)
CALCIUM: 8.6 mg/dL — AB (ref 8.9–10.3)
CHLORIDE: 101 mmol/L (ref 101–111)
CO2: 26 mmol/L (ref 22–32)
CREATININE: 1.32 mg/dL — AB (ref 0.44–1.00)
GFR calc non Af Amer: 35 mL/min — ABNORMAL LOW (ref >60)
GFR, EST AFRICAN AMERICAN: 40 mL/min — AB (ref >60)
Glucose, Bld: 94 mg/dL (ref 65–99)
Potassium: 3.7 mmol/L (ref 3.5–5.1)
SODIUM: 135 mmol/L (ref 135–145)

## 2015-09-09 LAB — CBC WITH DIFFERENTIAL/PLATELET
BASOS ABS: 0 10*3/uL (ref 0–0.1)
BASOS PCT: 1 %
EOS ABS: 0.3 10*3/uL (ref 0–0.7)
Eosinophils Relative: 5 %
HCT: 34.8 % — ABNORMAL LOW (ref 35.0–47.0)
HEMOGLOBIN: 11.3 g/dL — AB (ref 12.0–16.0)
LYMPHS ABS: 0.6 10*3/uL — AB (ref 1.0–3.6)
Lymphocytes Relative: 9 %
MCH: 27.9 pg (ref 26.0–34.0)
MCHC: 32.4 g/dL (ref 32.0–36.0)
MCV: 86 fL (ref 80.0–100.0)
Monocytes Absolute: 1 10*3/uL — ABNORMAL HIGH (ref 0.2–0.9)
Monocytes Relative: 15 %
NEUTROS PCT: 70 %
Neutro Abs: 4.7 10*3/uL (ref 1.4–6.5)
Platelets: 212 10*3/uL (ref 150–440)
RBC: 4.05 MIL/uL (ref 3.80–5.20)
RDW: 16.2 % — ABNORMAL HIGH (ref 11.5–14.5)
WBC: 6.7 10*3/uL (ref 3.6–11.0)

## 2015-09-09 NOTE — Progress Notes (Signed)
[facility] [place] [name]    MR#:  [id number]  DATE OF BIRTH:  17-Sep-1924  SUBJECTIVE:  Did not sleep well last nite No cough or fever  REVIEW OF SYSTEMS:   Review of Systems  Constitutional: Positive for malaise/fatigue. Negative for chills, fever and weight loss.  HENT: Negative for ear discharge, ear pain and nosebleeds.   Eyes: Negative for blurred vision, pain and discharge.  Respiratory: Negative for sputum production, shortness of breath, wheezing and stridor.   Cardiovascular: Negative for chest pain, palpitations, orthopnea and PND.  Gastrointestinal: Negative for abdominal pain, diarrhea, nausea and vomiting.  Genitourinary: Negative for frequency and urgency.  Musculoskeletal: Negative for back pain and joint pain.  Neurological: Positive for weakness. Negative for sensory change, speech change and focal weakness.  Psychiatric/Behavioral: Negative for depression and hallucinations. The patient is not nervous/anxious.   All other systems reviewed and are negative.  Tolerating Diet: Tolerating PT:   DRUG ALLERGIES:   Allergies  Allergen Reactions  . Codeine Nausea And Vomiting  . Tetracyclines & Related Nausea And Vomiting  . Adhesive [Tape] Rash  . Keflex [Cephalexin] Rash  . Naproxen Rash    VITALS:  Blood pressure (!) 152/55, pulse 83, temperature 99.3 F (37.4 C), temperature source Oral, resp. rate 16, height 5\' 3"  (1.6 m), weight 94.3 kg (208 lb), SpO2 90 %.  PHYSICAL EXAMINATION:   Physical Exam  GENERAL:  82 y.o.-year-old patient lying in the bed with no acute distress. obese EYES: Pupils equal, round, reactive to light and accommodation. No scleral icterus. Extraocular muscles intact.  HEENT: Head atraumatic, normocephalic. Oropharynx and nasopharynx clear.  NECK:  Supple, no jugular venous distention. No thyroid enlargement, no tenderness.  LUNG; decreased breath sounds bilaterally, no  wheezing, rales, rhonchi. No use of accessory muscles of respiration.  CARDIOVASCULAR: S1, S2 normal. No murmurs, rubs, or gallops.  ABDOMEN: Soft, nontender, nondistended. Bowel sounds present. No organomegaly or mass.  EXTREMITIES: No cyanosis, clubbing or edema b/l.    NEUROLOGIC: Cranial nerves II through XII are intact. No focal Motor or sensory deficits b/l.   PSYCHIATRIC:  patient is alert and oriented x 3.  SKIN: No obvious rash, lesion, or ulcer.   LABORATORY PANEL:  CBC  Recent Labs Lab 09/09/15 0426  WBC 6.7  HGB 11.3*  HCT 34.8*  PLT 212    Chemistries   Recent Labs Lab 09/09/15 0426  NA 135  K 3.7  CL 101  CO2 26  GLUCOSE 94  BUN 22*  CREATININE 1.32*  CALCIUM 8.6*   Cardiac Enzymes  Recent Labs Lab 09/08/15 1849  TROPONINI <0.03   RADIOLOGY:  Dg Chest 2 View  Result Date: 09/09/2015 CLINICAL DATA:  Shortness of breath for 4 days EXAM: CHEST  2 VIEW COMPARISON:  September 08, 2015 FINDINGS: There is persistent left lower lobe airspace consolidation with small left effusion. There is chronic interstitial thickening bilaterally. There is bronchiectatic change in the right base. There is cardiomegaly with pulmonary venous hypertension. There is atherosclerotic calcification aorta. Bones are osteoporotic. No adenopathy evident. IMPRESSION: Persistent left lower lobe airspace consolidation with small left effusion. Underlying pulmonary vascular congestion. There may be mild chronic congestive heart failure. There is aortic atherosclerosis. Bones osteoporotic. Electronically Signed   By: Lowella Grip III M.D.   On: 09/09/2015 07:35  Dg Chest 2 View  Result Date: 09/08/2015 CLINICAL DATA:  Shortness of breath. EXAM: CHEST  2 VIEW COMPARISON:  09/12/2014. FINDINGS: Lungs are hyperexpanded. Retrocardiac collapse/ consolidation with probable effusion noted. Bronchiectasis with probable scarring noted at the right lung base. Cardiopericardial silhouette is mildly  enlarged. Bones are diffusely demineralized. IMPRESSION: Retrocardiac collapse/consolidation with small effusion. Scarring at the right lung base. Electronically Signed   By: Misty Stanley M.D.   On: 09/08/2015 17:37  ASSESSMENT AND PLAN:  82 year old female with a known history of hypertension, hypothyroidism, and is being admitted for pneumonia  * Left LL pneumoniaPneumonia - cont IV Levaquin - Sputum culture if able - Repeat chest x-ray remains the same - Consider CT chest if not much improvement  * Hypothyroidism - continue levothyroxine  * Depression - Continue Wellbutrin and Lexapro  * Weakness and falls -  physical and occupational therapy assessment noted recommends rehab  CSW for d/c planning    Case discussed with Care Management/Social Worker. Management plans discussed with the patient, family and they are in agreement.  CODE STATUS: FULL  DVT Prophylaxis: lovenox  TOTAL TIME TAKING CARE OF THIS PATIENT: 30 minutes.  >50% time spent on counselling and coordination of care  POSSIBLE D/C IN 1-2DAYS, DEPENDING ON CLINICAL CONDITION.  Note: This dictation was prepared with Dragon dictation along with smaller phrase technology. Any transcriptional errors that result from this process are unintentional.  Banesa Tristan M.D on 09/09/2015 at 11:32 AM  Between 7am to 6pm - Pager - [phone]  After 6pm go to [email or web address] - password [email or web address] [facility]  Office  [phone]  CC: Primary care physician; [name], MD

## 2015-09-09 NOTE — Progress Notes (Signed)
Clinical Social Worker (CSW) met with patient and son to present bed offers. Patient and son chose [facility]. Plan is for patient to D/C to [facility] on Friday 09/11/15 pending medical clearance. Kim admissions coordinator at [facility] is aware of above. CSW will continue to follow and assist as needed.    , LCSW [phone] 

## 2015-09-09 NOTE — Care Management Note (Signed)
Case Management Note  Patient Details  [name] MRN: [id number] Date of Birth: 02-17-1924  Subjective/Objective:      Spoke with patient and son Andrea Schaefer who is at the bedside. Patient is HOH and finds it difficult to talk at this time due to PNA  SOB and phlegm. Son Andrea Schaefer stated that she lives with his brother [name] who lives at home and is her caregiver. He stated that he does all the household chores and is there pretty much 24/7. Andrea Schaefer lives in [place] and stated that he helps with most of the decisions.   He stated that the patient has all DME at home including a Front rolling walker, cane, BSC, and shower chair. He is looking into a ramp on the front of the house which has 3 steps.  PT has worked with the patient this morning and recommended  SNF. Son is agreeable to this CSW is following.  [facility] provider list given to patient and patient has home O2 with [facility] she thinks. PCP is Engineer, materials is [employer].         Action/Plan: SNF at discharge.   Expected Discharge Date:                  Expected Discharge Plan:  [place]  In-House Referral:  Clinical Social Work  Discharge planning Services  CM Consult  Post Acute Care Choice:  NA Choice offered to:  Adult Children  DME Arranged:    DME Agency:  [facility]  HH Arranged:  NA HH Agency:     Status of Service:  In process, will continue to follow  If discussed at Long Length of Stay Meetings, dates discussed:    Additional Comments:  Alvie Heidelberg, RN 09/09/2015, 9:54 AM

## 2015-09-09 NOTE — NC FL2 (Signed)
[place] LEVEL OF CARE SCREENING TOOL     IDENTIFICATION  Patient [name] Birthdate: 1924/11/29 Sex: female Admission Date (Current Location): 09/08/2015  [place] and [place] Number:  Engineering geologist and Address:  [facility], [address], [place], [place] [address]      Provider Number: [id number]  Attending Physician Name and Address:  Fritzi Mandes, MD  Relative Name and Phone Number:       Current Level of Care: Hospital Recommended Level of Care: [place] Prior Approval Number:    Date Approved/Denied:   PASRR Number:  ([id number] (PASARR can be looked up using the D.O.B May 16, 1924))  Discharge Plan: SNF    Current Diagnoses: Patient Active Problem List   Diagnosis Date Noted  . Pneumonia 09/08/2015  . Compression fracture of L3 lumbar vertebra ([place]) 09/12/2014  . Intractable back pain 09/12/2014    Orientation RESPIRATION BLADDER Height & Weight     Self, Time, Situation, Place  O2 (1.5 Liters Oxygen ) Incontinent Weight: 208 lb (94.3 kg) Height:  5\' 3"  (160 cm)  BEHAVIORAL SYMPTOMS/MOOD NEUROLOGICAL BOWEL NUTRITION STATUS   (none )  (none ) Continent Diet (Regular Diet )  AMBULATORY STATUS COMMUNICATION OF NEEDS Skin   Extensive Assist Verbally Normal                       Personal Care Assistance Level of Assistance  Bathing, Feeding, Dressing Bathing Assistance: Limited assistance Feeding assistance: Independent Dressing Assistance: Limited assistance     Functional Limitations Info  Sight, Hearing, Speech Sight Info: Adequate Hearing Info: Adequate Speech Info: Adequate    SPECIAL CARE FACTORS FREQUENCY  PT (By licensed PT), OT (By licensed OT)     PT Frequency:  (5) OT Frequency:  (5)            Contractures      Additional Factors Info  Code Status, Allergies, Psychotropic Code Status Info:  (Full Code. ) Allergies Info:  (Codeine, Tetracyclines &  Related, Adhesive Tape, Keflex Cephalexin, Naproxen) Psychotropic Info:  (Lexapro )         Current Medications (09/09/2015):  This is the current hospital active medication list Current Facility-Administered Medications  Medication Dose Route Frequency Provider Last Rate Last Dose  . amLODipine (NORVASC) tablet 10 mg  10 mg Oral Daily Max Sane, MD   10 mg at 09/09/15 0915  . aspirin EC tablet 81 mg  81 mg Oral QHS Max Sane, MD   81 mg at 09/08/15 2143  . buPROPion (WELLBUTRIN XL) 24 hr tablet 150 mg  150 mg Oral Daily Max Sane, MD   150 mg at 09/09/15 0914  . cholecalciferol (VITAMIN D) tablet 2,000 Units  2,000 Units Oral Daily Max Sane, MD   2,000 Units at 09/09/15 0914  . enoxaparin (LOVENOX) injection 40 mg  40 mg Subcutaneous Q24H Vipul Shah, MD      . escitalopram (LEXAPRO) tablet 20 mg  20 mg Oral Daily Vipul Shah, MD   20 mg at 09/09/15 0914  . hydrALAZINE (APRESOLINE) tablet 25 mg  25 mg Oral TID Max Sane, MD   25 mg at 09/09/15 0914  . Levofloxacin (LEVAQUIN) IVPB 250 mg  250 mg Intravenous Q24H Vipul Shah, MD      . levothyroxine (SYNTHROID, LEVOTHROID) tablet 100 mcg  100 mcg Oral QAC breakfast Max Sane, MD   100 mcg at 09/09/15 0915  . metoprolol (LOPRESSOR) tablet  50 mg  50 mg Oral BID Max Sane, MD   50 mg at 09/09/15 0914  . simvastatin (ZOCOR) tablet 20 mg  20 mg Oral QHS Max Sane, MD   20 mg at 09/08/15 2143  . vancomycin (VANCOCIN) IVPB 750 mg/150 ml premix  750 mg Intravenous Q24H Lenis Noon, RPH   750 mg at 09/09/15 [id number]     Discharge Medications: Please see discharge summary for a list of discharge medications.  Relevant Imaging Results:  Relevant Lab Results:   Additional Information  (SSN: [id number])  Jakeem Grape, [name], LCSW

## 2015-09-09 NOTE — Progress Notes (Signed)
Patient asked nursing staff for tissue. Shortly after tissue given patient complaints of nose bleeding. Patient states" I have long nails and was scratching a old sore". Left nare has old dry scant blood. Scant Blood noted inside mouth. Patient denies pain. Education given to patient.WIll report to oncoming shift

## 2015-09-09 NOTE — Care Management Important Message (Signed)
Important Message  Patient Details  Name: DUAA BEVERIDGE MRN: [id number] Date of Birth: 22-May-1924   Medicare Important Message Given:  Yes    Alvie Heidelberg, RN 09/09/2015, 9:53 AM

## 2015-09-09 NOTE — Clinical Social Work Placement (Signed)
   CLINICAL SOCIAL WORK PLACEMENT  NOTE  Date:  09/09/2015  Patient Details  [name] MRN: [id number] Date of Birth: 1924-04-07  Clinical Social Work is seeking post-discharge placement for this patient at the [place] level of care (*CSW will initial, date and re-position this form in  chart as items are completed):  Yes   Patient/family provided with [place] Work Department's list of facilities offering this level of care within the geographic area requested by the patient (or if unable, by the patient's family).  Yes   Patient/family informed of their freedom to choose among providers that offer the needed level of care, that participate in Medicare, Medicaid or managed care program needed by the patient, have an available bed and are willing to accept the patient.  Yes   Patient/family informed of [facility]'s ownership interest in [facility] and [facility], as well as of the fact that they are under no obligation to receive care at these facilities.  PASRR submitted to EDS on       PASRR number received on       Existing PASRR number confirmed on 09/09/15     FL2 transmitted to all facilities in geographic area requested by pt/family on 09/09/15     FL2 transmitted to all facilities within larger geographic area on       Patient informed that his/her managed care company has contracts with or will negotiate with certain facilities, including the following:            Patient/family informed of bed offers received.  Patient chooses bed at       Physician recommends and patient chooses bed at      Patient to be transferred to   on  .  Patient to be transferred to facility by       Patient family notified on   of transfer.  Name of family member notified:        PHYSICIAN       Additional Comment:    _______________________________________________ Mclean Moya, [name], LCSW 09/09/2015, 10:33 AM

## 2015-09-09 NOTE — Evaluation (Signed)
Occupational Therapy Evaluation Patient Details [name] MRN: [id number] DOB: 06-Sep-1924 Today's Date: 09/09/2015    History of Present Illness Pt. is an 82 yo Female who presented to the ED from [facility] with SOB.  She was admitted to [facility] with pneumonia. PMH includes: HTN, hypothyroidism, L3 compression fx with surgical repair.   Clinical Impression   Pt. Is an 82 y.o. Female who was admitted to [facility] with Pneumonia, and SOB. Pt. Presents with decreased activity tolerance, tremors, impaired functional mobility and weakness which hinders ADL performance. Pt. Benefits from skilled OT services for ADL and A/E training, energy conservation, work simplification, therapeutic ex., and pt. education about home modifications in order to improve ADL and IADL functioning and return to her PLOF. Pt. has good family support at home.Pt. SO2 level was 93%-90% on 1.5L.     Follow Up Recommendations  SNF    Equipment Recommendations       Recommendations for Other Services PT consult     Precautions / Restrictions Precautions Precautions: Fall Restrictions Weight Bearing Restrictions: No              ADL Overall ADL's : Needs assistance/impaired Eating/Feeding: Set up (Pt. has a history of tremors per pt. famliy report which has not interfered with self-feeding, utensil use in the past. )   Grooming: Set up               Lower Body Dressing: Moderate assistance               Functional mobility during ADLs: Moderate assistance General ADL Comments: Pt. education was provided about energy conservation, and work simplification techniques.     Vision     Perception     Praxis      Pertinent Vitals/Pain Pain Assessment: No/denies pain     Hand Dominance     Extremity/Trunk Assessment Upper Extremity Assessment Upper Extremity Assessment: Generalized weakness       Communication Communication Communication: No difficulties   Cognition  Arousal/Alertness: Awake/alert Behavior During Therapy: Anxious Overall Cognitive Status: Within Functional Limits for tasks assessed                     General Comments       Exercises   Shoulder Instructions      Home Living Family/patient expects to be discharged to:: Private residence Living Arrangements: Children Available Help at Discharge: Family Type of Home: House Home Access: Stairs to enter Technical brewer of Steps: 3 Entrance Stairs-Rails: Left Home Layout: One level     Bathroom Shower/Tub: Occupational psychologist: Standard Bathroom Accessibility: Yes   Home Equipment: Environmental consultant - 2 wheels, BSC, Shower seat          Prior Functioning/Environment Level of Independence: Independent with assistive device(s)        Comments: Household ambulation with FWW, independent with basic ADLs, not driving    OT Diagnosis: Generalized weakness   OT Problem List: Decreased strength;Decreased knowledge of use of DME or AE;Decreased activity tolerance   OT Treatment/Interventions: Self-care/ADL training;Therapeutic exercise;Therapeutic activities;Energy conservation    OT Goals(Current goals can be found in the care plan section) Acute Rehab OT Goals Patient Stated Goal: To feel better OT Goal Formulation: With patient Potential to Achieve Goals: Good  OT Frequency: Min 1X/week   Barriers to D/C:            Co-evaluation  End of Session    Activity Tolerance: Patient tolerated treatment well Patient left: in chair;with call bell/phone within reach;with chair alarm set   Time: 1030-1105 OT Time Calculation (min): 35 min Charges:  OT General Charges $OT Visit: 1 Procedure OT Evaluation $OT Eval Moderate Complexity: 1 Procedure G-Codes:    Harrel Carina, MS, OTR/L Harrel Carina 09/09/2015, 12:16 PM

## 2015-09-09 NOTE — Clinical Social Work Note (Signed)
Clinical Social Work Assessment  Patient Details  [name] MRN: [id number] Date of Birth: 1924-03-10  Date of referral:  09/09/15               Reason for consult:  Facility Placement                Permission sought to share information with:  [name] granted to share information::  Yes, Verbal Permission Granted  Name::      [place]::   [place]   Relationship::     Contact Information:     Housing/Transportation Living arrangements for the past 2 months:  [place] of Information:  Patient, Adult Children Patient Interpreter Needed:  None Criminal Activity/Legal Involvement Pertinent to Current Situation/Hospitalization:  No - Comment as needed Significant Relationships:  Adult Children Lives with:  Adult Children Do you feel safe going back to the place where you live?  Yes Need for family participation in patient care:  Yes (Comment)  Care giving concerns:  Patient lives with her son Andrea Schaefer in [place].    Social Worker assessment / plan:  Holiday representative (CSW) received verbal consult from MD that patient will likely need SNF. PT is recommending SNF. CSW met with patient's son Andrea Schaefer [phone] outside of patient's room. CSW is familiar with patient and son from last admission in Aug. 2016. Patient was discharged to [facility] on 09/18/14 and per son stayed about 6 weeks. Son reported that patient has not been to SNF since last year. Son reported that he lives in [place] and patient lives with her other son Andrea Schaefer. CSW explained that PT is recommending SNF this admission and patient will require a 3 night qualifying inpatient stay at a hospital in order for Medicare to pay for SNF. Patient was admitted to inpatient on 09/08/15. Son verbalized his understanding, is agreeable to SNF search and prefers [facility] CSW met with patient alone at bedside to discuss D/C plan. Patient is  agreeable to SNF search and prefers [facility] again. Patient reported that her deceased husband was a Designer, television/film set and she receives her supplemental insurance through the [employer].  FL2 complete and faxed out. CSW will continue to follow and assist as needed.   Employment status:  Retired Forensic scientist:  Medicare PT Recommendations:  [place] / Referral to community resources:  [place]  Patient/Family's Response to care:  Patient and son are agreeable to [facility] and prefer [facility]   Patient/Family's Understanding of and Emotional Response to Diagnosis, Current Treatment, and Prognosis:  Patient and son were pleasant and thanked CSW for visit.   Emotional Assessment Appearance:  Appears stated age Attitude/Demeanor/Rapport:    Affect (typically observed):  Accepting, Adaptable, Pleasant Orientation:  Oriented to Self, Oriented to Place, Oriented to  Time Alcohol / Substance use:  Not Applicable Psych involvement (Current and /or in the community):  No (Comment)  Discharge Needs  Concerns to be addressed:  Discharge Planning Concerns Readmission within the last 30 days:  No Current discharge risk:  Chronically ill, Dependent with Mobility Barriers to Discharge:  Continued Medical Work up   [name], [name], LCSW 09/09/2015, 10:35 AM

## 2015-09-09 NOTE — Evaluation (Signed)
Physical Therapy Evaluation Patient Details [name] MRN: [id number] DOB: 11-16-1924 Today's Date: 09/09/2015   History of Present Illness  82 yo F presented to ED from [facility] with SOB. She went to [facility] for c/o nausea and vomitting for 3 days. She was admitted with pneumonia. PMH includes HTN, hypothyroidism, L3 compression fx with surgical repair.  Clinical Impression  Pt demonstrated generalized weakness and difficulty walking during evaluation. She required mod A for bed mobility. Transfers and limited ambulation up to 5 ft with FWW required min A with +2 for safety. She becomes SOB with minimal to no activity and SpO2 90% at rest on 1.5 L. STR is recommended after hospital discharge to address deficits of strength, balance, endurance, and gait to increase functional independence prior to returning home. Pt will benefit from skilled PT services to increase functional I and mobility for safe discharge.     Follow Up Recommendations SNF    Equipment Recommendations  None recommended by PT (pt has recommended FWW)    Recommendations for Other Services       Precautions / Restrictions Precautions Precautions: Fall Restrictions Weight Bearing Restrictions: No      Mobility  Bed Mobility Overal bed mobility: Needs Assistance Bed Mobility: Supine to Sit     Supine to sit: Mod assist;HOB elevated     General bed mobility comments: cues for reaching and using rail, difficulty getting trunk upright  Transfers Overall transfer level: Needs assistance Equipment used: Rolling walker (2 wheeled) Transfers: Sit to/from [facility] Sit to Stand: Min assist;+2 safety/equipment Stand pivot transfers: Min assist;+2 safety/equipment       General transfer comment: Mod cues for staying close to FWW, pursed lip breathing  Ambulation/Gait Ambulation/Gait assistance: Min assist;+2 safety/equipment Ambulation Distance (Feet): 5 Feet Assistive device:  Rolling walker (2 wheeled) Gait Pattern/deviations: Step-to pattern;Shuffle;Trunk flexed;Narrow base of support Gait velocity: reduced Gait velocity interpretation: Below normal speed for age/gender General Gait Details: very slow, SOB, cues for pursed lip breathing  Stairs            Wheelchair Mobility    Modified Rankin (Stroke Patients Only)       Balance Overall balance assessment: Needs assistance;History of Falls Sitting-balance support: Bilateral upper extremity supported;Feet supported Sitting balance-Leahy Scale: Good     Standing balance support: Bilateral upper extremity supported Standing balance-Leahy Scale: Poor Standing balance comment: required min A to maintain balance                             Pertinent Vitals/Pain Pain Assessment: No/denies pain    Home Living Family/patient expects to be discharged to:: Private residence Living Arrangements: Children (her handicapped son lives with her, no physical A needed) Available Help at Discharge: Family Type of Home: House Home Access: Stairs to enter Entrance Stairs-Rails: Left Entrance Stairs-Number of Steps: 3 Home Layout: One level Home Equipment: Environmental consultant - 2 wheels      Prior Function Level of Independence: Independent with assistive device(s)         Comments: Household ambulation with FWW, independent with basic ADLs, not driving     Hand Dominance        Extremity/Trunk Assessment   Upper Extremity Assessment: Generalized weakness           Lower Extremity Assessment: Generalized weakness (grossly 3+/5)         Communication   Communication: No difficulties  Cognition Arousal/Alertness: Awake/alert Behavior  During Therapy: Anxious Overall Cognitive Status: Within Functional Limits for tasks assessed                      General Comments General comments (skin integrity, edema, etc.): 1.5 L O2, anxious    Exercises Other Exercises Other  Exercises: B LE seated therex: ankle pumps, LAQs, marching, hip abd with manual resistance x10 each. Cues for technique and pursed lip breathing to maintain O2 saturation.      Assessment/Plan    PT Assessment Patient needs continued PT services  PT Diagnosis Difficulty walking;Generalized weakness   PT Problem List Decreased strength;Decreased activity tolerance;Decreased balance;Decreased mobility;Cardiopulmonary status limiting activity;Obesity  PT Treatment Interventions DME instruction;Gait training;Stair training;Therapeutic activities;Therapeutic exercise;Balance training;Neuromuscular re-education;Patient/family education   PT Goals (Current goals can be found in the Care Plan section) Acute Rehab PT Goals Patient Stated Goal: to feel better PT Goal Formulation: With patient/family Time For Goal Achievement: 09/23/15 Potential to Achieve Goals: Fair    Frequency Min 2X/week   Barriers to discharge Inaccessible home environment;Decreased caregiver support steps to enter, lives with handicapped son    Co-evaluation               End of Session Equipment Utilized During Treatment: Gait belt;Oxygen Activity Tolerance: Patient limited by fatigue Patient left: in chair;with call bell/phone within reach;with chair alarm set;with family/visitor present;with SCD's reapplied Nurse Communication: Mobility status         Time: [id number] PT Time Calculation (min) (ACUTE ONLY): 29 min   Charges:   PT Evaluation $PT Eval Moderate Complexity: 1 Procedure PT Treatments $Therapeutic Exercise: 8-22 mins   PT G Codes:        Neoma Laming, PT, DPT  09/09/15, 10:11 AM [phone]

## 2015-09-10 LAB — HIV ANTIBODY (ROUTINE TESTING W REFLEX): HIV Screen 4th Generation wRfx: NONREACTIVE

## 2015-09-10 LAB — LEGIONELLA PNEUMOPHILA SEROGP 1 UR AG: L. PNEUMOPHILA SEROGP 1 UR AG: NEGATIVE

## 2015-09-10 MED ORDER — ACETAMINOPHEN 325 MG PO TABS
650.0000 mg | ORAL_TABLET | Freq: Four times a day (QID) | ORAL | Status: DC | PRN
  Administered 2015-09-10: 650 mg via ORAL
  Filled 2015-09-10: qty 2

## 2015-09-10 NOTE — Progress Notes (Signed)
[facility] [place] [name]    MR#:  [id number]  DATE OF BIRTH:  09-13-1924  SUBJECTIVE:  Out in the chair. Doing well No cough or fever  REVIEW OF SYSTEMS:   Review of Systems  Constitutional: Positive for malaise/fatigue. Negative for chills, fever and weight loss.  HENT: Negative for ear discharge, ear pain and nosebleeds.   Eyes: Negative for blurred vision, pain and discharge.  Respiratory: Negative for sputum production, shortness of breath, wheezing and stridor.   Cardiovascular: Negative for chest pain, palpitations, orthopnea and PND.  Gastrointestinal: Negative for abdominal pain, diarrhea, nausea and vomiting.  Genitourinary: Negative for frequency and urgency.  Musculoskeletal: Negative for back pain and joint pain.  Neurological: Positive for weakness. Negative for sensory change, speech change and focal weakness.  Psychiatric/Behavioral: Negative for depression and hallucinations. The patient is not nervous/anxious.   All other systems reviewed and are negative.  Tolerating Diet:yes Tolerating PT: STR  DRUG ALLERGIES:   Allergies  Allergen Reactions  . Codeine Nausea And Vomiting  . Tetracyclines & Related Nausea And Vomiting  . Adhesive [Tape] Rash  . Keflex [Cephalexin] Rash  . Naproxen Rash    VITALS:  Blood pressure (!) 129/58, pulse 81, temperature 98.1 F (36.7 C), temperature source Oral, resp. rate 18, height 5\' 3"  (1.6 m), weight 94.3 kg (208 lb), SpO2 93 %.  PHYSICAL EXAMINATION:   Physical Exam  GENERAL:  82 y.o.-year-old patient lying in the bed with no acute distress. obese EYES: Pupils equal, round, reactive to light and accommodation. No scleral icterus. Extraocular muscles intact.  HEENT: Head atraumatic, normocephalic. Oropharynx and nasopharynx clear.  NECK:  Supple, no jugular venous distention. No thyroid enlargement, no tenderness.  LUNG; decreased breath sounds  bilaterally, no wheezing, rales, rhonchi. No use of accessory muscles of respiration.  CARDIOVASCULAR: S1, S2 normal. No murmurs, rubs, or gallops.  ABDOMEN: Soft, nontender, nondistended. Bowel sounds present. No organomegaly or mass.  EXTREMITIES: No cyanosis, clubbing or edema b/l.    NEUROLOGIC: Cranial nerves II through XII are intact. No focal Motor or sensory deficits b/l.   PSYCHIATRIC:  patient is alert and oriented x 3.  SKIN: No obvious rash, lesion, or ulcer.   LABORATORY PANEL:  CBC  Recent Labs Lab 09/09/15 0426  WBC 6.7  HGB 11.3*  HCT 34.8*  PLT 212    Chemistries   Recent Labs Lab 09/09/15 0426  NA 135  K 3.7  CL 101  CO2 26  GLUCOSE 94  BUN 22*  CREATININE 1.32*  CALCIUM 8.6*   Cardiac Enzymes  Recent Labs Lab 09/08/15 1849  TROPONINI <0.03   RADIOLOGY:  Dg Chest 2 View  Result Date: 09/09/2015 CLINICAL DATA:  Shortness of breath for 4 days EXAM: CHEST  2 VIEW COMPARISON:  September 08, 2015 FINDINGS: There is persistent left lower lobe airspace consolidation with small left effusion. There is chronic interstitial thickening bilaterally. There is bronchiectatic change in the right base. There is cardiomegaly with pulmonary venous hypertension. There is atherosclerotic calcification aorta. Bones are osteoporotic. No adenopathy evident. IMPRESSION: Persistent left lower lobe airspace consolidation with small left effusion. Underlying pulmonary vascular congestion. There may be mild chronic congestive heart failure. There is aortic atherosclerosis. Bones osteoporotic. Electronically Signed   By: Lowella Grip III M.D.   On: 09/09/2015 07:35  Dg Chest 2 View  Result Date: 09/08/2015 CLINICAL DATA:  Shortness of breath. EXAM: CHEST  2 VIEW COMPARISON:  09/12/2014. FINDINGS: Lungs are hyperexpanded. Retrocardiac collapse/ consolidation with probable effusion noted. Bronchiectasis with probable scarring noted at the right lung base. Cardiopericardial  silhouette is mildly enlarged. Bones are diffusely demineralized. IMPRESSION: Retrocardiac collapse/consolidation with small effusion. Scarring at the right lung base. Electronically Signed   By: Misty Stanley M.D.   On: 09/08/2015 17:37  ASSESSMENT AND PLAN:  82 year old female with a known history of hypertension, hypothyroidism, and is being admitted for pneumonia  * Left LL pneumoniaPneumonia - cont IV Levaquin - BC negative - Repeat chest x-ray remains the same - Improving slowly, afebrile  * Hypothyroidism - continue levothyroxine  * Depression - Continue Wellbutrin and Lexapro  * Weakness and falls -  physical and occupational therapy assessment noted recommends rehab  CSW for d/c planning Pt will go to rehab in am-friday  Case discussed with Care Management/Social Worker. Management plans discussed with the patient, family and they are in agreement.  CODE STATUS: FULL  DVT Prophylaxis: lovenox  TOTAL TIME TAKING CARE OF THIS PATIENT: 30 minutes.  >50% time spent on counselling and coordination of care  POSSIBLE D/C IN 1-2DAYS, DEPENDING ON CLINICAL CONDITION.  Note: This dictation was prepared with Dragon dictation along with smaller phrase technology. Any transcriptional errors that result from this process are unintentional.  Shailynn Fong M.D on 09/10/2015 at 10:54 AM  Between 7am to 6pm - Pager - [phone]  After 6pm go to [email or web address] - password [email or web address] [facility]  Office  [phone]  CC: Primary care physician; [name], MD

## 2015-09-11 ENCOUNTER — Encounter
Admission: RE | Admit: 2015-09-11 | Discharge: 2015-09-11 | Disposition: A | Discharge status: Home / Self Care | Payer: Medicare Other | Source: Ambulatory Visit | Attending: Internal Medicine | Admitting: Internal Medicine

## 2015-09-11 MED ORDER — ACETAMINOPHEN 325 MG PO TABS: 650.0000 mg | ORAL_TABLET | Freq: Four times a day (QID) | ORAL | 0 refills | Status: AC | PRN

## 2015-09-11 MED ORDER — LEVOFLOXACIN 250 MG PO TABS: 250.0000 mg | ORAL_TABLET | Freq: Every day | ORAL | 0 refills | Status: DC

## 2015-09-11 MED ORDER — LEVOFLOXACIN 500 MG PO TABS: 250.0000 mg | ORAL_TABLET | Freq: Every day | ORAL | Status: DC

## 2015-09-11 NOTE — Discharge Summary (Signed)
[facility] [name]    MR#:  [id number]  DATE OF BIRTH:  12-17-1924  DATE OF ADMISSION:  09/08/2015 ADMITTING PHYSICIAN: Max Sane, MD  DATE OF DISCHARGE: 09/11/2015  PRIMARY CARE PHYSICIAN: [name], MD    ADMISSION DIAGNOSIS:  CAP (community acquired pneumonia) [J18.9]  DISCHARGE DIAGNOSIS:  Active Problems:   Pneumonia   SECONDARY DIAGNOSIS:   Past Medical History:  Diagnosis Date  . Anxiety   . Gout   . Hypertension   . Hypothyroidism   . Thyroid disease     HOSPITAL COURSE:   82 year old female with past medical history significant for hypertension, hypothyroidism, ambulates with a walker at home presents to the hospital secondary to pneumonia.  #1 left lower lobe pneumonia-chest x-ray with left lower lobe pneumonia. -Blood cultures are negative. Improving, agitations normal on room air. -Continue Levaquin to finish off the course. -Generalized weakness secondary to the same. Will need rehabilitation as recommended by physical therapy.  #2 hypothyroidism-continue Synthroid  #3 hypertension-on metoprolol, oral hydralazine and Norvasc.  #4 hyperlipidemia-continue statin  #5 depression-continue Wellbutrin and Lexapro  Discharge to rehabilitation today   DISCHARGE CONDITIONS:   Guarded  CONSULTS OBTAINED:  Treatment Team:  Gladstone Lighter, MD  DRUG ALLERGIES:   Allergies  Allergen Reactions  . Codeine Nausea And Vomiting  . Tetracyclines & Related Nausea And Vomiting  . Adhesive [Tape] Rash  . Keflex [Cephalexin] Rash  . Naproxen Rash    DISCHARGE MEDICATIONS:   Current Discharge Medication List    START taking these medications   Details  acetaminophen (TYLENOL) 325 MG tablet Take 2 tablets (650 mg total) by mouth every 6 (six) hours as needed for mild pain, moderate pain or headache (headache). Qty: 30 tablet, Refills: 0    levofloxacin (LEVAQUIN) 250 MG  tablet Take 1 tablet (250 mg total) by mouth daily at 6 PM. X 4 more days Qty: 4 tablet, Refills: 0      CONTINUE these medications which have NOT CHANGED   Details  amLODipine (NORVASC) 10 MG tablet Take 1 tablet (10 mg total) by mouth daily. Qty: 30 tablet, Refills: 0    aspirin EC 81 MG tablet Take 81 mg by mouth at bedtime.     buPROPion (WELLBUTRIN XL) 150 MG 24 hr tablet Take 150 mg by mouth daily.    cholecalciferol (VITAMIN D) 1000 units tablet Take 2,000 Units by mouth daily.    escitalopram (LEXAPRO) 20 MG tablet Take 20 mg by mouth daily.    hydrALAZINE (APRESOLINE) 25 MG tablet Take 1 tablet (25 mg total) by mouth 3 (three) times daily. Qty: 90 tablet, Refills: 0    levothyroxine (SYNTHROID, LEVOTHROID) 100 MCG tablet Take 100 mcg by mouth daily before breakfast.    metoprolol (LOPRESSOR) 50 MG tablet Take 1 tablet (50 mg total) by mouth 2 (two) times daily. Qty: 30 tablet, Refills: 0    simvastatin (ZOCOR) 20 MG tablet Take 20 mg by mouth at bedtime.         DISCHARGE INSTRUCTIONS:   1. PCP follow-up in 1-2 weeks   If you experience worsening of your admission symptoms, develop shortness of breath, life threatening emergency, suicidal or homicidal thoughts you must seek medical attention immediately by calling 911 or calling your MD immediately  if symptoms less severe.  You Must read complete instructions/literature along with all the possible adverse reactions/side effects for all the Medicines you take and that  have been prescribed to you. Take any new Medicines after you have completely understood and accept all the possible adverse reactions/side effects.   Please note  You were cared for by a hospitalist during your hospital stay. If you have any questions about your discharge medications or the care you received while you were in the hospital after you are discharged, you can call the unit and asked to speak with the hospitalist on call if the hospitalist  that took care of you is not available. Once you are discharged, your primary care physician will handle any further medical issues. Please note that NO REFILLS for any discharge medications will be authorized once you are discharged, as it is imperative that you return to your primary care physician (or establish a relationship with a primary care physician if you do not have one) for your aftercare needs so that they can reassess your need for medications and monitor your lab values.    Today   CHIEF COMPLAINT:   Chief Complaint  Patient presents with  . Shortness of Breath    VITAL SIGNS:  Blood pressure (!) 152/72, pulse 78, temperature 97.7 F (36.5 C), temperature source Oral, resp. rate 18, height 5\' 3"  (1.6 m), weight 94.3 kg (208 lb), SpO2 93 %.  I/O:  No intake or output data in the 24 hours ending 09/11/15 1054  PHYSICAL EXAMINATION:   Physical Exam  GENERAL:  82 y.o.-year-old Elderly patient lying in the bed with no acute distress.  EYES: Pupils equal, round, reactive to light and accommodation. No scleral icterus. Extraocular muscles intact.  HEENT: Head atraumatic, normocephalic. Oropharynx and nasopharynx clear.  NECK:  Supple, no jugular venous distention. No thyroid enlargement, no tenderness.  LUNGS: Normal breath sounds bilaterally, no wheezing, rales,rhonchi or crepitation. No use of accessory muscles of respiration. Decreased bibasilar breath sounds. CARDIOVASCULAR: S1, S2 normal. No rubs, or gallops. 2/6 systolic murmur is present. ABDOMEN: Soft, non-tender, non-distended. Bowel sounds present. No organomegaly or mass.  EXTREMITIES: No pedal edema, cyanosis, or clubbing.  NEUROLOGIC: Cranial nerves II through XII are intact. Muscle strength 5/5 in all extremities. Sensation intact. Gait not checked. Resting tremors are noted. Global weakness noted. PSYCHIATRIC: The patient is alert and oriented x 3.  SKIN: No obvious rash, lesion, or ulcer.   DATA REVIEW:    CBC  Recent Labs Lab 09/09/15 0426  WBC 6.7  HGB 11.3*  HCT 34.8*  PLT 212    Chemistries   Recent Labs Lab 09/09/15 0426  NA 135  K 3.7  CL 101  CO2 26  GLUCOSE 94  BUN 22*  CREATININE 1.32*  CALCIUM 8.6*    Cardiac Enzymes  Recent Labs Lab 09/08/15 1849  TROPONINI <0.03    Microbiology Results  Results for orders placed or performed during the hospital encounter of 09/08/15  Culture, blood (routine x 2) Call MD if unable to obtain prior to antibiotics being given     Status: None (Preliminary result)   Collection Time: 09/08/15 10:46 PM  Result Value Ref Range Status   Specimen Description BLOOD RIGHT HAND  Final   Special Requests BOTTLES DRAWN AEROBIC AND ANAEROBIC 7ML  Final   Culture NO GROWTH 1 DAY  Final   Report Status PENDING  Incomplete  Culture, blood (routine x 2) Call MD if unable to obtain prior to antibiotics being given     Status: None (Preliminary result)   Collection Time: 09/08/15 10:46 PM  Result Value Ref Range Status  Specimen Description BLOOD RIGHT WRIST  Final   Special Requests BOTTLES DRAWN AEROBIC AND ANAEROBIC 7ML  Final   Culture NO GROWTH 1 DAY  Final   Report Status PENDING  Incomplete    RADIOLOGY:  No results found.  EKG:   Orders placed or performed during the hospital encounter of 09/08/15  . ED EKG  . ED EKG      Management plans discussed with the patient, family and they are in agreement.  CODE STATUS:     Code Status Orders        Start     Ordered   09/08/15 2102  Full code  Continuous     09/08/15 2101    Code Status History    Date Active Date Inactive Code Status Order ID Comments User Context   09/12/2014  8:25 PM 09/18/2014  4:09 PM Full Code [id number]  Lytle Butte, MD ED    Advance Directive Documentation   Flowsheet Row Most Recent Value  Type of Advance Directive  Healthcare Power of Attorney  Pre-existing out of facility DNR order (yellow form or pink MOST form)  No data   "MOST" Form in Place?  No data      TOTAL TIME TAKING CARE OF THIS PATIENT: 37  minutes.    Gladstone Lighter M.D on 09/11/2015 at 10:54 AM  Between 7am to 6pm - Pager - [phone]  After 6pm go to [email or web address] - password [email or web address] [place]  Office  [phone]  CC: Primary care physician; [name], MD

## 2015-09-11 NOTE — Discharge Planning (Addendum)
Family informed that patient may not qualify for transport to [facility] from EMS.  More than likely, there will be a some or all of the cost will not be paid by insurance.  To avoid costs, family should transport to facility.  Famiy agreed and asked to be transported by EMS regardless of possible fees. SW informed.

## 2015-09-11 NOTE — Progress Notes (Signed)
PHARMACIST - PHYSICIAN COMMUNICATION DR:   Tressia Miners CONCERNING: Antibiotic IV to Oral Route Change Policy  RECOMMENDATION: This patient is receiving levoflxoacin by the intravenous route.  Based on criteria approved by the Pharmacy and Therapeutics Committee, the antibiotic(s) is/are being converted to the equivalent oral dose form(s).   DESCRIPTION: These criteria include:  Patient being treated for a respiratory tract infection, urinary tract infection, cellulitis or clostridium difficile associated diarrhea if on metronidazole  The patient is not neutropenic and does not exhibit a GI malabsorption state  The patient is eating (either orally or via tube) and/or has been taking other orally administered medications for a least 24 hours  The patient is improving clinically and has a Tmax < 100.5  If you have questions about this conversion, please contact the Pharmacy Department  []   [phone] )  [facility] [x]   [phone] )  [facility] []   [phone] )  [facility] []   [phone] )  [facility] []   [phone] )  [facility]

## 2015-09-11 NOTE — Clinical Social Work Placement (Signed)
   CLINICAL SOCIAL WORK PLACEMENT  NOTE  Date:  09/11/2015  Patient Details  [name] MRN: [id number] Date of Birth: 01-09-1925  Clinical Social Work is seeking post-discharge placement for this patient at the [place] level of care (*CSW will initial, date and re-position this form in  chart as items are completed):  Yes   Patient/family provided with [facility] Work Department's list of facilities offering this level of care within the geographic area requested by the patient (or if unable, by the patient's family).  Yes   Patient/family informed of their freedom to choose among providers that offer the needed level of care, that participate in Medicare, Medicaid or managed care program needed by the patient, have an available bed and are willing to accept the patient.  Yes   Patient/family informed of [facility]'s ownership interest in [facility] and [facility], as well as of the fact that they are under no obligation to receive care at these facilities.  PASRR submitted to EDS on       PASRR number received on       Existing PASRR number confirmed on 09/09/15     FL2 transmitted to all facilities in geographic area requested by pt/family on 09/09/15     FL2 transmitted to all facilities within larger geographic area on       Patient informed that his/her managed care company has contracts with or will negotiate with certain facilities, including the following:        Yes   Patient/family informed of bed offers received.  Patient chooses bed at  [facility] )     Physician recommends and patient chooses bed at      Patient to be transferred to  [facility] ) on 09/11/15.  Patient to be transferred to facility by  [facility] )     Patient family notified on 09/11/15 of transfer.  Name of family member notified:   (Patient's son [name] is at bedside and aware of D/C today. )     PHYSICIAN        Additional Comment:    _______________________________________________ Keontay Vora, [name], LCSW 09/11/2015, 11:37 AM

## 2015-09-11 NOTE — Discharge Planning (Addendum)
Patient IV X2 removed.  DC papers given explained and educated.  Report called to [facility] S/W Gregary Signs, LPN.  Packet will be sent to facility and EMS contacted to transport to Rm 215.  RN assessment and VS revealed stability for DC  to facility.  Family also informed of transport and room patient will be in.

## 2015-09-11 NOTE — Progress Notes (Signed)
Patient is medically stable for D/C to [facility] today. Per Kim admissions coordinator at [facility] patient will go to room 215. RN will call report at [phone] and arrange EMS for transport. Clinical Education officer, museum (CSW) sent D/C orders to [facility] via [employer]. Patient is aware of above. Patient's son [name] is at bedside and aware of above. Please reconsult if future social work needs arise. CSW signing off.   [employer], LCSW [phone]

## 2015-09-14 LAB — CULTURE, BLOOD (ROUTINE X 2)
Culture: NO GROWTH
Culture: NO GROWTH

## 2015-09-15 ENCOUNTER — Encounter
Admission: RE | Admit: 2015-09-15 | Discharge: 2015-09-15 | Disposition: A | Discharge status: Home / Self Care | Payer: Medicare Other | Source: Ambulatory Visit | Attending: Internal Medicine | Admitting: Internal Medicine

## 2015-09-24 ENCOUNTER — Non-Acute Institutional Stay (SKILLED_NURSING_FACILITY): Payer: Medicare Other | Admitting: Gerontology

## 2015-09-24 DIAGNOSIS — J189 Pneumonia, unspecified organism: Secondary | ICD-10-CM | POA: Diagnosis not present

## 2015-09-24 NOTE — Progress Notes (Signed)
Location:      Place of Service:  SNF (31)  Provider: Toni Arthurs, NP-C  PCP: [name], MD Patient Care Team: [name], MD as PCP - General (Family Medicine)  Extended Emergency Contact Information Primary Emergency Contact: Brereton,James Address: [address], [place] [address] [address] [place] of [place] Phone: [phone] Mobile Phone: [phone] Relation: Son  Code Status: Full Goals of care:  Advanced Directive information Advanced Directives 09/08/2015  Does patient have an advance directive? Yes  Type of Advance Directive [place]  Does patient want to make changes to advanced directive? No - Patient declined  Copy of advanced directive(s) in chart? No - copy requested  Would patient like information on creating an advanced directive? -     Allergies  Allergen Reactions  . Codeine Nausea And Vomiting  . Tetracyclines & Related Nausea And Vomiting  . Adhesive [Tape] Rash  . Keflex [Cephalexin] Rash  . Naproxen Rash    Chief Complaint  Patient presents with  . Discharge Note    HPI:  82 y.o. female was admitted with Pneumonia and completed ABT tx with no adverse reactions noted. Received PT/OT for decreased mobility and independence with ADL's. Improvement was noted. Stay was uneventful. Resident is stable at time of discharge. Discharging home with son and [facility] PT/OT will be provided via [facility]. No DME needed at this time. Prescriptions were faxed to [facility] on [address].      Past Medical History:  Diagnosis Date  . Anxiety   . Gout   . Hypertension   . Hypothyroidism   . Thyroid disease     Past Surgical History:  Procedure Laterality Date  . BACK SURGERY        reports that she has never smoked. She has never used smokeless tobacco. She reports that she does not drink alcohol. Her drug history is not on file. Social History   Social History  . Marital status: Widowed    Spouse name: N/A  .  Number of children: N/A  . Years of education: N/A   Occupational History  . Not on file.   Social History Main Topics  . Smoking status: Never Smoker  . Smokeless tobacco: Never Used  . Alcohol use No  . Drug use: Unknown  . Sexual activity: Not on file   Other Topics Concern  . Not on file   Social History Narrative  . No narrative on file   Functional Status Survey:    Allergies  Allergen Reactions  . Codeine Nausea And Vomiting  . Tetracyclines & Related Nausea And Vomiting  . Adhesive [Tape] Rash  . Keflex [Cephalexin] Rash  . Naproxen Rash    Pertinent  Health Maintenance Due  Topic Date Due  . PNA vac Low Risk Adult (1 of 2 - PCV13) 11/06/1991  . INFLUENZA VACCINE  09/15/2015  . DEXA SCAN  Completed    Medications:   Medication List       Accurate as of 09/24/15 12:56 PM. Always use your most recent med list.          acetaminophen 325 MG tablet Commonly known as:  TYLENOL Take 2 tablets (650 mg total) by mouth every 6 (six) hours as needed for mild pain, moderate pain or headache (headache).   amLODipine 10 MG tablet Commonly known as:  NORVASC Take 1 tablet (10 mg total) by mouth daily.   aspirin EC 81  MG tablet Take 81 mg by mouth at bedtime.   buPROPion 150 MG 24 hr tablet Commonly known as:  WELLBUTRIN XL Take 150 mg by mouth daily.   cholecalciferol 1000 units tablet Commonly known as:  VITAMIN D Take 2,000 Units by mouth daily.   escitalopram 20 MG tablet Commonly known as:  LEXAPRO Take 20 mg by mouth daily.   hydrALAZINE 25 MG tablet Commonly known as:  APRESOLINE Take 1 tablet (25 mg total) by mouth 3 (three) times daily.   levofloxacin 250 MG tablet Commonly known as:  LEVAQUIN Take 1 tablet (250 mg total) by mouth daily at 6 PM. X 4 more days   levothyroxine 100 MCG tablet Commonly known as:  SYNTHROID, LEVOTHROID Take 100 mcg by mouth daily before breakfast.   metoprolol 50 MG tablet Commonly known as:   LOPRESSOR Take 1 tablet (50 mg total) by mouth 2 (two) times daily.   simvastatin 20 MG tablet Commonly known as:  ZOCOR Take 20 mg by mouth at bedtime.       Review of Systems  Constitutional: Negative for activity change, appetite change, chills, diaphoresis and fever.  HENT: Negative for congestion, sneezing, sore throat, trouble swallowing and voice change.   Respiratory: Negative for apnea, cough, choking, chest tightness, shortness of breath and wheezing.   Cardiovascular: Negative for chest pain, palpitations and leg swelling.  Gastrointestinal: Negative for abdominal distention, abdominal pain, constipation, diarrhea and nausea.  Genitourinary: Negative for difficulty urinating, dysuria, frequency and urgency.  Musculoskeletal: Negative for back pain, gait problem and myalgias. Arthralgias: typical arthritis.  Skin: Negative for color change, pallor, rash and wound.  Neurological: Negative for dizziness, tremors, syncope, speech difficulty, weakness, numbness and headaches.  Psychiatric/Behavioral: Negative for agitation and behavioral problems.  All other systems reviewed and are negative.   Vitals:   09/24/15 1253  BP: (!) 141/60  Pulse: 68  Resp: 20  Temp: 98.1 F (36.7 C)  SpO2: 91%   There is no height or weight on file to calculate BMI. Physical Exam  Constitutional: She is oriented to person, place, and time. Vital signs are normal. She appears well-developed and well-nourished. She is active and cooperative. She does not appear ill. No distress.  HENT:  Head: Normocephalic and atraumatic.  Mouth/Throat: Uvula is midline, oropharynx is clear and moist and mucous membranes are normal. Mucous membranes are not pale, not dry and not cyanotic.  Eyes: Conjunctivae, EOM and lids are normal. Pupils are equal, round, and reactive to light.  Neck: Trachea normal, normal range of motion and full passive range of motion without pain. Neck supple. No JVD present. No  tracheal deviation, no edema and no erythema present. No thyromegaly present.  Cardiovascular: Normal rate, regular rhythm, normal heart sounds, intact distal pulses and normal pulses.  Exam reveals no gallop and no distant heart sounds.   Pulmonary/Chest: Effort normal and breath sounds normal. No accessory muscle usage. No respiratory distress. She has no wheezes. She has no rales. She exhibits no tenderness.  Abdominal: Normal appearance and bowel sounds are normal. She exhibits no distension and no ascites. There is no tenderness.  Musculoskeletal: Normal range of motion. She exhibits no edema or tenderness.  Expected osteoarthritis, stiffness  Neurological: She is alert and oriented to person, place, and time. She has normal strength.  Skin: Skin is warm, dry and intact. No rash noted. She is not diaphoretic. No cyanosis or erythema. No pallor. Nails show no clubbing.  Psychiatric: She has a normal  mood and affect. Her speech is normal and behavior is normal. Judgment and thought content normal. Cognition and memory are normal.  Nursing note and vitals reviewed.   Labs reviewed: Basic Metabolic Panel:  Recent Labs  09/08/15 1849 09/09/15 0426  NA 137 135  K 4.2 3.7  CL 101 101  CO2 26 26  GLUCOSE 107* 94  BUN 24* 22*  CREATININE 1.36* 1.32*  CALCIUM 9.2 8.6*   Liver Function Tests: No results for input(s): AST, ALT, ALKPHOS, BILITOT, PROT, ALBUMIN in the last 8760 hours. No results for input(s): LIPASE, AMYLASE in the last 8760 hours. No results for input(s): AMMONIA in the last 8760 hours. CBC:  Recent Labs  05/15/15 1328 09/08/15 1849 09/09/15 0426  WBC 9.9 8.0 6.7  NEUTROABS 8.0* 6.2 4.7  HGB 12.1 12.4 11.3*  HCT 37.0 39.0 34.8*  MCV 87.0 86.0 86.0  PLT 263 243 212   Cardiac Enzymes:  Recent Labs  09/08/15 1849  TROPONINI <0.03   BNP: Invalid input(s): POCBNP CBG: No results for input(s): GLUCAP in the last 8760 hours.  Procedures and Imaging Studies  During Stay: Dg Chest 2 View  Result Date: 09/09/2015 CLINICAL DATA:  Shortness of breath for 4 days EXAM: CHEST  2 VIEW COMPARISON:  September 08, 2015 FINDINGS: There is persistent left lower lobe airspace consolidation with small left effusion. There is chronic interstitial thickening bilaterally. There is bronchiectatic change in the right base. There is cardiomegaly with pulmonary venous hypertension. There is atherosclerotic calcification aorta. Bones are osteoporotic. No adenopathy evident. IMPRESSION: Persistent left lower lobe airspace consolidation with small left effusion. Underlying pulmonary vascular congestion. There may be mild chronic congestive heart failure. There is aortic atherosclerosis. Bones osteoporotic. Electronically Signed   By: Lowella Grip III M.D.   On: 09/09/2015 07:35  Dg Chest 2 View  Result Date: 09/08/2015 CLINICAL DATA:  Shortness of breath. EXAM: CHEST  2 VIEW COMPARISON:  09/12/2014. FINDINGS: Lungs are hyperexpanded. Retrocardiac collapse/ consolidation with probable effusion noted. Bronchiectasis with probable scarring noted at the right lung base. Cardiopericardial silhouette is mildly enlarged. Bones are diffusely demineralized. IMPRESSION: Retrocardiac collapse/consolidation with small effusion. Scarring at the right lung base. Electronically Signed   By: Misty Stanley M.D.   On: 09/08/2015 17:37   Assessment/Plan:   1. Pneumonia, unspecified laterality, unspecified part of lung  Pt completed ABT tx with no adverse reactions noted. Received PT/OT for decreased mobility and independence with ADL's. Improvement was noted. Stay was uneventful. Resident is stable at time of discharge. Discharging home with son and [facility] PT/OT will be provided via [facility]. No DME needed at this time. Prescriptions were faxed to [facility] on [address].   Patient is being discharged with the following home health services:    Patient is being discharged with the following  durable medical equipment:    Patient has been advised to f/u with their PCP in 1-2 weeks to bring them up to date on their rehab stay.  Social services at facility was responsible for arranging this appointment.  Pt was provided with a 30 day supply of prescriptions for medications and refills must be obtained from their PCP.  For controlled substances, a more limited supply may be provided adequate until PCP appointment only.  Future labs/tests needed:  none  Family/ staff Communication:   Total Time: 20 minutes  Documentation: 10 minutes  Face to Face: 10 minutes  Family/Phone: family present during exam  Vikki Ports, NP-C Geriatrics [facility]  [facility] [address], [place] [address] Cell Phone (Mon-Fri 8am-5pm):  [phone] On Call:  [phone] & follow prompts after 5pm & weekends Office Phone:  [phone] Office Fax:  [phone]

## 2015-11-30 ENCOUNTER — Other Ambulatory Visit: Payer: Self-pay | Admitting: Student

## 2015-11-30 DIAGNOSIS — N644 Mastodynia: Secondary | ICD-10-CM

## 2015-11-30 DIAGNOSIS — N6459 Other signs and symptoms in breast: Secondary | ICD-10-CM

## 2015-12-15 ENCOUNTER — Ambulatory Visit
Admission: RE | Admit: 2015-12-15 | Discharge: 2015-12-15 | Disposition: A | Discharge status: Home / Self Care | Payer: Medicare Other | Source: Ambulatory Visit | Attending: Student | Admitting: Student

## 2015-12-15 DIAGNOSIS — Z923 Personal history of irradiation: Secondary | ICD-10-CM | POA: Diagnosis not present

## 2015-12-15 DIAGNOSIS — Z853 Personal history of malignant neoplasm of breast: Secondary | ICD-10-CM | POA: Diagnosis not present

## 2015-12-15 DIAGNOSIS — N6459 Other signs and symptoms in breast: Secondary | ICD-10-CM

## 2015-12-15 DIAGNOSIS — N644 Mastodynia: Secondary | ICD-10-CM

## 2015-12-15 HISTORY — DX: Malignant (primary) neoplasm, unspecified: C80.1

## 2015-12-15 HISTORY — DX: Personal history of irradiation: Z92.3

## 2017-01-11 ENCOUNTER — Emergency Department: Payer: Medicare Other

## 2017-01-11 ENCOUNTER — Emergency Department
Admission: EM | Admit: 2017-01-11 | Discharge: 2017-01-12 | Disposition: A | Discharge status: Home / Self Care | Payer: Medicare Other | Attending: Emergency Medicine | Admitting: Emergency Medicine

## 2017-01-11 ENCOUNTER — Other Ambulatory Visit: Payer: Self-pay

## 2017-01-11 DIAGNOSIS — I10 Essential (primary) hypertension: Secondary | ICD-10-CM | POA: Insufficient documentation

## 2017-01-11 DIAGNOSIS — E039 Hypothyroidism, unspecified: Secondary | ICD-10-CM | POA: Diagnosis not present

## 2017-01-11 DIAGNOSIS — Z7982 Long term (current) use of aspirin: Secondary | ICD-10-CM | POA: Insufficient documentation

## 2017-01-11 DIAGNOSIS — R350 Frequency of micturition: Secondary | ICD-10-CM | POA: Diagnosis present

## 2017-01-11 DIAGNOSIS — Z79899 Other long term (current) drug therapy: Secondary | ICD-10-CM | POA: Insufficient documentation

## 2017-01-11 DIAGNOSIS — R0602 Shortness of breath: Secondary | ICD-10-CM | POA: Insufficient documentation

## 2017-01-11 DIAGNOSIS — N390 Urinary tract infection, site not specified: Secondary | ICD-10-CM | POA: Diagnosis not present

## 2017-01-11 LAB — URINALYSIS, COMPLETE (UACMP) WITH MICROSCOPIC
BILIRUBIN URINE: NEGATIVE
Glucose, UA: NEGATIVE mg/dL
HGB URINE DIPSTICK: NEGATIVE
KETONES UR: NEGATIVE mg/dL
Nitrite: POSITIVE — AB
PROTEIN: 30 mg/dL — AB
SPECIFIC GRAVITY, URINE: 1.012 (ref 1.005–1.030)
pH: 6 (ref 5.0–8.0)

## 2017-01-11 LAB — CBC WITH DIFFERENTIAL/PLATELET
Basophils Absolute: 0.1 10*3/uL (ref 0–0.1)
Basophils Relative: 1 %
EOS ABS: 0.1 10*3/uL (ref 0–0.7)
Eosinophils Relative: 1 %
HEMATOCRIT: 41.9 % (ref 35.0–47.0)
HEMOGLOBIN: 13.7 g/dL (ref 12.0–16.0)
LYMPHS ABS: 1 10*3/uL (ref 1.0–3.6)
Lymphocytes Relative: 9 %
MCH: 27.5 pg (ref 26.0–34.0)
MCHC: 32.7 g/dL (ref 32.0–36.0)
MCV: 84.1 fL (ref 80.0–100.0)
MONO ABS: 0.6 10*3/uL (ref 0.2–0.9)
MONOS PCT: 6 %
NEUTROS ABS: 8.6 10*3/uL — AB (ref 1.4–6.5)
Neutrophils Relative %: 83 %
Platelets: 307 10*3/uL (ref 150–440)
RBC: 4.98 MIL/uL (ref 3.80–5.20)
RDW: 16.1 % — AB (ref 11.5–14.5)
WBC: 10.4 10*3/uL (ref 3.6–11.0)

## 2017-01-11 LAB — BASIC METABOLIC PANEL
Anion gap: 12 (ref 5–15)
BUN: 27 mg/dL — AB (ref 6–20)
CALCIUM: 9.3 mg/dL (ref 8.9–10.3)
CHLORIDE: 99 mmol/L — AB (ref 101–111)
CO2: 23 mmol/L (ref 22–32)
CREATININE: 1 mg/dL (ref 0.44–1.00)
GFR calc Af Amer: 56 mL/min — ABNORMAL LOW (ref >60)
GFR calc non Af Amer: 48 mL/min — ABNORMAL LOW (ref >60)
GLUCOSE: 128 mg/dL — AB (ref 65–99)
Potassium: 4.2 mmol/L (ref 3.5–5.1)
Sodium: 134 mmol/L — ABNORMAL LOW (ref 135–145)

## 2017-01-11 LAB — BRAIN NATRIURETIC PEPTIDE: B Natriuretic Peptide: 229 pg/mL — ABNORMAL HIGH (ref 0.0–100.0)

## 2017-01-11 LAB — TROPONIN I: Troponin I: <0.03 ng/mL (ref <0.03)

## 2017-01-11 NOTE — ED Provider Notes (Addendum)
Dg Chest 2 View  Result Date: 01/11/2017 CLINICAL DATA:  Shortness of breath and cough EXAM: CHEST  2 VIEW COMPARISON:  09/09/2015 FINDINGS: Chronic cardiomegaly. Linear opacities at the bases consistent with scarring. There is no edema, consolidation, effusion, or pneumothorax. Degenerative endplate spurring. Eventration of the right diaphragm. IMPRESSION: No acute finding. Chronic cardiomegaly and lung scarring. Electronically Signed   By: Monte Fantasia M.D.   On: 01/11/2017 23:02     Labs Reviewed  CBC WITH DIFFERENTIAL/PLATELET - Abnormal; Notable for the following components:      Result Value   RDW 16.1 (*)    Neutro Abs 8.6 (*)    All other components within normal limits  BASIC METABOLIC PANEL - Abnormal; Notable for the following components:   Sodium 134 (*)    Chloride 99 (*)    Glucose, Bld 128 (*)    BUN 27 (*)    GFR calc non Af Amer 48 (*)    GFR calc Af Amer 56 (*)    All other components within normal limits  BRAIN NATRIURETIC PEPTIDE - Abnormal; Notable for the following components:   B Natriuretic Peptide 229.0 (*)    All other components within normal limits  URINALYSIS, COMPLETE (UACMP) WITH MICROSCOPIC - Abnormal; Notable for the following components:   Color, Urine YELLOW (*)    APPearance CLOUDY (*)    Protein, ur 30 (*)    Nitrite POSITIVE (*)    Leukocytes, UA LARGE (*)    Bacteria, UA FEW (*)    Squamous Epithelial / LPF 0-5 (*)    All other components within normal limits  URINE CULTURE  TROPONIN I     Clinical Course as of Jan 13 136  Wed Jan 11, 2017  2334 Assuming care from Dr. Archie Balboa.  In short, Andrea Schaefer is a 83 y.o. female with a chief complaint of urinary frequency.  Refer to the original H&P for additional details.  The current plan of care is to follow up UA and reassess.   [CF]  Thu Jan 12, 2017  0013 The patient's urinalysis is grossly positive.  I reviewed the of her lab work and her chest x-ray and everything else is  reassuring.  She reports an adverse reaction of "rash" to Keflex which is my preferred outpatient urinary tract infection antibiotic.  Amoxicillin would be an alternative but I am also concerned about the possibility both of a cross reaction of rash which would lead to medication noncompliance as well as the wrong possibility of diarrhea in this 83 year old patient which could also be debilitating.  According to local antibiogram and infectious disease recommendations, the next best option is Bactrim, which is not my favorite antibiotic for elderly patients but is, in my opinion, the best alternative in this patient's case.  Also it has the benefit of a shorter course of only 3 days with twice daily dosing, as opposed to 3 times daily dosing of amoxicillin which should last for 7 days.  Gave her a first dose in the emergency department prescription, and recommendations to follow-up with her regular doctor at the next available opportunity.  [CF]  0137 I went back into the room at her request and updated the patient with her results, provided reassurance, and encouraged her to follow-up as an outpatient which she states that she will attempt to do tomorrow.  She is in no acute distress, laughing and joking with me, and is alert and oriented and generally well-appearing in spite  of her age.  She has no concerning signs or symptoms at this time for an acute or emergent medical condition that would require her to stay in the hospital.  [CF]    Clinical Course User Index [CF] Hinda Kehr, MD     Final diagnoses:  Urinary tract infection without hematuria, site unspecified     ED Discharge Orders        Ordered    sulfamethoxazole-trimethoprim (BACTRIM DS,SEPTRA DS) 800-160 MG tablet  2 times daily     01/12/17 0011         Hinda Kehr, MD 01/12/17 1497    Hinda Kehr, MD 01/12/17 [phone]

## 2017-01-11 NOTE — ED Triage Notes (Signed)
Pt arrives to ED via [facility] from home with c/o urinary frequency, SHOB, productive cough, and bilateral lower extremity edema. EMS also reports pt is stressed over issues with her "son who is special needs". Pt denies any c/o pain. Dr Archie Balboa at bedside upon pt's arrival to ED Rm 26.

## 2017-01-11 NOTE — ED Provider Notes (Signed)
[facility] Emergency Department Provider Note    ____________________________________________   I have reviewed the triage vital signs and the nursing notes.   HISTORY  Chief Complaint Urinary Frequency; Shortness of Breath; and Leg Swelling   History limited by: Not Limited   HPI Andrea Schaefer is a 83 y.o. female who presents to the emergency department today with multiple medical complaints, including shortness of breath, leg swelling and urinary incontinence.   LOCATION:bilateral leg swelling DURATION:a few days TIMING: constant QUALITY: swelling CONTEXT: patient presented with multiple medical complaints. Seem to be worse the past few days. The patient however states that she has had similar symptoms in the past. MODIFYING FACTORS: none identified ASSOCIATED SYMPTOMS: patient has also had a cough productive of white phlegm.  Per medical record review patient has a history of anxiety, pneumonia.   Past Medical History:  Diagnosis Date  . Anxiety   . Cancer [facility]) 2011   Right  . Gout   . Hypertension   . Hypothyroidism   . Personal history of radiation therapy   . Thyroid disease     Patient Active Problem List   Diagnosis Date Noted  . Pneumonia 09/08/2015  . Compression fracture of L3 lumbar vertebra ([place]) 09/12/2014  . Intractable back pain 09/12/2014    Past Surgical History:  Procedure Laterality Date  . BACK SURGERY    . BREAST BIOPSY Right 2011   +    Prior to Admission medications   Medication Sig Start Date End Date Taking? Authorizing Provider  acetaminophen (TYLENOL) 325 MG tablet Take 2 tablets (650 mg total) by mouth every 6 (six) hours as needed for mild pain, moderate pain or headache (headache). 09/11/15   Gladstone Lighter, MD  amLODipine (NORVASC) 10 MG tablet Take 1 tablet (10 mg total) by mouth daily. 09/18/14   Demetrios Loll, MD  aspirin EC 81 MG tablet Take 81 mg by mouth at bedtime.     [provider]   buPROPion (WELLBUTRIN XL) 150 MG 24 hr tablet Take 150 mg by mouth daily.    [provider]  cholecalciferol (VITAMIN D) 1000 units tablet Take 2,000 Units by mouth daily.    [provider]  escitalopram (LEXAPRO) 20 MG tablet Take 20 mg by mouth daily.    [provider]  hydrALAZINE (APRESOLINE) 25 MG tablet Take 1 tablet (25 mg total) by mouth 3 (three) times daily. 09/18/14   Demetrios Loll, MD  levofloxacin (LEVAQUIN) 250 MG tablet Take 1 tablet (250 mg total) by mouth daily at 6 PM. X 4 more days 09/11/15   Gladstone Lighter, MD  levothyroxine (SYNTHROID, LEVOTHROID) 100 MCG tablet Take 100 mcg by mouth daily before breakfast.    [provider]  metoprolol (LOPRESSOR) 50 MG tablet Take 1 tablet (50 mg total) by mouth 2 (two) times daily. 09/18/14   Demetrios Loll, MD  simvastatin (ZOCOR) 20 MG tablet Take 20 mg by mouth at bedtime.    [provider]    Allergies Codeine; Tetracyclines & related; Adhesive [tape]; Keflex [cephalexin]; and Naproxen  Family History  Problem Relation Age of Onset  . Hypertension Other   . Breast cancer Neg Hx     Social History Social History   Tobacco Use  . Smoking status: Never Smoker  . Smokeless tobacco: Never Used  Substance Use Topics  . Alcohol use: No  . Drug use: Not on file    Review of Systems Constitutional: No fever/chills Eyes: No  visual changes. ENT: No sore throat. Cardiovascular: Denies chest pain. Respiratory: Positive for shortness of breath and cough. Gastrointestinal: No abdominal pain.  No nausea, no vomiting.  No diarrhea.   Genitourinary: Positive for incontinence.  Musculoskeletal: Positive for lower extremity swelling.  Skin: Negative for rash. Neurological: Negative for headaches, focal weakness or numbness.  ____________________________________________   PHYSICAL EXAM:  VITAL SIGNS: ED Triage Vitals  Enc Vitals Group     BP 01/11/17 2224 (!) 152/71     Pulse Rate  01/11/17 2224 77     Resp 01/11/17 2224 20     Temp 01/11/17 2224 98.6 F (37 C)     Temp Source 01/11/17 2224 Oral     SpO2 01/11/17 2224 95 %     Weight 01/11/17 2221 218 lb 9.6 oz (99.2 kg)     Height 01/11/17 2221 '5\' 8"'$  (1.727 m)   Constitutional: Alert and oriented. Well appearing and in no distress. Eyes: Conjunctivae are normal.  ENT   Head: Normocephalic and atraumatic.   Nose: No congestion/rhinnorhea.   Mouth/Throat: Mucous membranes are moist.   Neck: No stridor. Hematological/Lymphatic/Immunilogical: No cervical lymphadenopathy. Cardiovascular: Normal rate, regular rhythm.  No murmurs, rubs, or gallops.  Respiratory: Normal respiratory effort without tachypnea nor retractions. Breath sounds are clear and equal bilaterally. No wheezes/rales/rhonchi. Gastrointestinal: Soft and non tender. No rebound. No guarding.  Genitourinary: Deferred Musculoskeletal: Normal range of motion in all extremities. Bilateral lower extremity swelling.  Neurologic:  Normal speech and language. No gross focal neurologic deficits are appreciated.  Skin:  Skin is warm, dry and intact. No rash noted. Psychiatric: Mood and affect are normal. Speech and behavior are normal. Patient exhibits appropriate insight and judgment.  ____________________________________________    LABS (pertinent positives/negatives)  Trop <0.03 CBC wnl except rdw 16.1 BMP na 134, glu 128, cr 1.00  ____________________________________________   EKG  I, Nance Pear, attending physician, personally viewed and interpreted this EKG  EKG Time: 2223 Rate: 76 Rhythm: sinus rhythm Axis: left axis deviation Intervals: qtc 470 QRS: LAFB ST changes: no st elevation Impression: abnormal ekg   ____________________________________________    RADIOLOGY  CXR No acute  finding  ____________________________________________   PROCEDURES  Procedures  ____________________________________________   INITIAL IMPRESSION / ASSESSMENT AND PLAN / ED COURSE  Pertinent labs & imaging results that were available during my care of the patient were reviewed by me and considered in my medical decision making (see chart for details).  Patient presents to the emergency department today with myriad complaints. Physical exam does show some edema. Concern for UTI, CHF, kidney disease, pneumonia amongst other possible etiologies of the patient's varied symptoms. Will send broad blood work, urine and obtain a chest x-ray to evaluate for possible etiology.  _______________________________________   FINAL CLINICAL IMPRESSION(S) / ED DIAGNOSES  Urinary Incontinence Cough Peripheral edema  Note: This dictation was prepared with Dragon dictation. Any transcriptional errors that result from this process are unintentional     Nance Pear, MD 01/11/17 2329

## 2017-01-12 DIAGNOSIS — N390 Urinary tract infection, site not specified: Secondary | ICD-10-CM | POA: Diagnosis not present

## 2017-01-12 MED ORDER — SULFAMETHOXAZOLE-TRIMETHOPRIM 800-160 MG PO TABS
1.0000 | ORAL_TABLET | ORAL | Status: AC
  Administered 2017-01-12: 1 via ORAL
  Filled 2017-01-12: qty 1

## 2017-01-12 MED ORDER — SULFAMETHOXAZOLE-TRIMETHOPRIM 800-160 MG PO TABS: 1.0000 | ORAL_TABLET | Freq: Two times a day (BID) | ORAL | 0 refills | Status: AC

## 2017-01-12 NOTE — Discharge Instructions (Signed)
Your workup today suggests that you have a urinary tract infection (UTI).  Please take your antibiotic as prescribed and over-the-counter pain medication (Tylenol or Motrin) as needed, but no more than recommended on the label instructions.  Drink PLENTY of fluids.  Take all six tablets prescribed; do not stop taking the medication early.  Call your regular doctor to schedule the next available appointment to follow up on today?s ED visit, or return immediately to the ED if your pain worsens, you have decreased urine production, develop fever, persistent vomiting, or other symptoms that concern you.

## 2017-01-14 LAB — URINE CULTURE: Culture: >=100000 — AB

## 2017-02-06 ENCOUNTER — Other Ambulatory Visit: Payer: Self-pay

## 2017-02-06 ENCOUNTER — Inpatient Hospital Stay
Admission: EM | Admit: 2017-02-06 | Discharge: 2017-02-11 | DRG: 189 | Disposition: A | Discharge status: Skilled Nursing Facility | Payer: Medicare Other | Attending: Internal Medicine | Admitting: Internal Medicine

## 2017-02-06 ENCOUNTER — Emergency Department: Payer: Medicare Other

## 2017-02-06 ENCOUNTER — Encounter: Payer: Self-pay | Admitting: Emergency Medicine

## 2017-02-06 DIAGNOSIS — Z7982 Long term (current) use of aspirin: Secondary | ICD-10-CM

## 2017-02-06 DIAGNOSIS — E039 Hypothyroidism, unspecified: Secondary | ICD-10-CM | POA: Diagnosis present

## 2017-02-06 DIAGNOSIS — F419 Anxiety disorder, unspecified: Secondary | ICD-10-CM | POA: Diagnosis present

## 2017-02-06 DIAGNOSIS — E785 Hyperlipidemia, unspecified: Secondary | ICD-10-CM | POA: Diagnosis present

## 2017-02-06 DIAGNOSIS — I10 Essential (primary) hypertension: Secondary | ICD-10-CM | POA: Diagnosis present

## 2017-02-06 DIAGNOSIS — B9620 Unspecified Escherichia coli [E. coli] as the cause of diseases classified elsewhere: Secondary | ICD-10-CM | POA: Diagnosis present

## 2017-02-06 DIAGNOSIS — M109 Gout, unspecified: Secondary | ICD-10-CM | POA: Diagnosis present

## 2017-02-06 DIAGNOSIS — Z853 Personal history of malignant neoplasm of breast: Secondary | ICD-10-CM

## 2017-02-06 DIAGNOSIS — H5789 Other specified disorders of eye and adnexa: Secondary | ICD-10-CM | POA: Diagnosis present

## 2017-02-06 DIAGNOSIS — S60222A Contusion of left hand, initial encounter: Secondary | ICD-10-CM | POA: Diagnosis present

## 2017-02-06 DIAGNOSIS — W19XXXA Unspecified fall, initial encounter: Secondary | ICD-10-CM | POA: Diagnosis present

## 2017-02-06 DIAGNOSIS — N3000 Acute cystitis without hematuria: Secondary | ICD-10-CM | POA: Diagnosis present

## 2017-02-06 DIAGNOSIS — K5900 Constipation, unspecified: Secondary | ICD-10-CM | POA: Diagnosis present

## 2017-02-06 DIAGNOSIS — R0902 Hypoxemia: Secondary | ICD-10-CM | POA: Diagnosis not present

## 2017-02-06 DIAGNOSIS — F329 Major depressive disorder, single episode, unspecified: Secondary | ICD-10-CM | POA: Diagnosis present

## 2017-02-06 DIAGNOSIS — Z79899 Other long term (current) drug therapy: Secondary | ICD-10-CM

## 2017-02-06 DIAGNOSIS — J9811 Atelectasis: Secondary | ICD-10-CM | POA: Diagnosis present

## 2017-02-06 DIAGNOSIS — Z923 Personal history of irradiation: Secondary | ICD-10-CM

## 2017-02-06 DIAGNOSIS — J9601 Acute respiratory failure with hypoxia: Secondary | ICD-10-CM | POA: Diagnosis not present

## 2017-02-06 DIAGNOSIS — H04123 Dry eye syndrome of bilateral lacrimal glands: Secondary | ICD-10-CM | POA: Diagnosis present

## 2017-02-06 LAB — BASIC METABOLIC PANEL
ANION GAP: 7 (ref 5–15)
BUN: 20 mg/dL (ref 6–20)
CALCIUM: 9 mg/dL (ref 8.9–10.3)
CO2: 26 mmol/L (ref 22–32)
CREATININE: 0.99 mg/dL (ref 0.44–1.00)
Chloride: 105 mmol/L (ref 101–111)
GFR calc Af Amer: 56 mL/min — ABNORMAL LOW (ref >60)
GFR, EST NON AFRICAN AMERICAN: 49 mL/min — AB (ref >60)
GLUCOSE: 122 mg/dL — AB (ref 65–99)
Potassium: 3.9 mmol/L (ref 3.5–5.1)
Sodium: 138 mmol/L (ref 135–145)

## 2017-02-06 LAB — URINALYSIS, COMPLETE (UACMP) WITH MICROSCOPIC
BILIRUBIN URINE: NEGATIVE
GLUCOSE, UA: NEGATIVE mg/dL
HGB URINE DIPSTICK: NEGATIVE
KETONES UR: NEGATIVE mg/dL
Nitrite: POSITIVE — AB
PROTEIN: 30 mg/dL — AB
Specific Gravity, Urine: 1.017 (ref 1.005–1.030)
pH: 6 (ref 5.0–8.0)

## 2017-02-06 LAB — CBC WITH DIFFERENTIAL/PLATELET
BASOS ABS: 0.1 10*3/uL (ref 0–0.1)
Basophils Relative: 1 %
EOS ABS: 0.1 10*3/uL (ref 0–0.7)
Eosinophils Relative: 1 %
HCT: 42.1 % (ref 35.0–47.0)
HEMOGLOBIN: 13.3 g/dL (ref 12.0–16.0)
LYMPHS ABS: 0.9 10*3/uL — AB (ref 1.0–3.6)
LYMPHS PCT: 10 %
MCH: 27.2 pg (ref 26.0–34.0)
MCHC: 31.6 g/dL — ABNORMAL LOW (ref 32.0–36.0)
MCV: 86.1 fL (ref 80.0–100.0)
Monocytes Absolute: 0.7 10*3/uL (ref 0.2–0.9)
Monocytes Relative: 7 %
NEUTROS PCT: 81 %
Neutro Abs: 7.5 10*3/uL — ABNORMAL HIGH (ref 1.4–6.5)
Platelets: 276 10*3/uL (ref 150–440)
RBC: 4.89 MIL/uL (ref 3.80–5.20)
RDW: 16.5 % — ABNORMAL HIGH (ref 11.5–14.5)
WBC: 9.3 10*3/uL (ref 3.6–11.0)

## 2017-02-06 LAB — TROPONIN I

## 2017-02-06 MED ORDER — ONDANSETRON HCL 4 MG/2ML IJ SOLN: 4.0000 mg | Freq: Four times a day (QID) | INTRAMUSCULAR | Status: DC | PRN

## 2017-02-06 MED ORDER — METOPROLOL TARTRATE 50 MG PO TABS
50.0000 mg | ORAL_TABLET | Freq: Two times a day (BID) | ORAL | Status: DC
  Administered 2017-02-07 – 2017-02-11 (×10): 50 mg via ORAL
  Filled 2017-02-06 (×10): qty 1

## 2017-02-06 MED ORDER — DOCUSATE SODIUM 100 MG PO CAPS
100.0000 mg | ORAL_CAPSULE | Freq: Two times a day (BID) | ORAL | Status: DC
  Administered 2017-02-07 – 2017-02-11 (×10): 100 mg via ORAL
  Filled 2017-02-06 (×10): qty 1

## 2017-02-06 MED ORDER — AMLODIPINE BESYLATE 10 MG PO TABS
10.0000 mg | ORAL_TABLET | Freq: Every day | ORAL | Status: DC
  Administered 2017-02-07 – 2017-02-11 (×5): 10 mg via ORAL
  Filled 2017-02-06 (×5): qty 1

## 2017-02-06 MED ORDER — BUPROPION HCL ER (XL) 150 MG PO TB24
150.0000 mg | ORAL_TABLET | Freq: Every day | ORAL | Status: DC
  Administered 2017-02-07 – 2017-02-11 (×5): 150 mg via ORAL
  Filled 2017-02-06 (×5): qty 1

## 2017-02-06 MED ORDER — ASPIRIN EC 81 MG PO TBEC
81.0000 mg | DELAYED_RELEASE_TABLET | Freq: Every day | ORAL | Status: DC
  Administered 2017-02-07 – 2017-02-10 (×5): 81 mg via ORAL
  Filled 2017-02-06 (×5): qty 1

## 2017-02-06 MED ORDER — LEVOTHYROXINE SODIUM 100 MCG PO TABS
100.0000 ug | ORAL_TABLET | Freq: Every day | ORAL | Status: DC
  Administered 2017-02-07 – 2017-02-11 (×5): 100 ug via ORAL
  Filled 2017-02-06 (×5): qty 1

## 2017-02-06 MED ORDER — ACETAMINOPHEN 650 MG RE SUPP: 650.0000 mg | Freq: Four times a day (QID) | RECTAL | Status: DC | PRN

## 2017-02-06 MED ORDER — ONDANSETRON HCL 4 MG PO TABS: 4.0000 mg | ORAL_TABLET | Freq: Four times a day (QID) | ORAL | Status: DC | PRN

## 2017-02-06 MED ORDER — TRAZODONE HCL 50 MG PO TABS
25.0000 mg | ORAL_TABLET | Freq: Every evening | ORAL | Status: DC | PRN
  Filled 2017-02-06: qty 1

## 2017-02-06 MED ORDER — SIMVASTATIN 20 MG PO TABS
20.0000 mg | ORAL_TABLET | Freq: Every day | ORAL | Status: DC
  Administered 2017-02-07 – 2017-02-10 (×5): 20 mg via ORAL
  Filled 2017-02-06 (×5): qty 1

## 2017-02-06 MED ORDER — BISACODYL 5 MG PO TBEC: 5.0000 mg | DELAYED_RELEASE_TABLET | Freq: Every day | ORAL | Status: DC | PRN

## 2017-02-06 MED ORDER — ACETAMINOPHEN 325 MG PO TABS: 650.0000 mg | ORAL_TABLET | Freq: Four times a day (QID) | ORAL | Status: DC | PRN

## 2017-02-06 MED ORDER — VITAMIN D 1000 UNITS PO TABS
2000.0000 [IU] | ORAL_TABLET | Freq: Every day | ORAL | Status: DC
  Administered 2017-02-07 – 2017-02-11 (×5): 2000 [IU] via ORAL
  Filled 2017-02-06 (×5): qty 2

## 2017-02-06 MED ORDER — ESCITALOPRAM OXALATE 10 MG PO TABS
20.0000 mg | ORAL_TABLET | Freq: Every day | ORAL | Status: DC
  Administered 2017-02-07 – 2017-02-11 (×5): 20 mg via ORAL
  Filled 2017-02-06 (×5): qty 2

## 2017-02-06 MED ORDER — SODIUM CHLORIDE 0.9 % IV SOLN
INTRAVENOUS | Status: DC
  Administered 2017-02-07 (×2): via INTRAVENOUS

## 2017-02-06 MED ORDER — HYDRALAZINE HCL 50 MG PO TABS
25.0000 mg | ORAL_TABLET | Freq: Three times a day (TID) | ORAL | Status: DC
  Administered 2017-02-07 – 2017-02-11 (×14): 25 mg via ORAL
  Filled 2017-02-06 (×14): qty 1

## 2017-02-06 MED ORDER — HEPARIN SODIUM (PORCINE) 5000 UNIT/ML IJ SOLN
5000.0000 [IU] | Freq: Three times a day (TID) | INTRAMUSCULAR | Status: DC
  Administered 2017-02-07 – 2017-02-11 (×14): 5000 [IU] via SUBCUTANEOUS
  Filled 2017-02-06 (×14): qty 1

## 2017-02-06 NOTE — ED Notes (Signed)
Stephen RN aware of bed assigned  

## 2017-02-06 NOTE — ED Notes (Signed)
Patient transported to X-ray 

## 2017-02-06 NOTE — ED Notes (Signed)
Pt ambulated in room with walker while monitoring oxygen saturation.  Patient O2 sats dropped to 89% on room air.  Patient states exhausted after walking from the bed to the door.  Patient O2 sat quickly back up to 91% RA after sitting.  Patient placed on 2L [place] at this time.

## 2017-02-06 NOTE — H&P (Signed)
[place] [facility] [place] [name]    MR#:  [id number]  DATE OF BIRTH:  May 22, 1924  DATE OF ADMISSION:  02/06/2017  PRIMARY CARE PHYSICIAN: [name], MD   REQUESTING/REFERRING PHYSICIAN: Schuyler Amor, MD  CHIEF COMPLAINT:   Chief Complaint  Patient presents with  . Fall    HISTORY OF PRESENT ILLNESS:  Andrea Schaefer  is a 83 y.o. female with a known history of anxiety, hypertension, history of right breast cancer is being admitted for hypoxia status post non-syncopal fall today. she was putting on pants lost her balance and bumped her head.  Family states that she is at her baseline.  She does have a bruise to her left hand, at this time there is no evidence of other injury.  She states her hip may hurt a little bit.  Per family who is at the bedside it will be very difficult to get her back home this way.  There is no clinical social worker in the emergency department to set up oxygen for her at home.  He desaturated to 87% per emergency room doctor and placed on 2 L oxygen.  At some point she was on oxygen but has not been using per family as her oxygen saturations were doing well. PAST MEDICAL HISTORY:   Past Medical History:  Diagnosis Date  . Anxiety   . Cancer [facility]) 2011   Right  . Gout   . Hypertension   . Hypothyroidism   . Personal history of radiation therapy   . Thyroid disease     PAST SURGICAL HISTORY:   Past Surgical History:  Procedure Laterality Date  . BACK SURGERY    . BREAST BIOPSY Right 2011   +    SOCIAL HISTORY:   Social History   Tobacco Use  . Smoking status: Never Smoker  . Smokeless tobacco: Never Used  Substance Use Topics  . Alcohol use: No    FAMILY HISTORY:   Family History  Problem Relation Age of Onset  . Hypertension Other   . Breast cancer Neg Hx     DRUG ALLERGIES:   Allergies  Allergen Reactions  . Codeine Nausea And Vomiting  . Tetracyclines & Related Nausea  And Vomiting  . Adhesive [Tape] Rash  . Keflex [Cephalexin] Rash  . Naproxen Rash    REVIEW OF SYSTEMS:   Review of Systems  Constitutional: Positive for malaise/fatigue. Negative for chills, fever and weight loss.  HENT: Negative for nosebleeds and sore throat.   Eyes: Negative for blurred vision.  Respiratory: Negative for cough, shortness of breath and wheezing.   Cardiovascular: Negative for chest pain, orthopnea, leg swelling and PND.  Gastrointestinal: Negative for abdominal pain, constipation, diarrhea, heartburn, nausea and vomiting.  Genitourinary: Negative for dysuria and urgency.  Musculoskeletal: Positive for falls. Negative for back pain.  Skin: Negative for rash.  Neurological: Positive for weakness. Negative for dizziness, speech change, focal weakness and headaches.  Endo/Heme/Allergies: Does not bruise/bleed easily.  Psychiatric/Behavioral: Negative for depression.   MEDICATIONS AT HOME:   Prior to Admission medications   Medication Sig Start Date End Date Taking? Authorizing Provider  acetaminophen (TYLENOL) 325 MG tablet Take 2 tablets (650 mg total) by mouth every 6 (six) hours as needed for mild pain, moderate pain or headache (headache). 09/11/15  Yes Gladstone Lighter, MD  amLODipine (NORVASC) 10 MG tablet Take 1 tablet (10 mg total) by mouth daily. 09/18/14  Yes Demetrios Loll,  MD  aspirin EC 81 MG tablet Take 81 mg by mouth at bedtime.    Yes [provider]  buPROPion (WELLBUTRIN XL) 150 MG 24 hr tablet Take 150 mg by mouth daily.   Yes [provider]  cholecalciferol (VITAMIN D) 1000 units tablet Take 2,000 Units by mouth daily.   Yes [provider]  escitalopram (LEXAPRO) 20 MG tablet Take 20 mg by mouth daily.   Yes [provider]  hydrALAZINE (APRESOLINE) 25 MG tablet Take 1 tablet (25 mg total) by mouth 3 (three) times daily. 09/18/14  Yes Demetrios Loll, MD  levothyroxine (SYNTHROID, LEVOTHROID) 100 MCG tablet Take 100 mcg by  mouth daily before breakfast.   Yes [provider]  metoprolol (LOPRESSOR) 50 MG tablet Take 1 tablet (50 mg total) by mouth 2 (two) times daily. 09/18/14  Yes Demetrios Loll, MD  simvastatin (ZOCOR) 20 MG tablet Take 20 mg by mouth at bedtime.   Yes [provider]  levofloxacin (LEVAQUIN) 250 MG tablet Take 1 tablet (250 mg total) by mouth daily at 6 PM. X 4 more days Patient not taking: Reported on 02/06/2017 09/11/15   Gladstone Lighter, MD      VITAL SIGNS:  Blood pressure (!) 142/83, pulse 75, temperature 98 F (36.7 C), temperature source Oral, resp. rate 20, height 5\' 7"  (1.702 m), weight 95 kg (209 lb 7 oz), SpO2 (!) 89 %.  PHYSICAL EXAMINATION:  Physical Exam  GENERAL:  83 y.o.-year-old patient lying in the bed with no acute distress.  EYES: Pupils equal, round, reactive to light and accommodation. No scleral icterus. Extraocular muscles intact.  HEENT: Head atraumatic, normocephalic. Oropharynx and nasopharynx clear.  NECK:  Supple, no jugular venous distention. No thyroid enlargement, no tenderness.  LUNGS: Normal breath sounds bilaterally, no wheezing, rales,rhonchi or crepitation. No use of accessory muscles of respiration.  CARDIOVASCULAR: S1, S2 normal. No murmurs, rubs, or gallops.  ABDOMEN: Soft, nontender, nondistended. Bowel sounds present. No organomegaly or mass.  EXTREMITIES: No pedal edema, cyanosis, or clubbing.  NEUROLOGIC: Cranial nerves II through XII are intact. Muscle strength 5/5 in all extremities. Sensation intact. Gait not checked.  PSYCHIATRIC: The patient is alert and oriented x 3.  SKIN: No obvious rash, lesion, or ulcer.  LABORATORY PANEL:   CBC Recent Labs  Lab 02/06/17 2000  WBC 9.3  HGB 13.3  HCT 42.1  PLT 276   ------------------------------------------------------------------------------------------------------------------  Chemistries  Recent Labs  Lab 02/06/17 2000  NA 138  K 3.9  CL 105  CO2 26  GLUCOSE 122*    BUN 20  CREATININE 0.99  CALCIUM 9.0   ------------------------------------------------------------------------------------------------------------------  Cardiac Enzymes Recent Labs  Lab 02/06/17 2000  TROPONINI <0.03   ------------------------------------------------------------------------------------------------------------------  RADIOLOGY:  Ct Head Wo Contrast  Result Date: 02/06/2017 CLINICAL DATA:  Fall.  Posterior scalp hematoma. EXAM: CT HEAD WITHOUT CONTRAST CT CERVICAL SPINE WITHOUT CONTRAST TECHNIQUE: Multidetector CT imaging of the head and cervical spine was performed following the standard protocol without intravenous contrast. Multiplanar CT image reconstructions of the cervical spine were also generated. COMPARISON:  None. FINDINGS: CT HEAD FINDINGS Brain: No evidence of acute infarction, hemorrhage, hydrocephalus, extra-axial collection or mass lesion/mass effect. Cortical atrophy and mild periventricular small vessel disease present. Vascular: No hyperdense vessel or unexpected calcification. Skull: Normal. Negative for fracture or focal lesion. Sinuses/Orbits: No acute finding. Other: Focal scalp hemorrhage overlying the left posterior vertex. CT CERVICAL SPINE FINDINGS Alignment: Normal cervical spine alignment without evidence of subluxation. Skull base and  vertebrae: No fracture or bone lesions. Bones are osteopenic. Soft tissues and spinal canal: No soft tissue swelling, incidental masses or lymphadenopathy. The visualized airway is normally patent. Disc levels:  No significant degenerative disc disease identified. Upper chest: Negative. IMPRESSION: 1. Left posterior vertex scalp hematoma without evidence of intracranial hemorrhage or skull fracture. 2. No evidence of cervical spine injury. Electronically Signed   By: Aletta Edouard M.D.   On: 02/06/2017 18:25   Ct Cervical Spine Wo Contrast  Result Date: 02/06/2017 CLINICAL DATA:  Fall.  Posterior scalp hematoma.  EXAM: CT HEAD WITHOUT CONTRAST CT CERVICAL SPINE WITHOUT CONTRAST TECHNIQUE: Multidetector CT imaging of the head and cervical spine was performed following the standard protocol without intravenous contrast. Multiplanar CT image reconstructions of the cervical spine were also generated. COMPARISON:  None. FINDINGS: CT HEAD FINDINGS Brain: No evidence of acute infarction, hemorrhage, hydrocephalus, extra-axial collection or mass lesion/mass effect. Cortical atrophy and mild periventricular small vessel disease present. Vascular: No hyperdense vessel or unexpected calcification. Skull: Normal. Negative for fracture or focal lesion. Sinuses/Orbits: No acute finding. Other: Focal scalp hemorrhage overlying the left posterior vertex. CT CERVICAL SPINE FINDINGS Alignment: Normal cervical spine alignment without evidence of subluxation. Skull base and vertebrae: No fracture or bone lesions. Bones are osteopenic. Soft tissues and spinal canal: No soft tissue swelling, incidental masses or lymphadenopathy. The visualized airway is normally patent. Disc levels:  No significant degenerative disc disease identified. Upper chest: Negative. IMPRESSION: 1. Left posterior vertex scalp hematoma without evidence of intracranial hemorrhage or skull fracture. 2. No evidence of cervical spine injury. Electronically Signed   By: Aletta Edouard M.D.   On: 02/06/2017 18:25   Dg Chest Port 1 View  Result Date: 02/06/2017 CLINICAL DATA:  Fall EXAM: PORTABLE CHEST 1 VIEW COMPARISON:  01/11/2017 FINDINGS: Subsegmental atelectasis or scarring at the lung bases. No consolidation or effusion. Mild cardiomegaly with aortic atherosclerosis. No pneumothorax. Old right third rib fracture. IMPRESSION: 1. Subsegmental atelectasis or scarring at the bases. Negative for pneumothorax or pleural effusion 2. Stable cardiomegaly Electronically Signed   By: Donavan Foil M.D.   On: 02/06/2017 18:55   Dg Hand Complete Left  Result Date:  02/06/2017 CLINICAL DATA:  Fall, bruising to the left hand EXAM: LEFT HAND - COMPLETE 3+ VIEW COMPARISON:  None. FINDINGS: Surgical plate and screw fixation of the distal radius across old fracture deformity. Diffuse osteopenia. No acute displaced fracture or malalignment. Diffuse joint space narrowing at the DIP and PIP joints. Arthritis at the first [facility] and MCP joints. IMPRESSION: 1. No acute osseous abnormality 2. Osteopenia with diffuse degenerative changes. Electronically Signed   By: Donavan Foil M.D.   On: 02/06/2017 18:57   Dg Hip Unilat With Pelvis 2-3 Views Left  Result Date: 02/06/2017 CLINICAL DATA:  Fall today landing on left hip. EXAM: DG HIP (WITH OR WITHOUT PELVIS) 2-3V LEFT COMPARISON:  None. FINDINGS: Diffuse osteopenia. Mild symmetric degenerative change of the hips. No acute fracture or dislocation. There are degenerative changes of the spine. IMPRESSION: No acute findings. Electronically Signed   By: Marin Olp M.D.   On: 02/06/2017 18:08   IMPRESSION AND PLAN:  83 year old female with known history of anxiety, hypertension, history of right breast cancer is being admitted for hypoxia status post non-syncopal fall today  * hypoxia -Likely due to not taking deep breath from pain from fall -she does have atelectasis/scarring seen on chest x-ray which could be contributing as well -She may need 2 L oxygen  at discharge  *Hypertension -Continue hydralazine, amlodipine, metoprolol  *Anxiety -Continue Lexapro and Wellbutrin  *Hypothyroidism -Continue Synthroid  Get physical therapy evaluation.  Family is concerned for home situation and wanting to place her at higher level of care, if nothing at least [place] set up  All the records are reviewed and case discussed with ED provider. Management plans discussed with the patient, family (niece at bedside) and they are in agreement.  CODE STATUS: Full code  TOTAL TIME TAKING CARE OF THIS PATIENT: 40 minutes.     Max Sane M.D on 02/06/2017 at 10:43 PM  Between 7am to 6pm - Pager - [phone]  After 6pm go to [email or web address] - Proofreader  [employer] [facility] [place]  Office  [phone]  CC: Primary care physician; [name], MD   Note: This dictation was prepared with Dragon dictation along with smaller phrase technology. Any transcriptional errors that result from this process are unintentional.

## 2017-02-06 NOTE — ED Provider Notes (Addendum)
[facility] Emergency Department Provider Note  ____________________________________________   I have reviewed the triage vital signs and the nursing notes. Where available I have reviewed prior notes and, if possible and indicated, outside hospital notes.    HISTORY  Chief Complaint Fall    HPI Andrea Schaefer is a 83 y.o. female who states she is sometimes on oxygen, she had a non-syncopal fall today and she was putting on pants lost her balance and bumped her head.  Family states that she is at her baseline.  She does have a bruise to her left hand, at this time there is no evidence of other injury.  She states her hip may hurt a little bit.  She denies any chest pain shortness of breath nausea vomiting or any other antecedent or subsequent symptoms.   Past Medical History:  Diagnosis Date  . Anxiety   . Cancer [facility]) 2011   Right  . Gout   . Hypertension   . Hypothyroidism   . Personal history of radiation therapy   . Thyroid disease     Patient Active Problem List   Diagnosis Date Noted  . Pneumonia 09/08/2015  . Compression fracture of L3 lumbar vertebra ([place]) 09/12/2014  . Intractable back pain 09/12/2014    Past Surgical History:  Procedure Laterality Date  . BACK SURGERY    . BREAST BIOPSY Right 2011   +    Prior to Admission medications   Medication Sig Start Date End Date Taking? Authorizing Provider  acetaminophen (TYLENOL) 325 MG tablet Take 2 tablets (650 mg total) by mouth every 6 (six) hours as needed for mild pain, moderate pain or headache (headache). 09/11/15   Gladstone Lighter, MD  amLODipine (NORVASC) 10 MG tablet Take 1 tablet (10 mg total) by mouth daily. 09/18/14   Demetrios Loll, MD  aspirin EC 81 MG tablet Take 81 mg by mouth at bedtime.     [provider]  buPROPion (WELLBUTRIN XL) 150 MG 24 hr tablet Take 150 mg by mouth daily.    [provider]  cholecalciferol (VITAMIN D) 1000 units tablet Take 2,000  Units by mouth daily.    [provider]  escitalopram (LEXAPRO) 20 MG tablet Take 20 mg by mouth daily.    [provider]  hydrALAZINE (APRESOLINE) 25 MG tablet Take 1 tablet (25 mg total) by mouth 3 (three) times daily. 09/18/14   Demetrios Loll, MD  levofloxacin (LEVAQUIN) 250 MG tablet Take 1 tablet (250 mg total) by mouth daily at 6 PM. X 4 more days 09/11/15   Gladstone Lighter, MD  levothyroxine (SYNTHROID, LEVOTHROID) 100 MCG tablet Take 100 mcg by mouth daily before breakfast.    [provider]  metoprolol (LOPRESSOR) 50 MG tablet Take 1 tablet (50 mg total) by mouth 2 (two) times daily. 09/18/14   Demetrios Loll, MD  simvastatin (ZOCOR) 20 MG tablet Take 20 mg by mouth at bedtime.    [provider]    Allergies Codeine; Tetracyclines & related; Adhesive [tape]; Keflex [cephalexin]; and Naproxen  Family History  Problem Relation Age of Onset  . Hypertension Other   . Breast cancer Neg Hx     Social History Social History   Tobacco Use  . Smoking status: Never Smoker  . Smokeless tobacco: Never Used  Substance Use Topics  . Alcohol use: No  . Drug use: No    Review of Systems Constitutional: No fever/chills Eyes: No visual changes. ENT: No sore throat.  No stiff neck no neck pain Cardiovascular: Denies chest pain. Respiratory: Denies shortness of breath. Gastrointestinal:   no vomiting.  No diarrhea.  No constipation. Genitourinary: Negative for dysuria. Musculoskeletal: Negative lower extremity swelling Skin: Negative for rash. Neurological: Negative for severe headaches, focal weakness or numbness.   ____________________________________________   PHYSICAL EXAM:  VITAL SIGNS: ED Triage Vitals  Enc Vitals Group     BP 02/06/17 1734 (!) 142/67     Pulse Rate 02/06/17 1735 76     Resp 02/06/17 1738 18     Temp 02/06/17 1738 98 F (36.7 C)     Temp Source 02/06/17 1738 Oral     SpO2 02/06/17 1735 (!) 87 %     Weight 02/06/17 1738  209 lb 7 oz (95 kg)     Height 02/06/17 1738 5\' 7"  (1.702 m)     Head Circumference --      Peak Flow --      Pain Score 02/06/17 1737 3     Pain Loc --      Pain Edu? --      Excl. in [place]? --     Constitutional: Alert and oriented. Well appearing and in no acute distress. Eyes: Conjunctivae are normal Head: Bruising noted to the left posterior scalp HEENT: No congestion/rhinnorhea. Mucous membranes are moist.  Oropharynx non-erythematous Neck:   No midline tenderness with no meningismus, no masses, no stridor Cardiovascular: Normal rate, regular rhythm. Grossly normal heart sounds.  Good peripheral circulation. Respiratory: Normal respiratory effort.  No retractions. Lungs CTAB. Abdominal: Soft and nontender. No distention. No guarding no rebound Back:  There is no focal tenderness or step off.  there is no midline tenderness there are no lesions noted. there is no CVA tenderness Musculoskeletal: Bruising noted to the left hand but no evidence of fracture, there is also minimal tenderness to palpation of the left hip with no evidence of fracture no joint effusions, no DVT signs strong distal pulses no edema Neurologic:  Normal speech and language. No gross focal neurologic deficits are appreciated.  Skin:  Skin is warm, dry and intact. No rash noted. Psychiatric: Mood and affect are normal. Speech and behavior are normal.  ____________________________________________   LABS (all labs ordered are listed, but only abnormal results are displayed)  Labs Reviewed - No data to display  Pertinent labs  results that were available during my care of the patient were reviewed by me and considered in my medical decision making (see chart for details). ____________________________________________  EKG  I personally interpreted any EKGs ordered by me or triage Sinus rhythm, rate 78, no acute ST elevation or depression nonspecific ST changes, poor R wave progression LVH  noted ____________________________________________  RADIOLOGY  Pertinent labs & imaging results that were available during my care of the patient were reviewed by me and considered in my medical decision making (see chart for details). If possible, patient and/or family made aware of any abnormal findings.  Ct Head Wo Contrast  Result Date: 02/06/2017 CLINICAL DATA:  Fall.  Posterior scalp hematoma. EXAM: CT HEAD WITHOUT CONTRAST CT CERVICAL SPINE WITHOUT CONTRAST TECHNIQUE: Multidetector CT imaging of the head and cervical spine was performed following the standard protocol without intravenous contrast. Multiplanar CT image reconstructions of the cervical spine were also generated. COMPARISON:  None. FINDINGS: CT HEAD FINDINGS Brain: No evidence of acute infarction, hemorrhage, hydrocephalus, extra-axial collection or mass lesion/mass effect. Cortical atrophy and mild periventricular small vessel disease present. Vascular: No hyperdense vessel  or unexpected calcification. Skull: Normal. Negative for fracture or focal lesion. Sinuses/Orbits: No acute finding. Other: Focal scalp hemorrhage overlying the left posterior vertex. CT CERVICAL SPINE FINDINGS Alignment: Normal cervical spine alignment without evidence of subluxation. Skull base and vertebrae: No fracture or bone lesions. Bones are osteopenic. Soft tissues and spinal canal: No soft tissue swelling, incidental masses or lymphadenopathy. The visualized airway is normally patent. Disc levels:  No significant degenerative disc disease identified. Upper chest: Negative. IMPRESSION: 1. Left posterior vertex scalp hematoma without evidence of intracranial hemorrhage or skull fracture. 2. No evidence of cervical spine injury. Electronically Signed   By: Aletta Edouard M.D.   On: 02/06/2017 18:25   Ct Cervical Spine Wo Contrast  Result Date: 02/06/2017 CLINICAL DATA:  Fall.  Posterior scalp hematoma. EXAM: CT HEAD WITHOUT CONTRAST CT CERVICAL SPINE  WITHOUT CONTRAST TECHNIQUE: Multidetector CT imaging of the head and cervical spine was performed following the standard protocol without intravenous contrast. Multiplanar CT image reconstructions of the cervical spine were also generated. COMPARISON:  None. FINDINGS: CT HEAD FINDINGS Brain: No evidence of acute infarction, hemorrhage, hydrocephalus, extra-axial collection or mass lesion/mass effect. Cortical atrophy and mild periventricular small vessel disease present. Vascular: No hyperdense vessel or unexpected calcification. Skull: Normal. Negative for fracture or focal lesion. Sinuses/Orbits: No acute finding. Other: Focal scalp hemorrhage overlying the left posterior vertex. CT CERVICAL SPINE FINDINGS Alignment: Normal cervical spine alignment without evidence of subluxation. Skull base and vertebrae: No fracture or bone lesions. Bones are osteopenic. Soft tissues and spinal canal: No soft tissue swelling, incidental masses or lymphadenopathy. The visualized airway is normally patent. Disc levels:  No significant degenerative disc disease identified. Upper chest: Negative. IMPRESSION: 1. Left posterior vertex scalp hematoma without evidence of intracranial hemorrhage or skull fracture. 2. No evidence of cervical spine injury. Electronically Signed   By: Aletta Edouard M.D.   On: 02/06/2017 18:25   Dg Chest Port 1 View  Result Date: 02/06/2017 CLINICAL DATA:  Fall EXAM: PORTABLE CHEST 1 VIEW COMPARISON:  01/11/2017 FINDINGS: Subsegmental atelectasis or scarring at the lung bases. No consolidation or effusion. Mild cardiomegaly with aortic atherosclerosis. No pneumothorax. Old right third rib fracture. IMPRESSION: 1. Subsegmental atelectasis or scarring at the bases. Negative for pneumothorax or pleural effusion 2. Stable cardiomegaly Electronically Signed   By: Donavan Foil M.D.   On: 02/06/2017 18:55   Dg Hand Complete Left  Result Date: 02/06/2017 CLINICAL DATA:  Fall, bruising to the left hand  EXAM: LEFT HAND - COMPLETE 3+ VIEW COMPARISON:  None. FINDINGS: Surgical plate and screw fixation of the distal radius across old fracture deformity. Diffuse osteopenia. No acute displaced fracture or malalignment. Diffuse joint space narrowing at the DIP and PIP joints. Arthritis at the first [facility] and MCP joints. IMPRESSION: 1. No acute osseous abnormality 2. Osteopenia with diffuse degenerative changes. Electronically Signed   By: Donavan Foil M.D.   On: 02/06/2017 18:57   Dg Hip Unilat With Pelvis 2-3 Views Left  Result Date: 02/06/2017 CLINICAL DATA:  Fall today landing on left hip. EXAM: DG HIP (WITH OR WITHOUT PELVIS) 2-3V LEFT COMPARISON:  None. FINDINGS: Diffuse osteopenia. Mild symmetric degenerative change of the hips. No acute fracture or dislocation. There are degenerative changes of the spine. IMPRESSION: No acute findings. Electronically Signed   By: Marin Olp M.D.   On: 02/06/2017 18:08   ____________________________________________    PROCEDURES  Procedure(s) performed: None  Procedures  Critical Care performed: None  ____________________________________________   INITIAL  IMPRESSION / ASSESSMENT AND PLAN / ED COURSE  Pertinent labs & imaging results that were available during my care of the patient were reviewed by me and considered in my medical decision making (see chart for details).  Patient here with a non-syncopal fall, obvious bruise to the head but no evidence of head bleed on exam or CT, we did x-ray all the areas that seem to be uncomfortable for the patient and they are reassuring.  She does have bruising to her hand but no evidence of fracture, I did defer extensive blood work on this patient has is a clearly a non-syncopal fall.  Oxygen saturations are in the low 90s however she is supposed to be on oxygen generally speaking has been in the past and is asymptomatic with this.  We will ambulate her to make sure she does not significantly desaturate and we  will reassess    ____________________________________________   FINAL CLINICAL IMPRESSION(S) / ED DIAGNOSES  Final diagnoses:  Fall      This chart was dictated using voice recognition software.  Despite best efforts to proofread,  errors can occur which can change meaning.      Schuyler Amor, MD 02/06/17 1924    Schuyler Amor, MD 02/06/17 2207

## 2017-02-06 NOTE — ED Triage Notes (Signed)
Pt to ED via EMS from home c/o fall today.  Was changing pants and lost balance, hit posterior head with hematoma and bruise to left hand.  Denies LOC, remembers entire incident.

## 2017-02-07 LAB — CBC
HEMATOCRIT: 36.1 % (ref 35.0–47.0)
HEMOGLOBIN: 11.6 g/dL — AB (ref 12.0–16.0)
MCH: 27.7 pg (ref 26.0–34.0)
MCHC: 32 g/dL (ref 32.0–36.0)
MCV: 86.6 fL (ref 80.0–100.0)
Platelets: 242 10*3/uL (ref 150–440)
RBC: 4.17 MIL/uL (ref 3.80–5.20)
RDW: 16.5 % — AB (ref 11.5–14.5)
WBC: 6.5 10*3/uL (ref 3.6–11.0)

## 2017-02-07 LAB — BASIC METABOLIC PANEL
Anion gap: 5 (ref 5–15)
BUN: 18 mg/dL (ref 6–20)
CALCIUM: 8.5 mg/dL — AB (ref 8.9–10.3)
CO2: 27 mmol/L (ref 22–32)
Chloride: 107 mmol/L (ref 101–111)
Creatinine, Ser: 0.99 mg/dL (ref 0.44–1.00)
GFR calc Af Amer: 56 mL/min — ABNORMAL LOW (ref >60)
GFR, EST NON AFRICAN AMERICAN: 49 mL/min — AB (ref >60)
GLUCOSE: 96 mg/dL (ref 65–99)
POTASSIUM: 3.9 mmol/L (ref 3.5–5.1)
Sodium: 139 mmol/L (ref 135–145)

## 2017-02-07 MED ORDER — GUAIFENESIN-DM 100-10 MG/5ML PO SYRP
5.0000 mL | ORAL_SOLUTION | ORAL | Status: DC | PRN
  Administered 2017-02-07 – 2017-02-10 (×5): 5 mL via ORAL
  Filled 2017-02-07 (×7): qty 5

## 2017-02-07 MED ORDER — LEVOFLOXACIN IN D5W 250 MG/50ML IV SOLN
250.0000 mg | INTRAVENOUS | Status: DC
  Administered 2017-02-07 – 2017-02-10 (×4): 250 mg via INTRAVENOUS
  Filled 2017-02-07 (×4): qty 50

## 2017-02-07 NOTE — Progress Notes (Signed)
[facility] [place] [name]    MR#:  [id number]  DATE OF BIRTH:  [birth year]  SUBJECTIVE:   Pt. Here due to fall and noted to be hypoxic.  Denies any shortness of breath, cough, N/V or any other associated symptoms.  UA + for UTI.   REVIEW OF SYSTEMS:    Review of Systems  Constitutional: Negative for chills and fever.  HENT: Negative for congestion and tinnitus.   Eyes: Negative for blurred vision and double vision.  Respiratory: Negative for cough, shortness of breath and wheezing.   Cardiovascular: Negative for chest pain, orthopnea and PND.  Gastrointestinal: Negative for abdominal pain, diarrhea, nausea and vomiting.  Genitourinary: Negative for dysuria and hematuria.  Musculoskeletal: Positive for falls.  Neurological: Positive for weakness. Negative for dizziness, sensory change and focal weakness.  All other systems reviewed and are negative.   Nutrition: Heart Healthy Tolerating Diet: Yes Tolerating PT: Await Eval.   DRUG ALLERGIES:   Allergies  Allergen Reactions  . Codeine Nausea And Vomiting  . Tetracyclines & Related Nausea And Vomiting  . Adhesive [Tape] Rash  . Keflex [Cephalexin] Rash  . Naproxen Rash    VITALS:  Blood pressure (!) 148/62, pulse 74, temperature 98.2 F (36.8 C), temperature source Oral, resp. rate 16, height 5\' 3"  (1.6 m), weight 94.5 kg (208 lb 4.8 oz), SpO2 97 %.  PHYSICAL EXAMINATION:   Physical Exam  GENERAL:  83 y.o.-year-old patient lying in bed in no acute distress.  EYES: Pupils equal, round, reactive to light and accommodation. No scleral icterus. Extraocular muscles intact.  HEENT: Head atraumatic, normocephalic. Oropharynx and nasopharynx clear.  NECK:  Supple, no jugular venous distention. No thyroid enlargement, no tenderness.  LUNGS: Normal breath sounds bilaterally, no wheezing, rales, rhonchi. No use of accessory muscles of respiration.  CARDIOVASCULAR: S1, S2  normal. No murmurs, rubs, or gallops.  ABDOMEN: Soft, nontender, nondistended. Bowel sounds present. No organomegaly or mass.  EXTREMITIES: No cyanosis, clubbing or edema b/l.   Significant bruising on the left hand/arm.  NEUROLOGIC: Cranial nerves II through XII are intact. No focal Motor or sensory deficits b/l.  Globally weak.  PSYCHIATRIC: The patient is alert and oriented x 3.  SKIN: No obvious rash, lesion, or ulcer.    LABORATORY PANEL:   CBC Recent Labs  Lab 02/07/17 0418  WBC 6.5  HGB 11.6*  HCT 36.1  PLT 242   ------------------------------------------------------------------------------------------------------------------  Chemistries  Recent Labs  Lab 02/07/17 0418  NA 139  K 3.9  CL 107  CO2 27  GLUCOSE 96  BUN 18  CREATININE 0.99  CALCIUM 8.5*   ------------------------------------------------------------------------------------------------------------------  Cardiac Enzymes Recent Labs  Lab 02/06/17 2000  TROPONINI <0.03   ------------------------------------------------------------------------------------------------------------------  RADIOLOGY:  Ct Head Wo Contrast  Result Date: 02/06/2017 CLINICAL DATA:  Fall.  Posterior scalp hematoma. EXAM: CT HEAD WITHOUT CONTRAST CT CERVICAL SPINE WITHOUT CONTRAST TECHNIQUE: Multidetector CT imaging of the head and cervical spine was performed following the standard protocol without intravenous contrast. Multiplanar CT image reconstructions of the cervical spine were also generated. COMPARISON:  None. FINDINGS: CT HEAD FINDINGS Brain: No evidence of acute infarction, hemorrhage, hydrocephalus, extra-axial collection or mass lesion/mass effect. Cortical atrophy and mild periventricular small vessel disease present. Vascular: No hyperdense vessel or unexpected calcification. Skull: Normal. Negative for fracture or focal lesion. Sinuses/Orbits: No acute finding. Other: Focal scalp hemorrhage overlying the left  posterior vertex. CT CERVICAL SPINE FINDINGS Alignment: Normal cervical spine  alignment without evidence of subluxation. Skull base and vertebrae: No fracture or bone lesions. Bones are osteopenic. Soft tissues and spinal canal: No soft tissue swelling, incidental masses or lymphadenopathy. The visualized airway is normally patent. Disc levels:  No significant degenerative disc disease identified. Upper chest: Negative. IMPRESSION: 1. Left posterior vertex scalp hematoma without evidence of intracranial hemorrhage or skull fracture. 2. No evidence of cervical spine injury. Electronically Signed   By: Aletta Edouard M.D.   On: 02/06/2017 18:25   Ct Cervical Spine Wo Contrast  Result Date: 02/06/2017 CLINICAL DATA:  Fall.  Posterior scalp hematoma. EXAM: CT HEAD WITHOUT CONTRAST CT CERVICAL SPINE WITHOUT CONTRAST TECHNIQUE: Multidetector CT imaging of the head and cervical spine was performed following the standard protocol without intravenous contrast. Multiplanar CT image reconstructions of the cervical spine were also generated. COMPARISON:  None. FINDINGS: CT HEAD FINDINGS Brain: No evidence of acute infarction, hemorrhage, hydrocephalus, extra-axial collection or mass lesion/mass effect. Cortical atrophy and mild periventricular small vessel disease present. Vascular: No hyperdense vessel or unexpected calcification. Skull: Normal. Negative for fracture or focal lesion. Sinuses/Orbits: No acute finding. Other: Focal scalp hemorrhage overlying the left posterior vertex. CT CERVICAL SPINE FINDINGS Alignment: Normal cervical spine alignment without evidence of subluxation. Skull base and vertebrae: No fracture or bone lesions. Bones are osteopenic. Soft tissues and spinal canal: No soft tissue swelling, incidental masses or lymphadenopathy. The visualized airway is normally patent. Disc levels:  No significant degenerative disc disease identified. Upper chest: Negative. IMPRESSION: 1. Left posterior vertex  scalp hematoma without evidence of intracranial hemorrhage or skull fracture. 2. No evidence of cervical spine injury. Electronically Signed   By: Aletta Edouard M.D.   On: 02/06/2017 18:25   Dg Chest Port 1 View  Result Date: 02/06/2017 CLINICAL DATA:  Fall EXAM: PORTABLE CHEST 1 VIEW COMPARISON:  01/11/2017 FINDINGS: Subsegmental atelectasis or scarring at the lung bases. No consolidation or effusion. Mild cardiomegaly with aortic atherosclerosis. No pneumothorax. Old right third rib fracture. IMPRESSION: 1. Subsegmental atelectasis or scarring at the bases. Negative for pneumothorax or pleural effusion 2. Stable cardiomegaly Electronically Signed   By: Donavan Foil M.D.   On: 02/06/2017 18:55   Dg Hand Complete Left  Result Date: 02/06/2017 CLINICAL DATA:  Fall, bruising to the left hand EXAM: LEFT HAND - COMPLETE 3+ VIEW COMPARISON:  None. FINDINGS: Surgical plate and screw fixation of the distal radius across old fracture deformity. Diffuse osteopenia. No acute displaced fracture or malalignment. Diffuse joint space narrowing at the DIP and PIP joints. Arthritis at the first [facility] and MCP joints. IMPRESSION: 1. No acute osseous abnormality 2. Osteopenia with diffuse degenerative changes. Electronically Signed   By: Donavan Foil M.D.   On: 02/06/2017 18:57   Dg Hip Unilat With Pelvis 2-3 Views Left  Result Date: 02/06/2017 CLINICAL DATA:  Fall today landing on left hip. EXAM: DG HIP (WITH OR WITHOUT PELVIS) 2-3V LEFT COMPARISON:  None. FINDINGS: Diffuse osteopenia. Mild symmetric degenerative change of the hips. No acute fracture or dislocation. There are degenerative changes of the spine. IMPRESSION: No acute findings. Electronically Signed   By: Marin Olp M.D.   On: 02/06/2017 18:08     ASSESSMENT AND PLAN:   83 yo female w/ hx of HTN, Hypothyroidism, hx of Gout, Anxiety, Depression, Hyperlipidemia, who presented to the hospital after a fall and noted to be hypoxic.    1. Acute  Hypoxia - etiology unclear. Likely related to atelectasis.  - CXR (-) for pneumonia  yesterday.  Cont. Incentive spirometry - may need to O2 at home and will assess today.   2. UTI - based on UA yesterday.  - will place on IV Levaquin and follow urine culture.   3. Essential hypertension-continue Norvasc, hydralazine, metoprolol.  4. Status post recent fall/weakness-await physical therapy evaluation to assess mobility. May need home health services upon discharge.  5. Hypothyroidism-continue Synthroid.  6. Hyperlipidemia-continue simvastatin.     All the records are reviewed and case discussed with Care Management/Social Worker. Management plans discussed with the patient, family and they are in agreement.  CODE STATUS: Full code  DVT Prophylaxis: Hep SQ  TOTAL TIME TAKING CARE OF THIS PATIENT: 30 minutes.   POSSIBLE D/C IN 1-2 DAYS, DEPENDING ON CLINICAL CONDITION.   Henreitta Leber M.D on 02/07/2017 at 10:52 AM  Between 7am to 6pm - Pager - [phone]  After 6pm go to [email or web address] - Proofreader  [facility]  Office  [phone]  CC: Primary care physician; [name], MD

## 2017-02-08 ENCOUNTER — Inpatient Hospital Stay: Payer: Medicare Other

## 2017-02-08 DIAGNOSIS — S60222A Contusion of left hand, initial encounter: Secondary | ICD-10-CM | POA: Diagnosis present

## 2017-02-08 DIAGNOSIS — E785 Hyperlipidemia, unspecified: Secondary | ICD-10-CM | POA: Diagnosis present

## 2017-02-08 DIAGNOSIS — F329 Major depressive disorder, single episode, unspecified: Secondary | ICD-10-CM | POA: Diagnosis present

## 2017-02-08 DIAGNOSIS — H04123 Dry eye syndrome of bilateral lacrimal glands: Secondary | ICD-10-CM | POA: Diagnosis present

## 2017-02-08 DIAGNOSIS — Z7982 Long term (current) use of aspirin: Secondary | ICD-10-CM | POA: Diagnosis not present

## 2017-02-08 DIAGNOSIS — H5789 Other specified disorders of eye and adnexa: Secondary | ICD-10-CM | POA: Diagnosis present

## 2017-02-08 DIAGNOSIS — E039 Hypothyroidism, unspecified: Secondary | ICD-10-CM | POA: Diagnosis present

## 2017-02-08 DIAGNOSIS — K5900 Constipation, unspecified: Secondary | ICD-10-CM | POA: Diagnosis present

## 2017-02-08 DIAGNOSIS — J9811 Atelectasis: Secondary | ICD-10-CM | POA: Diagnosis present

## 2017-02-08 DIAGNOSIS — Z853 Personal history of malignant neoplasm of breast: Secondary | ICD-10-CM | POA: Diagnosis not present

## 2017-02-08 DIAGNOSIS — M109 Gout, unspecified: Secondary | ICD-10-CM | POA: Diagnosis present

## 2017-02-08 DIAGNOSIS — Z79899 Other long term (current) drug therapy: Secondary | ICD-10-CM | POA: Diagnosis not present

## 2017-02-08 DIAGNOSIS — F419 Anxiety disorder, unspecified: Secondary | ICD-10-CM | POA: Diagnosis present

## 2017-02-08 DIAGNOSIS — R0902 Hypoxemia: Secondary | ICD-10-CM | POA: Diagnosis present

## 2017-02-08 DIAGNOSIS — Z923 Personal history of irradiation: Secondary | ICD-10-CM | POA: Diagnosis not present

## 2017-02-08 DIAGNOSIS — B9620 Unspecified Escherichia coli [E. coli] as the cause of diseases classified elsewhere: Secondary | ICD-10-CM | POA: Diagnosis present

## 2017-02-08 DIAGNOSIS — W19XXXA Unspecified fall, initial encounter: Secondary | ICD-10-CM | POA: Diagnosis present

## 2017-02-08 DIAGNOSIS — N3000 Acute cystitis without hematuria: Secondary | ICD-10-CM | POA: Diagnosis present

## 2017-02-08 DIAGNOSIS — I10 Essential (primary) hypertension: Secondary | ICD-10-CM | POA: Diagnosis present

## 2017-02-08 DIAGNOSIS — J9601 Acute respiratory failure with hypoxia: Secondary | ICD-10-CM | POA: Diagnosis present

## 2017-02-08 MED ORDER — IPRATROPIUM-ALBUTEROL 0.5-2.5 (3) MG/3ML IN SOLN
3.0000 mL | Freq: Four times a day (QID) | RESPIRATORY_TRACT | Status: DC
  Administered 2017-02-08 (×2): 3 mL via RESPIRATORY_TRACT
  Filled 2017-02-08 (×2): qty 3

## 2017-02-08 MED ORDER — IOPAMIDOL (ISOVUE-370) INJECTION 76%
75.0000 mL | Freq: Once | INTRAVENOUS | Status: AC | PRN
  Administered 2017-02-08: 19:00:00 75 mL via INTRAVENOUS

## 2017-02-08 MED ORDER — BUDESONIDE 0.5 MG/2ML IN SUSP
0.5000 mg | Freq: Two times a day (BID) | RESPIRATORY_TRACT | Status: DC
  Administered 2017-02-08 – 2017-02-11 (×7): 0.5 mg via RESPIRATORY_TRACT
  Filled 2017-02-08 (×7): qty 2

## 2017-02-08 MED ORDER — METHYLPREDNISOLONE SODIUM SUCC 40 MG IJ SOLR
40.0000 mg | Freq: Every day | INTRAMUSCULAR | Status: DC
  Administered 2017-02-08 – 2017-02-11 (×4): 40 mg via INTRAVENOUS
  Filled 2017-02-08 (×4): qty 1

## 2017-02-08 NOTE — [place] FL2 (Signed)
[place] LEVEL OF CARE SCREENING TOOL     IDENTIFICATION  Patient Name: BREONIA KIRSTEIN Birthdate: [birth year] Sex: female Admission Date (Current Location): 02/06/2017  [place] and [place] Number:  Engineering geologist and Address:  [facility], [address], [place], [place] [address]      Provider Number: [id number]  Attending Physician Name and Address:  Loletha Grayer, MD  Relative Name and Phone Number:       Current Level of Care: Hospital Recommended Level of Care: [place] Prior Approval Number:    Date Approved/Denied:   PASRR Number: ([id number] (PASARR can be looked up using the D.O.B 04/22/1924))  Discharge Plan: SNF    Current Diagnoses: Patient Active Problem List   Diagnosis Date Noted  . Hypoxia 02/06/2017  . Pneumonia 09/08/2015  . Compression fracture of L3 lumbar vertebra ([place]) 09/12/2014  . Intractable back pain 09/12/2014    Orientation RESPIRATION BLADDER Height & Weight     Self, Place  O2(2 Liters Oxygen. ) Incontinent Weight: 206 lb 4.8 oz (93.6 kg) Height:  5\' 3"  (160 cm)  BEHAVIORAL SYMPTOMS/MOOD NEUROLOGICAL BOWEL NUTRITION STATUS      Continent Diet(Diet: Heart Healthy )  AMBULATORY STATUS COMMUNICATION OF NEEDS Skin   Extensive Assist Verbally Normal                       Personal Care Assistance Level of Assistance  Bathing, Feeding, Dressing Bathing Assistance: Limited assistance Feeding assistance: Independent Dressing Assistance: Limited assistance     Functional Limitations Info  Sight, Hearing, Speech Sight Info: Adequate Hearing Info: Impaired Speech Info: Adequate    SPECIAL CARE FACTORS FREQUENCY  PT (By licensed PT), OT (By licensed OT)     PT Frequency: (5) OT Frequency: (5)            Contractures      Additional Factors Info  Code Status, Allergies Code Status Info: (Full Code. ) Allergies Info: (Codeine, Tetracyclines & Related,  Adhesive Tape, Keflex Cephalexin, Naproxen)           Current Medications (02/08/2017):  This is the current hospital active medication list Current Facility-Administered Medications  Medication Dose Route Frequency Provider Last Rate Last Dose  . acetaminophen (TYLENOL) tablet 650 mg  650 mg Oral Q6H PRN Max Sane, MD       Or  . acetaminophen (TYLENOL) suppository 650 mg  650 mg Rectal Q6H PRN Max Sane, MD      . amLODipine (NORVASC) tablet 10 mg  10 mg Oral Daily Max Sane, MD   10 mg at 02/08/17 0818  . aspirin EC tablet 81 mg  81 mg Oral QHS Max Sane, MD   81 mg at 02/07/17 2105  . bisacodyl (DULCOLAX) EC tablet 5 mg  5 mg Oral Daily PRN Max Sane, MD      . budesonide (PULMICORT) nebulizer solution 0.5 mg  0.5 mg Nebulization BID Loletha Grayer, MD   0.5 mg at 02/08/17 1315  . buPROPion (WELLBUTRIN XL) 24 hr tablet 150 mg  150 mg Oral Daily Max Sane, MD   150 mg at 02/08/17 0817  . cholecalciferol (VITAMIN D) tablet 2,000 Units  2,000 Units Oral Daily Max Sane, MD   2,000 Units at 02/08/17 0817  . docusate sodium (COLACE) capsule 100 mg  100 mg Oral BID Max Sane, MD   100 mg at 02/08/17 0817  . escitalopram (LEXAPRO) tablet 20 mg  20  mg Oral Daily Max Sane, MD   20 mg at 02/08/17 0817  . guaiFENesin-dextromethorphan (ROBITUSSIN DM) 100-10 MG/5ML syrup 5 mL  5 mL Oral Q4H PRN Henreitta Leber, MD   5 mL at 02/07/17 1544  . heparin injection 5,000 Units  5,000 Units Subcutaneous Q8H Max Sane, MD   5,000 Units at 02/08/17 0556  . hydrALAZINE (APRESOLINE) tablet 25 mg  25 mg Oral TID Max Sane, MD   25 mg at 02/08/17 0817  . ipratropium-albuterol (DUONEB) 0.5-2.5 (3) MG/3ML nebulizer solution 3 mL  3 mL Nebulization Q6H Wieting, Richard, MD   3 mL at 02/08/17 1314  . Levofloxacin (LEVAQUIN) IVPB 250 mg  250 mg Intravenous Q24H Henreitta Leber, MD   Stopped at 02/08/17 [phone]  . levothyroxine (SYNTHROID, LEVOTHROID) tablet 100 mcg  100 mcg Oral QAC breakfast  Max Sane, MD   100 mcg at 02/08/17 0817  . methylPREDNISolone sodium succinate (SOLU-MEDROL) 40 mg/mL injection 40 mg  40 mg Intravenous Daily Loletha Grayer, MD   40 mg at 02/08/17 1243  . metoprolol tartrate (LOPRESSOR) tablet 50 mg  50 mg Oral BID Max Sane, MD   50 mg at 02/08/17 0818  . ondansetron (ZOFRAN) tablet 4 mg  4 mg Oral Q6H PRN Max Sane, MD       Or  . ondansetron (ZOFRAN) injection 4 mg  4 mg Intravenous Q6H PRN Max Sane, MD      . simvastatin (ZOCOR) tablet 20 mg  20 mg Oral QHS Max Sane, MD   20 mg at 02/07/17 2105  . traZODone (DESYREL) tablet 25 mg  25 mg Oral QHS PRN Max Sane, MD         Discharge Medications: Please see discharge summary for a list of discharge medications.  Relevant Imaging Results:  Relevant Lab Results:   Additional Information (SSN: [id number])  Emmily Pellegrin, [name], LCSW

## 2017-02-08 NOTE — Clinical Social Work Note (Signed)
Clinical Social Work Assessment  Patient Details  [name] MRN: [id number] Date of Birth: 1924-07-06  Date of referral:  02/08/17               Reason for consult:  Facility Placement                Permission sought to share information with:  [name] granted to share information::  Yes, Verbal Permission Granted  Name::      Andrea Schaefer::   Andrea Schaefer  Relationship::     Contact Information:     Housing/Transportation Living arrangements for the past 2 months:  Andrea Schaefer of Information:  Patient, Other (Comment Required)(Niece Andrea Schaefer. ) Patient Interpreter Needed:  None Criminal Activity/Legal Involvement Pertinent to Current Situation/Hospitalization:  No - Comment as needed Significant Relationships:  Adult Children, Other Family Members Lives with:  Adult Children Do you feel safe going back to the place where you live?  Yes Need for family participation in patient care:  Yes (Comment)  Care giving concerns:  Patient lives with her disabled son Andrea Schaefer in [place].    Social Worker assessment / plan:  Holiday representative (CSW) received SNF consult. PT is recommending SNF. CSW met with patient and her niece Andrea Schaefer was at bedside. Patient was oriented to self and place and was sitting up in the chair at bedside. Per niece Andrea Schaefer [phone] patient and her son Andrea Schaefer take care of each other and the other son Andrea Schaefer is HPOA and lives in [place]. Per Andrea Schaefer she helps patient make decisions. CSW explained SNF process and that medicare requires a 3 night qualifying inpatient stay in a hospital in order to pay for SNF. Patient was admitted to inpatient on 02/08/17. Andrea Schaefer verbalized her understanding and is agreeable to SNF search in [place]. Andrea Schaefer prefers [facility] and stated that patient has been to [facility] in the past. FL2 complete and faxed out.   Per [facility] admissions coordinator at  [facility] they can accept patient. Andrea Schaefer accepted bed offer. Plan is for patient to D/C to [facility] Saturday 02/11/17 pending medical clearance. CSW will continue to follow and assist as needed.    Employment status:  Disabled (Comment on whether or not currently receiving Disability) Insurance information:  Medicare PT Recommendations:  [place] / Referral to community resources:  [place]  Patient/Family's Response to care:  Patient's niece Andrea Schaefer is agreeable for patient to go to [facility].   Patient/Family's Understanding of and Emotional Response to Diagnosis, Current Treatment, and Prognosis:  Patent's niece was very pleasant and thanked CSW for assistance.   Emotional Assessment Appearance:  Appears stated age Attitude/Demeanor/Rapport:    Affect (typically observed):  Pleasant Orientation:  Oriented to Self, Oriented to Place, Fluctuating Orientation (Suspected and/or reported Sundowners) Alcohol / Substance use:  Not Applicable Psych involvement (Current and /or in the community):  No (Comment)  Discharge Needs  Concerns to be addressed:  Discharge Planning Concerns Readmission within the last 30 days:  No Current discharge risk:  Dependent with Mobility Barriers to Discharge:  Continued Medical Work up   [name], Andrea Beets, LCSW 02/08/2017, 10:11 PM

## 2017-02-08 NOTE — Clinical Social Work Placement (Signed)
   CLINICAL SOCIAL WORK PLACEMENT  NOTE  Date:  02/08/2017  Patient Details  [name] MRN: [id number] Date of Birth: Mar 12, 1924  Clinical Social Work is seeking post-discharge placement for this patient at the [place] level of care (*CSW will initial, date and re-position this form in  chart as items are completed):  Yes   Patient/family provided with [place] Work Department's list of facilities offering this level of care within the geographic area requested by the patient (or if unable, by the patient's family).  Yes   Patient/family informed of their freedom to choose among providers that offer the needed level of care, that participate in Medicare, Medicaid or managed care program needed by the patient, have an available bed and are willing to accept the patient.  Yes   Patient/family informed of [facility]'s ownership interest in [facility] and [facility], as well as of the fact that they are under no obligation to receive care at these facilities.  PASRR submitted to EDS on       PASRR number received on       Existing PASRR number confirmed on 02/08/17     FL2 transmitted to all facilities in geographic area requested by pt/family on 02/08/17     FL2 transmitted to all facilities within larger geographic area on       Patient informed that his/her managed care company has contracts with or will negotiate with certain facilities, including the following:        Yes   Patient/family informed of bed offers received.  Patient chooses bed at [facility]. )     Physician recommends and patient chooses bed at      Patient to be transferred to   on  .  Patient to be transferred to facility by       Patient family notified on   of transfer.  Name of family member notified:        PHYSICIAN       Additional Comment:    _______________________________________________ Berdene Askari, [name], LCSW 02/08/2017, 10:09  PM

## 2017-02-08 NOTE — Progress Notes (Signed)
Patient ID: Andrea Schaefer, female   DOB: 20-Aug-1924, 83 y.o.   MRN: [id number]  [employer] PROGRESS NOTE  Andrea Schaefer YQI:[id number] DOB: 1925/02/03 DOA: 02/06/2017 PCP: [name], MD  HPI/Subjective: Patient with hypoxia.  Patient feels okay.  Does not really complain of shortness of breath.  Occasional cough.  Objective: Vitals:   02/08/17 0346 02/08/17 1312  BP: 140/64 (!) 111/51  Pulse: 72 69  Resp: 16   Temp: 98.9 F (37.2 C) 98 F (36.7 C)  SpO2: 94% 96%    Filed Weights   02/06/17 2336 02/07/17 0408 02/08/17 0346  Weight: 92.9 kg (204 lb 11.2 oz) 94.5 kg (208 lb 4.8 oz) 93.6 kg (206 lb 4.8 oz)    ROS: Review of Systems  Constitutional: Negative for chills and fever.  Eyes: Negative for blurred vision.  Respiratory: Negative for cough and shortness of breath.   Cardiovascular: Negative for chest pain.  Gastrointestinal: Negative for abdominal pain, constipation, diarrhea, nausea and vomiting.  Genitourinary: Negative for dysuria.  Musculoskeletal: Negative for joint pain.  Neurological: Negative for dizziness and headaches.   Exam: Physical Exam  Constitutional: She is oriented to person, place, and time.  HENT:  Nose: No mucosal edema.  Mouth/Throat: No oropharyngeal exudate or posterior oropharyngeal edema.  Eyes: Conjunctivae, EOM and lids are normal. Pupils are equal, round, and reactive to light.  Neck: No JVD present. Carotid bruit is not present. No edema present. No thyroid mass and no thyromegaly present.  Cardiovascular: S1 normal and S2 normal. Exam reveals no gallop.  No murmur heard. Pulses:      Dorsalis pedis pulses are 2+ on the right side, and 2+ on the left side.  Respiratory: No respiratory distress. She has decreased breath sounds in the right lower field and the left lower field. She has no wheezes. She has rhonchi in the left lower field. She has no rales.  GI: Soft. Bowel sounds are normal. There is no tenderness.   Musculoskeletal:       Right ankle: She exhibits swelling.       Left ankle: She exhibits swelling.  Lymphadenopathy:    She has no cervical adenopathy.  Neurological: She is alert and oriented to person, place, and time. No cranial nerve deficit.  Skin: Skin is warm. Nails show no clubbing.  Chronic lower extremity discoloration  Psychiatric: She has a normal mood and affect.      Data Reviewed: Basic Metabolic Panel: Recent Labs  Lab 02/06/17 2000 02/07/17 0418  NA 138 139  K 3.9 3.9  CL 105 107  CO2 26 27  GLUCOSE 122* 96  BUN 20 18  CREATININE 0.99 0.99  CALCIUM 9.0 8.5*   CBC: Recent Labs  Lab 02/06/17 2000 02/07/17 0418  WBC 9.3 6.5  NEUTROABS 7.5*  --   HGB 13.3 11.6*  HCT 42.1 36.1  MCV 86.1 86.6  PLT 276 242   Cardiac Enzymes: Recent Labs  Lab 02/06/17 2000  TROPONINI <0.03   BNP (last 3 results) Recent Labs    01/11/17 2237  BNP 229.0*      Studies: Ct Head Wo Contrast  Result Date: 02/06/2017 CLINICAL DATA:  Fall.  Posterior scalp hematoma. EXAM: CT HEAD WITHOUT CONTRAST CT CERVICAL SPINE WITHOUT CONTRAST TECHNIQUE: Multidetector CT imaging of the head and cervical spine was performed following the standard protocol without intravenous contrast. Multiplanar CT image reconstructions of the cervical spine were also generated. COMPARISON:  None. FINDINGS: CT HEAD FINDINGS Brain: No  evidence of acute infarction, hemorrhage, hydrocephalus, extra-axial collection or mass lesion/mass effect. Cortical atrophy and mild periventricular small vessel disease present. Vascular: No hyperdense vessel or unexpected calcification. Skull: Normal. Negative for fracture or focal lesion. Sinuses/Orbits: No acute finding. Other: Focal scalp hemorrhage overlying the left posterior vertex. CT CERVICAL SPINE FINDINGS Alignment: Normal cervical spine alignment without evidence of subluxation. Skull base and vertebrae: No fracture or bone lesions. Bones are osteopenic.  Soft tissues and spinal canal: No soft tissue swelling, incidental masses or lymphadenopathy. The visualized airway is normally patent. Disc levels:  No significant degenerative disc disease identified. Upper chest: Negative. IMPRESSION: 1. Left posterior vertex scalp hematoma without evidence of intracranial hemorrhage or skull fracture. 2. No evidence of cervical spine injury. Electronically Signed   By: Aletta Edouard M.D.   On: 02/06/2017 18:25   Ct Cervical Spine Wo Contrast  Result Date: 02/06/2017 CLINICAL DATA:  Fall.  Posterior scalp hematoma. EXAM: CT HEAD WITHOUT CONTRAST CT CERVICAL SPINE WITHOUT CONTRAST TECHNIQUE: Multidetector CT imaging of the head and cervical spine was performed following the standard protocol without intravenous contrast. Multiplanar CT image reconstructions of the cervical spine were also generated. COMPARISON:  None. FINDINGS: CT HEAD FINDINGS Brain: No evidence of acute infarction, hemorrhage, hydrocephalus, extra-axial collection or mass lesion/mass effect. Cortical atrophy and mild periventricular small vessel disease present. Vascular: No hyperdense vessel or unexpected calcification. Skull: Normal. Negative for fracture or focal lesion. Sinuses/Orbits: No acute finding. Other: Focal scalp hemorrhage overlying the left posterior vertex. CT CERVICAL SPINE FINDINGS Alignment: Normal cervical spine alignment without evidence of subluxation. Skull base and vertebrae: No fracture or bone lesions. Bones are osteopenic. Soft tissues and spinal canal: No soft tissue swelling, incidental masses or lymphadenopathy. The visualized airway is normally patent. Disc levels:  No significant degenerative disc disease identified. Upper chest: Negative. IMPRESSION: 1. Left posterior vertex scalp hematoma without evidence of intracranial hemorrhage or skull fracture. 2. No evidence of cervical spine injury. Electronically Signed   By: Aletta Edouard M.D.   On: 02/06/2017 18:25   Dg  Chest Port 1 View  Result Date: 02/06/2017 CLINICAL DATA:  Fall EXAM: PORTABLE CHEST 1 VIEW COMPARISON:  01/11/2017 FINDINGS: Subsegmental atelectasis or scarring at the lung bases. No consolidation or effusion. Mild cardiomegaly with aortic atherosclerosis. No pneumothorax. Old right third rib fracture. IMPRESSION: 1. Subsegmental atelectasis or scarring at the bases. Negative for pneumothorax or pleural effusion 2. Stable cardiomegaly Electronically Signed   By: Donavan Foil M.D.   On: 02/06/2017 18:55   Dg Hand Complete Left  Result Date: 02/06/2017 CLINICAL DATA:  Fall, bruising to the left hand EXAM: LEFT HAND - COMPLETE 3+ VIEW COMPARISON:  None. FINDINGS: Surgical plate and screw fixation of the distal radius across old fracture deformity. Diffuse osteopenia. No acute displaced fracture or malalignment. Diffuse joint space narrowing at the DIP and PIP joints. Arthritis at the first [facility] and MCP joints. IMPRESSION: 1. No acute osseous abnormality 2. Osteopenia with diffuse degenerative changes. Electronically Signed   By: Donavan Foil M.D.   On: 02/06/2017 18:57   Dg Hip Unilat With Pelvis 2-3 Views Left  Result Date: 02/06/2017 CLINICAL DATA:  Fall today landing on left hip. EXAM: DG HIP (WITH OR WITHOUT PELVIS) 2-3V LEFT COMPARISON:  None. FINDINGS: Diffuse osteopenia. Mild symmetric degenerative change of the hips. No acute fracture or dislocation. There are degenerative changes of the spine. IMPRESSION: No acute findings. Electronically Signed   By: Marin Olp M.D.   On:  02/06/2017 18:08    Scheduled Meds: . amLODipine  10 mg Oral Daily  . aspirin EC  81 mg Oral QHS  . budesonide (PULMICORT) nebulizer solution  0.5 mg Nebulization BID  . buPROPion  150 mg Oral Daily  . cholecalciferol  2,000 Units Oral Daily  . docusate sodium  100 mg Oral BID  . escitalopram  20 mg Oral Daily  . heparin  5,000 Units Subcutaneous Q8H  . hydrALAZINE  25 mg Oral TID  . ipratropium-albuterol  3  mL Nebulization Q6H  . levothyroxine  100 mcg Oral QAC breakfast  . methylPREDNISolone (SOLU-MEDROL) injection  40 mg Intravenous Daily  . metoprolol tartrate  50 mg Oral BID  . simvastatin  20 mg Oral QHS   Continuous Infusions: . levofloxacin (LEVAQUIN) IV Stopped (02/08/17 8315)    Assessment/Plan:  1. Acute hypoxic respiratory failure.  Chest x-ray negative.  Obtain a CT scan for further evaluation of the lungs.  Start nebulizer treatments. 2. Acute cystitis on Levaquin.  Follow-up urine culture. 3. Essential hypertension on Norvasc, hydralazine and metoprolol 4. Weakness.  Physical therapy recommends rehab 5. Hypothyroidism unspecified on levothyroxine 6. Hyperlipidemia unspecified on simvastatin  Code Status:     Code Status Orders  (From admission, onward)        Start     Ordered   02/06/17 2333  Full code  Continuous     02/06/17 2332    Code Status History    Date Active Date Inactive Code Status Order ID Comments User Context   09/08/2015 21:01 09/11/2015 12:43 Full Code [id number]  Max Sane, MD Inpatient   09/12/2014 20:25 09/18/2014 16:09 Full Code [id number]  Hower, Aaron Mose, MD ED    Advance Directive Documentation     Most Recent Value  Type of Advance Directive  Healthcare Power of Attorney  Pre-existing out of facility DNR order (yellow form or pink MOST form)  No data  "MOST" Form in Place?  No data     Family Communication: Spoke with son on phone. Disposition Plan: Patient will need a 3 night stay prior to going to rehab  Antibiotics:  Levaquin  Time spent: 28 minutes  [place]

## 2017-02-08 NOTE — Evaluation (Signed)
Physical Therapy Evaluation Patient Details [name] MRN: [id number] DOB: 1924-09-16 Today's Date: 02/08/2017   History of Present Illness  Pt is a 83 y/o M who presented with hypoxia s/p non-syncopal fall when putting on her pants.  UA positive for UTI.  Pt placed on 2L O2.  Pt's PMH includes cancer, gout, back surgery.      Clinical Impression  Pt admitted with above diagnosis. Pt currently with functional limitations due to the deficits listed below (see PT Problem List). Andrea Schaefer presents with impaired memory and had a difficulty time answering questions about PLOF or home living. Pt requires min assist when ambulating and SpO2 down as low as 83% on RA while ambulating, requiring 2L O2 to improve to 91%.  Given pt's current mobility status, recommending SNF at d/c.  Pt will benefit from skilled PT to increase their independence and safety with mobility to allow discharge to the venue listed below.      Follow Up Recommendations SNF;Supervision/Assistance - 24 hour    Equipment Recommendations  None recommended by PT    Recommendations for Other Services       Precautions / Restrictions Precautions Precautions: Fall;Other (comment) Precaution Comments: O2 Restrictions Weight Bearing Restrictions: No      Mobility  Bed Mobility Overal bed mobility: Needs Assistance Bed Mobility: Supine to Sit     Supine to sit: Min guard;HOB elevated     General bed mobility comments: Pt requires increased time and max use of bed rail.  Cues for sequencing.  SpO2 remains at or above 91% on 2L O2 with bed mobility.   Transfers Overall transfer level: Needs assistance Equipment used: Rolling walker (2 wheeled) Transfers: Sit to/from Stand Sit to Stand: Min assist         General transfer comment: Pt requires cues for technique and hand placement.  Assist to boost to standing.   Ambulation/Gait Ambulation/Gait assistance: Min assist Ambulation Distance (Feet): 25  Feet Assistive device: Rolling walker (2 wheeled) Gait Pattern/deviations: Step-through pattern;Decreased stride length;Trunk flexed;Wide base of support Gait velocity: decreased Gait velocity interpretation: Below normal speed for age/gender General Gait Details: Pt pushes RW too far ahead with flexed posture which improves temporarily with verbal cues.  Cues for pursed lip breathing which pt has difficulty demonstrating back to this PT.  SpO2 down as low as 83% on RA while ambulating, requiring 2L O2 to improve to 91%.  Pt with feet outside of BOS of RW when turning requiring assist and cues to correct.   Stairs            Wheelchair Mobility    Modified Rankin (Stroke Patients Only)       Balance Overall balance assessment: Needs assistance Sitting-balance support: No upper extremity supported;Feet supported Sitting balance-Leahy Scale: Fair     Standing balance support: Bilateral upper extremity supported;During functional activity Standing balance-Leahy Scale: Poor Standing balance comment: Pt relies on UE support for static and dynamic activities                             Pertinent Vitals/Pain Pain Assessment: No/denies pain    Home Living Family/patient expects to be discharged to:: Private residence Living Arrangements: Children Available Help at Discharge: Family Type of Home: House Home Access: Stairs to enter Entrance Stairs-Rails: Right Entrance Stairs-Number of Steps: 1 Home Layout: One level Home Equipment: Grab bars - tub/shower;Cane - single point(Pt has a walker but unable  to specify which type) Additional Comments: Pt says, "Maybe, I'm not sure" when asked if she has a BSC    Prior Function Level of Independence: Needs assistance   Gait / Transfers Assistance Needed: Pt ambulates with either RW or rollator at baseline (pt unable to specify AD).  Is not able to remember if she has had any falls in the past 6 months.    ADL's /  Homemaking Assistance Needed: Pt reports that her son does the cooking and driving.  She initially tells this PT that her son is disabled and had a head injury when he was young so she takes care of him but later reports that he assists her.  RN under impression that the pt assists her son.          Hand Dominance        Extremity/Trunk Assessment   Upper Extremity Assessment Upper Extremity Assessment: (BUE strength grossly 4-/5)    Lower Extremity Assessment Lower Extremity Assessment: (BLE strength grossly 4-/5)    Cervical / Trunk Assessment Cervical / Trunk Assessment: Kyphotic  Communication   Communication: HOH  Cognition Arousal/Alertness: Awake/alert Behavior During Therapy: Flat affect Overall Cognitive Status: No family/caregiver present to determine baseline cognitive functioning                                 General Comments: Impaired memory.  When asking questions the pt replies with "maybe" or "I think so" the majority of the time.       General Comments      Exercises     Assessment/Plan    PT Assessment Patient needs continued PT services  PT Problem List Decreased strength;Decreased activity tolerance;Decreased balance;Decreased mobility;Decreased cognition;Decreased knowledge of use of DME;Decreased safety awareness;Cardiopulmonary status limiting activity       PT Treatment Interventions DME instruction;Gait training;Stair training;Functional mobility training;Therapeutic activities;Therapeutic exercise;Balance training;Neuromuscular re-education;Cognitive remediation;Patient/family education    PT Goals (Current goals can be found in the Care Plan section)  Acute Rehab PT Goals Patient Stated Goal: to feel better PT Goal Formulation: With patient Time For Goal Achievement: 02/22/17 Potential to Achieve Goals: Good    Frequency Min 2X/week   Barriers to discharge Inaccessible home environment;Decreased caregiver support Step  to enter home, unclear amount of assist available from family/son.     Co-evaluation               AM-PAC PT "6 Clicks" Daily Activity  Outcome Measure Difficulty turning over in bed (including adjusting bedclothes, sheets and blankets)?: Unable Difficulty moving from lying on back to sitting on the side of the bed? : Unable Difficulty sitting down on and standing up from a chair with arms (e.g., wheelchair, bedside commode, etc,.)?: Unable Help needed moving to and from a bed to chair (including a wheelchair)?: Total Help needed walking in hospital room?: A Little Help needed climbing 3-5 steps with a railing? : A Lot 6 Click Score: 9    End of Session Equipment Utilized During Treatment: Gait belt;Oxygen Activity Tolerance: Patient limited by fatigue;Treatment limited secondary to medical complications (Comment)(hypoxia) Patient left: in chair;with call bell/phone within reach;with chair alarm set Nurse Communication: Mobility status;Other (comment)(SpO2) PT Visit Diagnosis: Muscle weakness (generalized) (M62.81);Unsteadiness on feet (R26.81);Other abnormalities of gait and mobility (R26.89)    Time: 0177-9390 PT Time Calculation (min) (ACUTE ONLY): 25 min   Charges:   PT Evaluation $PT Eval Low Complexity: 1 Low PT  Treatments $Gait Training: 8-22 mins   PT G Codes:   PT G-Codes **NOT FOR INPATIENT CLASS** Functional Assessment Tool Used: AM-PAC 6 Clicks Basic Mobility;Clinical judgement Functional Limitation: Mobility: Walking and moving around Mobility: Walking and Moving Around Current Status (E6950): At least 60 percent but less than 80 percent impaired, limited or restricted Mobility: Walking and Moving Around Goal Status [phone]): At least 20 percent but less than 40 percent impaired, limited or restricted    Collie Siad PT, DPT 02/08/2017, 10:10 AM

## 2017-02-08 NOTE — Care Management Obs Status (Signed)
[facility] NOTIFICATION   Patient Details  [name] MRN: [id number] Date of Birth: 15-Nov-1924   Medicare Observation Status Notification Given:  Yes    Shelbie Ammons, RN 02/08/2017, 8:39 AM

## 2017-02-08 NOTE — Care Management Note (Signed)
Case Management Note  Patient Details  [name] MRN: [id number] Date of Birth: 05-Mar-1924  Subjective/Objective: Admitted to [facility] with the diagnosis of hypoxia under observation status. Son Mikeal Hawthorne lives in the home. Son Jeneen Rinks lives in [place] [phone]). Sees Dr. Lovie Macadamia as primary care physician. Prescriptions are filled at [facility] on [address]. States she has had home health in the past, doesn't remember name of agency. [facility] 09/11/15. States she doesn't have home oxygen. Pulled note from 08/2015. States she has home oxygen per [place]. Will check with Corene Cornea, [place] representative. Rolling walker, cane, bedside commode, and shower chair in the home. Takes care of all basic activities of daily living herself, can drive, if needed. [facility] prior to this admission. Appetite "too good."                 Action/Plan: Physical therapy evaluation pending   Expected Discharge Date:                  Expected Discharge Plan:     In-House Referral:   yes  Discharge planning Services     Post Acute Care Choice:    Choice offered to:     DME Arranged:    DME Agency:     HH Arranged:    HH Agency:     Status of Service:     If discussed at [employer] of Stay Meetings, dates discussed:    Additional Comments:  Shelbie Ammons, RN MSN CCM Care Management [phone] 02/08/2017, 8:48 AM

## 2017-02-09 ENCOUNTER — Encounter
Admission: RE | Admit: 2017-02-09 | Discharge: 2017-02-09 | Disposition: A | Discharge status: Home / Self Care | Payer: Medicare Other | Source: Ambulatory Visit | Attending: Internal Medicine | Admitting: Internal Medicine

## 2017-02-09 LAB — GLUCOSE, CAPILLARY: Glucose-Capillary: 103 mg/dL — ABNORMAL HIGH (ref 65–99)

## 2017-02-09 MED ORDER — IPRATROPIUM-ALBUTEROL 0.5-2.5 (3) MG/3ML IN SOLN
3.0000 mL | Freq: Two times a day (BID) | RESPIRATORY_TRACT | Status: DC
  Administered 2017-02-09 – 2017-02-11 (×5): 3 mL via RESPIRATORY_TRACT
  Filled 2017-02-09 (×5): qty 3

## 2017-02-09 MED ORDER — IPRATROPIUM-ALBUTEROL 0.5-2.5 (3) MG/3ML IN SOLN: 3.0000 mL | RESPIRATORY_TRACT | Status: DC | PRN

## 2017-02-09 NOTE — Progress Notes (Signed)
Plan is for patient to D/C to [facility] Saturday 02/11/17 pending medical clearance. Patient's son [name] is aware of above. Kim admissions coordinator at [facility] is aware of above.   [employer], LCSW [phone]

## 2017-02-09 NOTE — Progress Notes (Signed)
Patient ID: Andrea Schaefer, female   DOB: [birth year], 83 y.o.   MRN: [id number]  [employer] PROGRESS NOTE  Andrea Schaefer DVV:[id number] DOB: [birth year] DOA: 02/06/2017 PCP: [name], MD  HPI/Subjective: Patient with hypoxia.  Patient feels okay.  Does not really complain of shortness of breath.  Occasional cough.  Objective: Vitals:   02/09/17 0728 02/09/17 1249  BP:  129/84  Pulse:  70  Resp:  20  Temp:  98.6 F (37 C)  SpO2: 95% 98%    Filed Weights   02/07/17 0408 02/08/17 0346 02/09/17 0445  Weight: 94.5 kg (208 lb 4.8 oz) 93.6 kg (206 lb 4.8 oz) 94.8 kg (209 lb)    ROS: Review of Systems  Constitutional: Negative for chills and fever.  Eyes: Negative for blurred vision.  Respiratory: Negative for cough and shortness of breath.   Cardiovascular: Negative for chest pain.  Gastrointestinal: Negative for abdominal pain, constipation, diarrhea, nausea and vomiting.  Genitourinary: Negative for dysuria.  Musculoskeletal: Negative for joint pain.  Neurological: Negative for dizziness and headaches.   Exam: Physical Exam  Constitutional: She is oriented to person, place, and time.  HENT:  Nose: No mucosal edema.  Mouth/Throat: No oropharyngeal exudate or posterior oropharyngeal edema.  Eyes: Conjunctivae, EOM and lids are normal. Pupils are equal, round, and reactive to light.  Neck: No JVD present. Carotid bruit is not present. No edema present. No thyroid mass and no thyromegaly present.  Cardiovascular: S1 normal and S2 normal. Exam reveals no gallop.  No murmur heard. Pulses:      Dorsalis pedis pulses are 2+ on the right side, and 2+ on the left side.  Respiratory: No respiratory distress. She has decreased breath sounds in the right lower field and the left lower field. She has no wheezes. She has rhonchi in the left lower field. She has no rales.  GI: Soft. Bowel sounds are normal. There is no tenderness.  Musculoskeletal:       Right ankle: She exhibits  swelling.       Left ankle: She exhibits swelling.  Lymphadenopathy:    She has no cervical adenopathy.  Neurological: She is alert and oriented to person, place, and time. No cranial nerve deficit.  Skin: Skin is warm. Nails show no clubbing.  Chronic lower extremity discoloration  Psychiatric: She has a normal mood and affect.      Data Reviewed: Basic Metabolic Panel: Recent Labs  Lab 02/06/17 2000 02/07/17 0418  NA 138 139  K 3.9 3.9  CL 105 107  CO2 26 27  GLUCOSE 122* 96  BUN 20 18  CREATININE 0.99 0.99  CALCIUM 9.0 8.5*   CBC: Recent Labs  Lab 02/06/17 2000 02/07/17 0418  WBC 9.3 6.5  NEUTROABS 7.5*  --   HGB 13.3 11.6*  HCT 42.1 36.1  MCV 86.1 86.6  PLT 276 242   Cardiac Enzymes: Recent Labs  Lab 02/06/17 2000  TROPONINI <0.03   BNP (last 3 results) Recent Labs    01/11/17 2237  BNP 229.0*      Studies: Ct Angio Chest Pe W Or Wo Contrast  Result Date: 02/08/2017 CLINICAL DATA:  Recent fall, hypoxia, shortness of breath EXAM: CT ANGIOGRAPHY CHEST WITH CONTRAST TECHNIQUE: Multidetector CT imaging of the chest was performed using the standard protocol during bolus administration of intravenous contrast. Multiplanar CT image reconstructions and MIPs were obtained to evaluate the vascular anatomy. CONTRAST:  41mL ISOVUE-370 IOPAMIDOL (ISOVUE-370) INJECTION 76% COMPARISON:  None. FINDINGS:  Cardiovascular: Pulmonary arteries appear patent. No significant filling defect or pulmonary embolus demonstrated by CTA. Thoracic aortic atherosclerosis noted. Patent 3 vessel arch anatomy. No aneurysm or dissection. Mild cardiomegaly. Native coronary atherosclerosis noted. No pericardial effusion. Mediastinum/Nodes: Small hiatal hernia noted.  No adenopathy. Lungs/Pleura: Scattered bibasilar, right middle lobe, and lingula atelectasis. No focal pneumonia, collapse or consolidation. No acute airspace process. Negative for edema or significant interstitial disease. No  pleural abnormality, effusion, or pneumothorax. Trachea and central airways are patent. Upper Abdomen: Scattered small hepatic hypodensities, suspect small hepatic cysts. Chronic numerous bilateral renal cysts more on the right kidney, which are partially imaged by chest CTA. Abdominal atherosclerosis evident. Degenerative changes noted of the spine. Musculoskeletal: Bones are osteopenic. No acute compression fracture, wedge-shaped deformity or focal kyphosis. Sternum appears intact. Review of the MIP images confirms the above findings. IMPRESSION: Negative for significant acute pulmonary embolus by CTA. Bibasilar, right middle lobe, lingula scattered atelectasis. Small hiatal hernia Aortic Atherosclerosis (ICD10-I70.0). Electronically Signed   By: Jerilynn Mages.  Shick M.D.   On: 02/08/2017 18:59    Scheduled Meds: . amLODipine  10 mg Oral Daily  . aspirin EC  81 mg Oral QHS  . budesonide (PULMICORT) nebulizer solution  0.5 mg Nebulization BID  . buPROPion  150 mg Oral Daily  . cholecalciferol  2,000 Units Oral Daily  . docusate sodium  100 mg Oral BID  . escitalopram  20 mg Oral Daily  . heparin  5,000 Units Subcutaneous Q8H  . hydrALAZINE  25 mg Oral TID  . ipratropium-albuterol  3 mL Nebulization BID  . levothyroxine  100 mcg Oral QAC breakfast  . methylPREDNISolone (SOLU-MEDROL) injection  40 mg Intravenous Daily  . metoprolol tartrate  50 mg Oral BID  . simvastatin  20 mg Oral QHS   Continuous Infusions: . levofloxacin (LEVAQUIN) IV Stopped (02/09/17 1224)    Assessment/Plan:  1. Acute hypoxic respiratory failure.  Chest x-ray negative.  CT scan of the chest negative for pulmonary embolism.  Does show atelectasis at the bases..   continue nebulizer treatments.  Incentive spirometry. 2. Acute cystitis with E. coli on Levaquin. 3. Essential hypertension on Norvasc, hydralazine and metoprolol 4. Weakness.  Physical therapy recommends rehab.  This is set up for Saturday. 5. Hypothyroidism  unspecified on levothyroxine 6. Hyperlipidemia unspecified on simvastatin  Code Status:     Code Status Orders  (From admission, onward)        Start     Ordered   02/06/17 2333  Full code  Continuous     02/06/17 2332    Code Status History    Date Active Date Inactive Code Status Order ID Comments User Context   09/08/2015 21:01 09/11/2015 12:43 Full Code [id number]  Max Sane, MD Inpatient   09/12/2014 20:25 09/18/2014 16:09 Full Code [id number]  Hower, Aaron Mose, MD ED    Advance Directive Documentation     Most Recent Value  Type of Advance Directive  Healthcare Power of Attorney  Pre-existing out of facility DNR order (yellow form or pink MOST form)  No data  "MOST" Form in Place?  No data     Family Communication: Spoke with son on phone. Disposition Plan: Patient will need a 3 night stay prior to going to rehab  Antibiotics:  Levaquin  Time spent: 26 minutes  [place]

## 2017-02-09 NOTE — Progress Notes (Signed)
Physical Therapy Treatment Patient Details [name] MRN: [id number] DOB: [birth year] Today's Date: 02/09/2017    History of Present Illness Pt is a 83 y/o M who presented with hypoxia s/p non-syncopal fall when putting on her pants.  UA positive for UTI.  Pt placed on 2L O2.  Pt's PMH includes cancer, gout, back surgery.      PT Comments    Asleep but awoke easily this am.  Breakfast untouched "I fell back asleep and forgot"  Pt confused calling for her son "[name]" saying she needed to find him  "He's in the den"  Pt orientated to place/time and was initially unaware she was in the hospital.  "I'm confused"  Voiced her concern over her handicapped son and him being home alone.  "He was born with brain damage."  She could recall his phone number and assistance was given to dial the phone she she could talk with him and she felt better after the call.  Pt noted to be incontinent of urine.  Rolling left/right for care without difficulty.  To edge of bed with rail and HOB raised with some difficulty but she was able to do without assist.  Sitting EOB without LOB for basic grooming tasks.  She stood with min guard and mod vc's for hand placements.  She stated her O2 felt low.  She was seated and noted to be 92%.  Pt on 1 lpm nasal canula.  She then stood and was able to ambulate x 2 around bed with walker and min guard.  No LOB or bucking noted.  Upon sitting in chair she was noted to be at 86% and recovered after 3 minutes with encouragement for proper breathing.  Participated in exercises as described below.  SNF remains appropriate for pt at this time.  Confusion, poor safety awareness, and new need for oxygen at this time will require further education and safety training.  She was unaware of O2 tubing and connections and is unable to manage on her own making it a tripping hazard for her.  Discussed O2 readings with primary nurse.  She may benefit from increased O2 for mobility skills to allow  for increased distances.     Follow Up Recommendations  SNF     Equipment Recommendations  Rolling walker with 5" wheels    Recommendations for Other Services       Precautions / Restrictions Precautions Precautions: Fall;Other (comment) Precaution Comments: O2 Restrictions Weight Bearing Restrictions: No    Mobility  Bed Mobility Overal bed mobility: Needs Assistance Bed Mobility: Supine to Sit     Supine to sit: Min guard;HOB elevated     General bed mobility comments: verbal cues for hand placements  Transfers Overall transfer level: Needs assistance Equipment used: Rolling walker (2 wheeled) Transfers: Sit to/from Stand Sit to Stand: Min assist            Ambulation/Gait     Assistive device: Rolling walker (2 wheeled) Gait Pattern/deviations: Step-through pattern;Decreased stride length;Trunk flexed;Wide base of support   Gait velocity interpretation: Below normal speed for age/gender     Stairs            Wheelchair Mobility    Modified Rankin (Stroke Patients Only)       Balance Overall balance assessment: Needs assistance Sitting-balance support: No upper extremity supported;Feet supported Sitting balance-Leahy Scale: Good     Standing balance support: Bilateral upper extremity supported;During functional activity Standing balance-Leahy Scale: Poor Standing balance comment:  Pt relies on UE support for static and dynamic activities                            Cognition Arousal/Alertness: Awake/alert Behavior During Therapy: WFL for tasks assessed/performed Overall Cognitive Status: No family/caregiver present to determine baseline cognitive functioning                                 General Comments: confused.  Pt aware of confusion.      Exercises Other Exercises Other Exercises: seated AROM for LAQ and ankle pumps x 10 Other Exercises: assisted with new pad and pericare due to inc urine     General Comments        Pertinent Vitals/Pain Pain Assessment: No/denies pain    Home Living                      Prior Function            PT Goals (current goals can now be found in the care plan section) Progress towards PT goals: Progressing toward goals    Frequency    Min 2X/week      PT Plan Current plan remains appropriate    Co-evaluation              AM-PAC PT "6 Clicks" Daily Activity  Outcome Measure  Difficulty turning over in bed (including adjusting bedclothes, sheets and blankets)?: A Little Difficulty moving from lying on back to sitting on the side of the bed? : A Little Difficulty sitting down on and standing up from a chair with arms (e.g., wheelchair, bedside commode, etc,.)?: Unable Help needed moving to and from a bed to chair (including a wheelchair)?: A Little Help needed walking in hospital room?: A Little Help needed climbing 3-5 steps with a railing? : A Lot 6 Click Score: 15    End of Session Equipment Utilized During Treatment: Gait belt;Oxygen Activity Tolerance: Treatment limited secondary to medical complications (Comment) Patient left: in chair;with chair alarm set;with call bell/phone within reach Nurse Communication: Mobility status;Other (comment)       Time: 1030-1053 PT Time Calculation (min) (ACUTE ONLY): 23 min  Charges:  $Gait Training: 8-22 mins $Therapeutic Exercise: 8-22 mins                    G Codes:      Chesley Noon, PTA 02/09/17, 11:27 AM

## 2017-02-10 LAB — GLUCOSE, CAPILLARY: GLUCOSE-CAPILLARY: 122 mg/dL — AB (ref 65–99)

## 2017-02-10 MED ORDER — DOCUSATE SODIUM 100 MG PO CAPS
100.0000 mg | ORAL_CAPSULE | Freq: Two times a day (BID) | ORAL | 0 refills | Status: DC
Start: 2017-02-10 — End: 2019-05-02

## 2017-02-10 MED ORDER — LEVOFLOXACIN 250 MG PO TABS
250.0000 mg | ORAL_TABLET | Freq: Once | ORAL | Status: AC
  Administered 2017-02-11: 09:00:00 250 mg via ORAL
  Filled 2017-02-10: qty 1

## 2017-02-10 MED ORDER — FLEET ENEMA 7-19 GM/118ML RE ENEM
1.0000 | ENEMA | Freq: Once | RECTAL | Status: AC
  Administered 2017-02-10: 1 via RECTAL

## 2017-02-10 MED ORDER — TRAZODONE HCL 50 MG PO TABS: 25.0000 mg | ORAL_TABLET | Freq: Every evening | ORAL | 0 refills | Status: DC | PRN

## 2017-02-10 MED ORDER — POLYETHYLENE GLYCOL 3350 17 G PO PACK: 17.0000 g | PACK | Freq: Every day | ORAL | 0 refills | Status: AC | PRN

## 2017-02-10 MED ORDER — IPRATROPIUM-ALBUTEROL 0.5-2.5 (3) MG/3ML IN SOLN: 3.0000 mL | Freq: Two times a day (BID) | RESPIRATORY_TRACT | 0 refills | Status: DC

## 2017-02-10 MED ORDER — POLYETHYLENE GLYCOL 3350 17 G PO PACK
17.0000 g | PACK | Freq: Every day | ORAL | Status: DC | PRN
  Administered 2017-02-11: 09:00:00 17 g via ORAL
  Filled 2017-02-10: qty 1

## 2017-02-10 NOTE — Progress Notes (Signed)
Plan is for patient to D/C to [facility] tomorrow to room 203-A pending medical clearance. RN will call report at [phone]. Clinical Education officer, museum (CSW) sent D/C summary to [facility] today via [facility]. CSW contacted patient's son [name] and made him aware of above.  [employer], LCSW [phone]

## 2017-02-10 NOTE — Care Management Important Message (Signed)
Important Message  Patient Details  [name] MRN: [id number] Date of Birth: 05/12/1924   Medicare Important Message Given:  Yes    Shelbie Ammons, RN 02/10/2017, 6:55 AM

## 2017-02-10 NOTE — Progress Notes (Signed)
Patient ID: Andrea Schaefer, female   DOB: Dec 08, 1924, 83 y.o.   MRN: [id number]  [facility] PROGRESS NOTE  EITHEL RYALL WUJ:[id number] DOB: 07-30-1924 DOA: 02/06/2017 PCP: [name], MD  HPI/Subjective: Patient feels okay.  Has some constipation.  No complaints of shortness of breath or cough.  Objective: Vitals:   02/10/17 1505 02/10/17 1511  BP:  (!) 129/52  Pulse:  73  Resp:  18  Temp:  98 F (36.7 C)  SpO2: (!) 83% 95%    Filed Weights   02/08/17 0346 02/09/17 0445 02/10/17 0625  Weight: 93.6 kg (206 lb 4.8 oz) 94.8 kg (209 lb) 95.4 kg (210 lb 6.4 oz)    ROS: Review of Systems  Constitutional: Negative for chills and fever.  Eyes: Negative for blurred vision.  Respiratory: Negative for cough and shortness of breath.   Cardiovascular: Negative for chest pain.  Gastrointestinal: Positive for constipation. Negative for abdominal pain, diarrhea, nausea and vomiting.  Genitourinary: Negative for dysuria.  Musculoskeletal: Negative for joint pain.  Neurological: Negative for dizziness and headaches.   Exam: Physical Exam  Constitutional: She is oriented to person, place, and time.  HENT:  Nose: No mucosal edema.  Mouth/Throat: No oropharyngeal exudate or posterior oropharyngeal edema.  Eyes: Conjunctivae, EOM and lids are normal. Pupils are equal, round, and reactive to light.  Neck: No JVD present. Carotid bruit is not present. No edema present. No thyroid mass and no thyromegaly present.  Cardiovascular: S1 normal and S2 normal. Exam reveals no gallop.  No murmur heard. Pulses:      Dorsalis pedis pulses are 2+ on the right side, and 2+ on the left side.  Respiratory: No respiratory distress. She has decreased breath sounds in the right lower field and the left lower field. She has no wheezes. She has no rhonchi. She has no rales.  GI: Soft. Bowel sounds are normal. There is no tenderness.  Musculoskeletal:       Right ankle: She exhibits swelling.        Left ankle: She exhibits swelling.  Lymphadenopathy:    She has no cervical adenopathy.  Neurological: She is alert and oriented to person, place, and time. No cranial nerve deficit.  Skin: Skin is warm. Nails show no clubbing.  Chronic lower extremity discoloration  Psychiatric: She has a normal mood and affect.      Data Reviewed: Basic Metabolic Panel: Recent Labs  Lab 02/06/17 2000 02/07/17 0418  NA 138 139  K 3.9 3.9  CL 105 107  CO2 26 27  GLUCOSE 122* 96  BUN 20 18  CREATININE 0.99 0.99  CALCIUM 9.0 8.5*   CBC: Recent Labs  Lab 02/06/17 2000 02/07/17 0418  WBC 9.3 6.5  NEUTROABS 7.5*  --   HGB 13.3 11.6*  HCT 42.1 36.1  MCV 86.1 86.6  PLT 276 242   Cardiac Enzymes: Recent Labs  Lab 02/06/17 2000  TROPONINI <0.03   BNP (last 3 results) Recent Labs    01/11/17 2237  BNP 229.0*      Studies: Ct Angio Chest Pe W Or Wo Contrast  Result Date: 02/08/2017 CLINICAL DATA:  Recent fall, hypoxia, shortness of breath EXAM: CT ANGIOGRAPHY CHEST WITH CONTRAST TECHNIQUE: Multidetector CT imaging of the chest was performed using the standard protocol during bolus administration of intravenous contrast. Multiplanar CT image reconstructions and MIPs were obtained to evaluate the vascular anatomy. CONTRAST:  45mL ISOVUE-370 IOPAMIDOL (ISOVUE-370) INJECTION 76% COMPARISON:  None. FINDINGS: Cardiovascular: Pulmonary arteries  appear patent. No significant filling defect or pulmonary embolus demonstrated by CTA. Thoracic aortic atherosclerosis noted. Patent 3 vessel arch anatomy. No aneurysm or dissection. Mild cardiomegaly. Native coronary atherosclerosis noted. No pericardial effusion. Mediastinum/Nodes: Small hiatal hernia noted.  No adenopathy. Lungs/Pleura: Scattered bibasilar, right middle lobe, and lingula atelectasis. No focal pneumonia, collapse or consolidation. No acute airspace process. Negative for edema or significant interstitial disease. No pleural abnormality,  effusion, or pneumothorax. Trachea and central airways are patent. Upper Abdomen: Scattered small hepatic hypodensities, suspect small hepatic cysts. Chronic numerous bilateral renal cysts more on the right kidney, which are partially imaged by chest CTA. Abdominal atherosclerosis evident. Degenerative changes noted of the spine. Musculoskeletal: Bones are osteopenic. No acute compression fracture, wedge-shaped deformity or focal kyphosis. Sternum appears intact. Review of the MIP images confirms the above findings. IMPRESSION: Negative for significant acute pulmonary embolus by CTA. Bibasilar, right middle lobe, lingula scattered atelectasis. Small hiatal hernia Aortic Atherosclerosis (ICD10-I70.0). Electronically Signed   By: Jerilynn Mages.  Shick M.D.   On: 02/08/2017 18:59    Scheduled Meds: . amLODipine  10 mg Oral Daily  . aspirin EC  81 mg Oral QHS  . budesonide (PULMICORT) nebulizer solution  0.5 mg Nebulization BID  . buPROPion  150 mg Oral Daily  . cholecalciferol  2,000 Units Oral Daily  . docusate sodium  100 mg Oral BID  . escitalopram  20 mg Oral Daily  . heparin  5,000 Units Subcutaneous Q8H  . hydrALAZINE  25 mg Oral TID  . ipratropium-albuterol  3 mL Nebulization BID  . [START ON 02/11/2017] levofloxacin  250 mg Oral Once  . levothyroxine  100 mcg Oral QAC breakfast  . methylPREDNISolone (SOLU-MEDROL) injection  40 mg Intravenous Daily  . metoprolol tartrate  50 mg Oral BID  . simvastatin  20 mg Oral QHS  . sodium phosphate  1 enema Rectal Once    Assessment/Plan:  1. Acute hypoxic respiratory failure.  Chest x-ray negative.  CT scan of the chest negative for pulmonary embolism.  Does show atelectasis at the bases..   continue nebulizer treatments.  Incentive spirometry. 2. Acute cystitis with E. coli on Levaquin.  Last dose will be tomorrow morning 3. Essential hypertension on Norvasc, hydralazine and metoprolol 4. Weakness.  Physical therapy recommends rehab.  This is set up for  Saturday. 5. Hypothyroidism unspecified on levothyroxine 6. Hyperlipidemia unspecified on simvastatin  Code Status:     Code Status Orders  (From admission, onward)        Start     Ordered   02/06/17 2333  Full code  Continuous     02/06/17 2332    Code Status History    Date Active Date Inactive Code Status Order ID Comments User Context   09/08/2015 21:01 09/11/2015 12:43 Full Code [id number]  Max Sane, MD Inpatient   09/12/2014 20:25 09/18/2014 16:09 Full Code [id number]  Hower, Aaron Mose, MD ED    Advance Directive Documentation     Most Recent Value  Type of Advance Directive  Healthcare Power of Attorney  Pre-existing out of facility DNR order (yellow form or pink MOST form)  No data  "MOST" Form in Place?  No data     Family Communication: Spoke with son on phone   Yesterday Disposition Plan: Patient will need a 3 night stay prior to going to rehab  Antibiotics:  Levaquin  Time spent: 35 minutes  [place]

## 2017-02-10 NOTE — Progress Notes (Signed)
Physical Therapy Treatment Patient Details [name] MRN: [id number] DOB: 09-23-1924 Today's Date: 02/10/2017    History of Present Illness Pt is a 83 y/o M who presented with hypoxia s/p non-syncopal fall when putting on her pants.  UA positive for UTI.  Pt placed on 2L O2.  Pt's PMH includes cancer, gout, back surgery.      PT Comments    Pt awake and alert this am.  Less confused this am, seemed generally orientated but still unsure about oxygen tubing and use.  Pt was able to get to edge of bed with HOB raised, rail and increased effort and multiple attempts and encouragement.  Once sitting, no LOB noted.  She stood with min guard and multiple attempts and verbal cues for hand placements. Once standing she was able to ambulate around unit x 1 with walker and O2 at 2 lpm support.  Sats monitored throughout and remained 88-94% on 2 lpm.  Stopping occasionally to encourage deep breathing to increase back into 90's.  Trial of gait without O2 support on room air and pt was able to ambulate 100' returning to 88% when she voiced being SOB and tired.    Pt with overall increased mobility today and decreased confusion.  Discussed with care manager regarding discharge plan.  After discussion with care manager will continue to recommend SNF due to pt's inability to manage oxygen at home and general need for increased care to manage medical conditions.   Follow Up Recommendations  SNF     Equipment Recommendations  Rolling walker with 5" wheels    Recommendations for Other Services       Precautions / Restrictions Precautions Precautions: Fall;Other (comment) Precaution Comments: O2 Restrictions Weight Bearing Restrictions: No    Mobility  Bed Mobility Overal bed mobility: Needs Assistance Bed Mobility: Supine to Sit     Supine to sit: Min guard;HOB elevated     General bed mobility comments: verbal cues for hand placements and encouragement to continue to  try  Transfers Overall transfer level: Needs assistance Equipment used: Rolling walker (2 wheeled) Transfers: Sit to/from Stand Sit to Stand: Min assist         General transfer comment: Pt requires cues for technique and hand placement.  multiple attempts to stand  Ambulation/Gait Ambulation/Gait assistance: Min guard Ambulation Distance (Feet): 200 Feet Assistive device: Rolling walker (2 wheeled) Gait Pattern/deviations: Step-through pattern;Wide base of support;Decreased step length - right;Decreased step length - left Gait velocity: decreased Gait velocity interpretation: Below normal speed for age/gender     Stairs            Wheelchair Mobility    Modified Rankin (Stroke Patients Only)       Balance Overall balance assessment: Needs assistance Sitting-balance support: No upper extremity supported;Feet supported Sitting balance-Leahy Scale: Good     Standing balance support: Bilateral upper extremity supported;During functional activity Standing balance-Leahy Scale: Fair Standing balance comment: Pt relies on UE support for static and dynamic activities                            Cognition Arousal/Alertness: Awake/alert Behavior During Therapy: WFL for tasks assessed/performed Overall Cognitive Status: No family/caregiver present to determine baseline cognitive functioning  Exercises Other Exercises Other Exercises: seated AROM for LAQ and ankle pumps x 10    General Comments        Pertinent Vitals/Pain Pain Assessment: No/denies pain    Home Living                      Prior Function            PT Goals (current goals can now be found in the care plan section) Progress towards PT goals: Progressing toward goals    Frequency    Min 2X/week      PT Plan Current plan remains appropriate    Co-evaluation              AM-PAC PT "6 Clicks" Daily  Activity  Outcome Measure  Difficulty turning over in bed (including adjusting bedclothes, sheets and blankets)?: A Little Difficulty moving from lying on back to sitting on the side of the bed? : A Little Difficulty sitting down on and standing up from a chair with arms (e.g., wheelchair, bedside commode, etc,.)?: Unable Help needed moving to and from a bed to chair (including a wheelchair)?: A Little Help needed walking in hospital room?: A Little Help needed climbing 3-5 steps with a railing? : A Lot 6 Click Score: 15    End of Session Equipment Utilized During Treatment: Gait belt;Oxygen     Nurse Communication: Mobility status       Time: 0951-1010 PT Time Calculation (min) (ACUTE ONLY): 19 min  Charges:  $Gait Training: 8-22 mins                    G Codes:      Chesley Noon, PTA 02/10/17, 10:32 AM

## 2017-02-10 NOTE — Discharge Summary (Addendum)
[place] [facility] [place] [name]    MR#:  [id number]  DATE OF BIRTH:  1924-12-17  DATE OF ADMISSION:  02/06/2017 ADMITTING PHYSICIAN: Max Sane, MD  DATE OF DISCHARGE: 02/11/2017  PRIMARY CARE PHYSICIAN: [name], MD    ADMISSION DIAGNOSIS:  Fall [W19.XXXA] Hypoxia [R09.02] Fall, initial encounter [id number]  DISCHARGE DIAGNOSIS:  Active Problems:   Hypoxia   SECONDARY DIAGNOSIS:   Past Medical History:  Diagnosis Date  . Anxiety   . Cancer [facility]) 2011   Right  . Gout   . Hypertension   . Hypothyroidism   . Personal history of radiation therapy   . Thyroid disease     HOSPITAL COURSE:   1.  Acute hypoxic respiratory failure.  Chest x-ray was negative.  CT scan negative for pulmonary embolism but does show atelectasis at the bases.  I started nebulizer treatments and and sense of tight spirometry.  Actually gave a couple doses of Solu-Medrol.  Check pulse ox with ambulation. 2.  Acute cystitis E. Coli.  Patient will have received a full course of Levaquin while here. 3.  Essential hypertension on Norvasc hydralazine and metoprolol 4.  Weakness physical therapy recommends rehab 5.  Hypothyroidism unspecified on levothyroxine 6.  Hyperlipidemia unspecified on simvastatin 7.  On the morning of discharge the patient has some eye irritation.  The patient states that she has been messing with them.  She has been on systemic antibiotics so I do not think this is infection but I will give Ciloxan eyedrop to go home with.  After the Ciloxan eyedrop is finished in 5 days can consider natural tears to help out with lubrication.  DISCHARGE CONDITIONS:   Satisfactory  CONSULTS OBTAINED:  None  DRUG ALLERGIES:   Allergies  Allergen Reactions  . Codeine Nausea And Vomiting  . Tetracyclines & Related Nausea And Vomiting  . Adhesive [Tape] Rash  . Keflex [Cephalexin] Rash  . Naproxen Rash    DISCHARGE MEDICATIONS:    Allergies as of 02/10/2017      Reactions   Codeine Nausea And Vomiting   Tetracyclines & Related Nausea And Vomiting   Adhesive [tape] Rash   Keflex [cephalexin] Rash   Naproxen Rash      Medication List    STOP taking these medications   levofloxacin 250 MG tablet Commonly known as:  LEVAQUIN     TAKE these medications   acetaminophen 325 MG tablet Commonly known as:  TYLENOL Take 2 tablets (650 mg total) by mouth every 6 (six) hours as needed for mild pain, moderate pain or headache (headache).   amLODipine 10 MG tablet Commonly known as:  NORVASC Take 1 tablet (10 mg total) by mouth daily.   aspirin EC 81 MG tablet Take 81 mg by mouth at bedtime.   buPROPion 150 MG 24 hr tablet Commonly known as:  WELLBUTRIN XL Take 150 mg by mouth daily.   cholecalciferol 1000 units tablet Commonly known as:  VITAMIN D Take 2,000 Units by mouth daily.   docusate sodium 100 MG capsule Commonly known as:  COLACE Take 1 capsule (100 mg total) by mouth 2 (two) times daily.   escitalopram 20 MG tablet Commonly known as:  LEXAPRO Take 20 mg by mouth daily.   hydrALAZINE 25 MG tablet Commonly known as:  APRESOLINE Take 1 tablet (25 mg total) by mouth 3 (three) times daily.   ipratropium-albuterol 0.5-2.5 (3) MG/3ML Soln Commonly known as:  DUONEB Take  3 mLs by nebulization 2 (two) times daily.   levothyroxine 100 MCG tablet Commonly known as:  SYNTHROID, LEVOTHROID Take 100 mcg by mouth daily before breakfast.   metoprolol tartrate 50 MG tablet Commonly known as:  LOPRESSOR Take 1 tablet (50 mg total) by mouth 2 (two) times daily.   polyethylene glycol packet Commonly known as:  MIRALAX / GLYCOLAX Take 17 g by mouth daily as needed for moderate constipation or severe constipation.   simvastatin 20 MG tablet Commonly known as:  ZOCOR Take 20 mg by mouth at bedtime.   traZODone 50 MG tablet Commonly known as:  DESYREL Take 0.5 tablets (25 mg total) by mouth at  bedtime as needed for sleep.        DISCHARGE INSTRUCTIONS:   Follow-up with rehab 1 day  If you experience worsening of your admission symptoms, develop shortness of breath, life threatening emergency, suicidal or homicidal thoughts you must seek medical attention immediately by calling 911 or calling your MD immediately  if symptoms less severe.  You Must read complete instructions/literature along with all the possible adverse reactions/side effects for all the Medicines you take and that have been prescribed to you. Take any new Medicines after you have completely understood and accept all the possible adverse reactions/side effects.   Please note  You were cared for by a hospitalist during your hospital stay. If you have any questions about your discharge medications or the care you received while you were in the hospital after you are discharged, you can call the unit and asked to speak with the hospitalist on call if the hospitalist that took care of you is not available. Once you are discharged, your primary care physician will handle any further medical issues. Please note that NO REFILLS for any discharge medications will be authorized once you are discharged, as it is imperative that you return to your primary care physician (or establish a relationship with a primary care physician if you do not have one) for your aftercare needs so that they can reassess your need for medications and monitor your lab values.    Today   CHIEF COMPLAINT:   Chief Complaint  Patient presents with  . Fall    HISTORY OF PRESENT ILLNESS:  Andrea Schaefer  is a 83 y.o. female presented with a fall and was found to be hypoxic   VITAL SIGNS:  Blood pressure (!) 129/52, pulse 73, temperature 98 F (36.7 C), temperature source Oral, resp. rate 18, height 5\' 3"  (1.6 m), weight 95.4 kg (210 lb 6.4 oz), SpO2 95 %.    PHYSICAL EXAMINATION:  GENERAL:  83 y.o.-year-old patient lying in the bed with no  acute distress.  EYES: Pupils equal, round, reactive to light and accommodation. No scleral icterus. Extraocular muscles intact.  Erythema seen bilateral eyes right greater than left. HEENT: Head atraumatic, normocephalic. Oropharynx and nasopharynx clear.   NECK:  Supple, no jugular venous distention. No thyroid enlargement, no tenderness.  LUNGS: Decreased breath sounds bilaterally, no wheezing, rales,rhonchi or crepitation. No use of accessory muscles of respiration.  CARDIOVASCULAR: S1, S2 normal. No murmurs, rubs, or gallops.  ABDOMEN: Soft, non-tender, non-distended. Bowel sounds present. No organomegaly or mass.  EXTREMITIES:  2+ edema, cyanosis, or clubbing.  NEUROLOGIC: Cranial nerves II through XII are intact. Muscle strength 5/5 in all extremities. Sensation intact. Gait not checked.  PSYCHIATRIC: The patient is alert.  SKIN: No obvious rash, lesion, or ulcer.   DATA REVIEW:   CBC Recent  Labs  Lab 02/07/17 0418  WBC 6.5  HGB 11.6*  HCT 36.1  PLT 242    Chemistries  Recent Labs  Lab 02/07/17 0418  NA 139  K 3.9  CL 107  CO2 27  GLUCOSE 96  BUN 18  CREATININE 0.99  CALCIUM 8.5*    Cardiac Enzymes Recent Labs  Lab 02/06/17 2000  TROPONINI <0.03    Microbiology Results  Results for orders placed or performed during the hospital encounter of 01/11/17  Urine culture     Status: Abnormal   Collection Time: 01/11/17 11:16 PM  Result Value Ref Range Status   Specimen Description URINE, RANDOM  Final   Special Requests NONE  Final   Culture >=100,000 COLONIES/mL ESCHERICHIA COLI (A)  Final   Report Status 01/14/2017 FINAL  Final   Organism ID, Bacteria ESCHERICHIA COLI (A)  Final      Susceptibility   Escherichia coli - MIC*    AMPICILLIN >=32 RESISTANT Resistant     CEFAZOLIN <=4 SENSITIVE Sensitive     CEFTRIAXONE <=1 SENSITIVE Sensitive     CIPROFLOXACIN <=0.25 SENSITIVE Sensitive     GENTAMICIN <=1 SENSITIVE Sensitive     IMIPENEM <=0.25 SENSITIVE  Sensitive     NITROFURANTOIN 64 INTERMEDIATE Intermediate     TRIMETH/SULFA <=20 SENSITIVE Sensitive     AMPICILLIN/SULBACTAM 16 INTERMEDIATE Intermediate     PIP/TAZO <=4 SENSITIVE Sensitive     Extended ESBL NEGATIVE Sensitive     * >=100,000 COLONIES/mL ESCHERICHIA COLI    RADIOLOGY:  Ct Angio Chest Pe W Or Wo Contrast  Result Date: 02/08/2017 CLINICAL DATA:  Recent fall, hypoxia, shortness of breath EXAM: CT ANGIOGRAPHY CHEST WITH CONTRAST TECHNIQUE: Multidetector CT imaging of the chest was performed using the standard protocol during bolus administration of intravenous contrast. Multiplanar CT image reconstructions and MIPs were obtained to evaluate the vascular anatomy. CONTRAST:  49mL ISOVUE-370 IOPAMIDOL (ISOVUE-370) INJECTION 76% COMPARISON:  None. FINDINGS: Cardiovascular: Pulmonary arteries appear patent. No significant filling defect or pulmonary embolus demonstrated by CTA. Thoracic aortic atherosclerosis noted. Patent 3 vessel arch anatomy. No aneurysm or dissection. Mild cardiomegaly. Native coronary atherosclerosis noted. No pericardial effusion. Mediastinum/Nodes: Small hiatal hernia noted.  No adenopathy. Lungs/Pleura: Scattered bibasilar, right middle lobe, and lingula atelectasis. No focal pneumonia, collapse or consolidation. No acute airspace process. Negative for edema or significant interstitial disease. No pleural abnormality, effusion, or pneumothorax. Trachea and central airways are patent. Upper Abdomen: Scattered small hepatic hypodensities, suspect small hepatic cysts. Chronic numerous bilateral renal cysts more on the right kidney, which are partially imaged by chest CTA. Abdominal atherosclerosis evident. Degenerative changes noted of the spine. Musculoskeletal: Bones are osteopenic. No acute compression fracture, wedge-shaped deformity or focal kyphosis. Sternum appears intact. Review of the MIP images confirms the above findings. IMPRESSION: Negative for significant  acute pulmonary embolus by CTA. Bibasilar, right middle lobe, lingula scattered atelectasis. Small hiatal hernia Aortic Atherosclerosis (ICD10-I70.0). Electronically Signed   By: Jerilynn Mages.  Shick M.D.   On: 02/08/2017 18:59      Management plans discussed with the patient, family and they are in agreement.  CODE STATUS:     Code Status Orders  (From admission, onward)        Start     Ordered   02/06/17 2333  Full code  Continuous     02/06/17 2332    Code Status History    Date Active Date Inactive Code Status Order ID Comments User Context   09/08/2015 21:01 09/11/2015 12:43  Full Code [id number]  Max Sane, MD Inpatient   09/12/2014 20:25 09/18/2014 16:09 Full Code [id number]  Hower, Aaron Mose, MD ED    Advance Directive Documentation     Most Recent Value  Type of Advance Directive  Healthcare Power of Attorney  Pre-existing out of facility DNR order (yellow form or pink MOST form)  No data  "MOST" Form in Place?  No data      TOTAL TIME TAKING CARE OF THIS PATIENT: 35 minutes.    Loletha Grayer M.D on 02/10/2017 at 3:13 PM  Between 7am to 6pm - Pager - [phone]  After 6pm go to [email or web address] - password [email or web address] [facility] Office  [phone]  CC: Primary care physician; [name], MD

## 2017-02-11 MED ORDER — CIPROFLOXACIN HCL 0.3 % OP SOLN
1.0000 [drp] | OPHTHALMIC | Status: DC
  Administered 2017-02-11: 09:00:00 1 [drp] via OPHTHALMIC
  Filled 2017-02-11: qty 2.5

## 2017-02-11 MED ORDER — SODIUM CHLORIDE 0.9% FLUSH
3.0000 mL | Freq: Two times a day (BID) | INTRAVENOUS | Status: DC
  Administered 2017-02-11: 3 mL via INTRAVENOUS

## 2017-02-11 MED ORDER — CIPROFLOXACIN HCL 0.3 % OP SOLN: 1.0000 [drp] | OPHTHALMIC | 0 refills | Status: DC

## 2017-02-11 NOTE — Progress Notes (Signed)
[facility]  Here to transport pt to [facility]. Report given. Discharge packet sent with pt.

## 2017-02-11 NOTE — Clinical Social Work Note (Signed)
The patient will discharge today via non-emergent EMS to [facility] (report # [phone]). The patient's son and the facility are aware and in agreement. The CSW has sent all updated documentation to the facility. CSW will continue to follow pending additional discharge needs.  Santiago Bumpers, MSW, Latanya Presser [phone]

## 2017-02-11 NOTE — Progress Notes (Signed)
Patient ID: Andrea Schaefer, female   DOB: [birth year], 83 y.o.   MRN: [id number]  [facility] PROGRESS NOTE  Andrea Schaefer Schaefer DOB: 03/14/1924 DOA: 02/06/2017 PCP: [name], MD  HPI/Subjective: Patient states that she is been messing with her eyes last night.  They are a little red today.  Had a bowel movement yesterday with an enema.  Objective: Vitals:   02/10/17 2105 02/11/17 0411  BP:  (!) 140/59  Pulse:  66  Resp:  20  Temp:  97.9 F (36.6 C)  SpO2: 96% 96%    Filed Weights   02/09/17 0445 02/10/17 0625 02/11/17 0410  Weight: 94.8 kg (209 lb) 95.4 kg (210 lb 6.4 oz) 95 kg (209 lb 8 oz)    ROS: Review of Systems  Constitutional: Negative for chills and fever.  Eyes: Negative for blurred vision.  Respiratory: Negative for cough and shortness of breath.   Cardiovascular: Negative for chest pain.  Gastrointestinal: Negative for constipation, diarrhea, nausea and vomiting.  Genitourinary: Negative for dysuria.  Musculoskeletal: Negative for joint pain.  Neurological: Negative for dizziness and headaches.   Exam: Physical Exam  HENT:  Nose: No mucosal edema.  Mouth/Throat: No oropharyngeal exudate or posterior oropharyngeal edema.  Eyes: Conjunctivae, EOM and lids are normal. Pupils are equal, round, and reactive to light.  Erythema bilateral eyes right greater than left  Neck: No JVD present. Carotid bruit is not present. No edema present. No thyroid mass and no thyromegaly present.  Cardiovascular: S1 normal and S2 normal. Exam reveals no gallop.  No murmur heard. Pulses:      Dorsalis pedis pulses are 2+ on the right side, and 2+ on the left side.  Respiratory: No respiratory distress. She has decreased breath sounds in the right lower field and the left lower field. She has no wheezes. She has no rhonchi. She has no rales.  GI: Soft. Bowel sounds are normal. There is no tenderness.  Musculoskeletal:       Right ankle: She exhibits no swelling.        Left ankle: She exhibits no swelling.  Lymphadenopathy:    She has no cervical adenopathy.  Neurological: She is alert. No cranial nerve deficit.  Skin: Skin is warm. No rash noted. Nails show no clubbing.  Psychiatric: She has a normal mood and affect.      Data Reviewed: Basic Metabolic Panel: Recent Labs  Lab 02/06/17 2000 02/07/17 0418  NA 138 139  K 3.9 3.9  CL 105 107  CO2 26 27  GLUCOSE 122* 96  BUN 20 18  CREATININE 0.99 0.99  CALCIUM 9.0 8.5*   CBC: Recent Labs  Lab 02/06/17 2000 02/07/17 0418  WBC 9.3 6.5  NEUTROABS 7.5*  --   HGB 13.3 11.6*  HCT 42.1 36.1  MCV 86.1 86.6  PLT 276 242   Cardiac Enzymes: Recent Labs  Lab 02/06/17 2000  TROPONINI <0.03   BNP (last 3 results) Recent Labs    01/11/17 2237  BNP 229.0*    CBG: Recent Labs  Lab 02/09/17 0751 02/10/17 0739  GLUCAP 103* 122*     Scheduled Meds: . amLODipine  10 mg Oral Daily  . aspirin EC  81 mg Oral QHS  . budesonide (PULMICORT) nebulizer solution  0.5 mg Nebulization BID  . buPROPion  150 mg Oral Daily  . cholecalciferol  2,000 Units Oral Daily  . ciprofloxacin  1 drop Both Eyes Q4H while awake  . docusate sodium  100  mg Oral BID  . escitalopram  20 mg Oral Daily  . heparin  5,000 Units Subcutaneous Q8H  . hydrALAZINE  25 mg Oral TID  . ipratropium-albuterol  3 mL Nebulization BID  . levofloxacin  250 mg Oral Once  . levothyroxine  100 mcg Oral QAC breakfast  . methylPREDNISolone (SOLU-MEDROL) injection  40 mg Intravenous Daily  . metoprolol tartrate  50 mg Oral BID  . simvastatin  20 mg Oral QHS   Continuous Infusions:  Assessment/Plan:  1. Acute hypoxic respiratory failure.  Continue oxygen 2 L nasal cannula.  Stop steroids today. 2. Acute cystitis with E. coli.  Finished Levaquin here in the hospital 3. Essential hypertension on Norvasc hydralazine and metoprolol 4. Weakness physical therapy recommends rehab patient will go out to rehab  today 5. Hypothyroidism unspecified on levothyroxine 6. Hyperlipidemia unspecified on simvastatin 7. Erythema bilateral eyes.  I doubt this is bacterial since the patient has been on systemic antibiotics.  I will give Ciloxan eyedrop though.  Consider natural tears afterwards.  I think this is likely I allergy or dry eyes that is causing the issue.  Code Status:     Code Status Orders  (From admission, onward)        Start     Ordered   02/06/17 2333  Full code  Continuous     02/06/17 2332    Code Status History    Date Active Date Inactive Code Status Order ID Comments User Context   09/08/2015 21:01 09/11/2015 12:43 Full Code [id number]  Max Sane, MD Inpatient   09/12/2014 20:25 09/18/2014 16:09 Full Code [id number]  Hower, Aaron Mose, MD ED    Advance Directive Documentation     Most Recent Value  Type of Advance Directive  Healthcare Power of Attorney  Pre-existing out of facility DNR order (yellow form or pink MOST form)  No data  "MOST" Form in Place?  No data     Family Communication: Spoke with son on the phone Disposition Plan: Rehab today  Time spent: 31 minutes  [place]

## 2017-02-11 NOTE — Discharge Instructions (Signed)
Fall Prevention in Hospitals, Adult WHAT ARE SOME SAFETY TIPS FOR PREVENTING FALLS? If you or a loved one has to stay in the hospital, talk with the health care providers about the risk of falling. Find out which medicines or treatments can cause dizziness or affect balance. Make a plan with the health care providers to prevent falls. The plan may include these points:  Ask for help moving around, especially after surgery or when feeling unwell.  Have support available when getting up and moving around.  Wear nonskid footwear.  Use the safety straps on wheelchairs.  Ask for help to get objects that are out of reach.  Wear eyeglasses.  Remove all clutter from the floor and the sides of the bed.  Keep equipment and wires securely out of the way.  Keep the bed locked in the low position.  Keep the side rails up on the bed.  Keep the nurse call button within reach.  Keep the door open when no one else is in the room.  Have someone stay in the hospital with you or your loved one.  Ask for a bed alarm if you are not able to stay with your loved one who is at risk for getting up without help.  Ask if sleeping pills or other medicines that alter mental status are necessary. WHAT INCREASES THE RISK FOR FALLS? Certain conditions and treatments may increase a patient's risk for falls in a hospital. These include:  Being in an unfamiliar environment.  Being on bed rest.  Having surgery.  Taking certain medicines, such as sleeping pills.  Having tubes in place, such as IV lines or catheters. Additional risk factors for falls in a hospital include:  Having vision problems.  Having a change in thinking, feeling, or behavior (altered mental status).  Having trouble with balance.  Needing to use the toilet frequently.  Having fallen in the past three months.  Having low blood pressure when standing up quickly (orthostatic hypotension). WHAT DOES THE HOSPITAL STAFF DO TO HELP  PREVENT ME OR MY LOVED ONE FROM FALLING? Hospitals have systems in place to prevent falls and accidents. Talk with the hospital staff about:  Doing an assessment to discuss fall risks and create a personalized plan to prevent falls.  Checking in regularly to see if help is needed for moving around and to assess any changes in fall risk.  Knowing where the nurse call button is and how to use it. Use this to call a nursing care provider any time.  Keeping personal items within reach. This includes eyeglasses, phones, and other electronic devices.  Following general safety guidelines when moving around.  Keeping the area around the bed free from clutter.  Removing unnecessary equipment or tubes to reduce the risk of tripping.  Using safety equipment, such as:  Walkers, crutches, and other walking devices for support.  Safety rails on the bed.  Safety straps in the bed.  A bed that can be lowered and locked to prevent movement.  Handrails in the bathroom.  Nonskid socks and shoes.  Locking mechanisms to secure equipment in place.  Lifting and transfer equipment. WHAT CAN I DO TO HELP PREVENT A FALL?  Talk with health care providers about fall prevention.  Have a personalized fall prevention plan in place.  Do not try to move around if you feel off balance or ill.  Change position slowly.  Sit on the side of the bed before standing up.  Sit down and call   for help if you feel dizzy or unsteady when standing.  Keep the hospital room clear of clutter. WHEN SHOULD I ASK FOR HELP? Ask for help whenever you:  Are not sure if you are able to move around safely.  Feel dizzy or unsteady.  Are not comfortable helping your loved one move around or use the bathroom. If you or a loved one falls, tell the hospital staff. This is important. This information is not intended to replace advice given to you by your health care provider. Make sure you discuss any questions you have  with your health care provider. Document Released: 01/29/2000 Document Revised: 06/30/2015 Document Reviewed: 11/13/2014 Elsevier Interactive Patient Education  2017 [employer]  

## 2017-02-11 NOTE — Plan of Care (Addendum)
Pt ready for discharge to [facility] with pt stating she agrees with this. TC report to [facility] with pt accepted. Pt prepared for transport. Discharge packet completed with AVS instructions which were reviewed orally with pt; prescriptions in packet. EMS request for transport submitted with 02. Eye drops were packed to go with pt.

## 2017-02-11 NOTE — Plan of Care (Signed)
Denies co's/issues. States ready for discharge.

## 2017-02-14 ENCOUNTER — Encounter
Admission: RE | Admit: 2017-02-14 | Discharge: 2017-02-14 | Disposition: A | Discharge status: Home / Self Care | Payer: Medicare Other | Source: Ambulatory Visit | Attending: Internal Medicine | Admitting: Internal Medicine

## 2017-02-17 ENCOUNTER — Non-Acute Institutional Stay (SKILLED_NURSING_FACILITY): Payer: Medicare Other | Admitting: Gerontology

## 2017-02-17 DIAGNOSIS — R609 Edema, unspecified: Secondary | ICD-10-CM

## 2017-02-17 DIAGNOSIS — R600 Localized edema: Secondary | ICD-10-CM

## 2017-02-17 DIAGNOSIS — R0902 Hypoxemia: Secondary | ICD-10-CM

## 2017-02-23 ENCOUNTER — Non-Acute Institutional Stay (SKILLED_NURSING_FACILITY): Payer: Medicare Other | Admitting: Gerontology

## 2017-02-23 DIAGNOSIS — R609 Edema, unspecified: Secondary | ICD-10-CM

## 2017-02-24 ENCOUNTER — Encounter: Payer: Self-pay | Admitting: Gerontology

## 2017-02-24 NOTE — Progress Notes (Signed)
Location:    [facility] [place] [facility] Room Number: [place] of Service:  SNF [phone]) Provider:  Toni Arthurs, NP-C  [name], MD  Patient Care Team: [name], MD as PCP - General [facility])  Extended Emergency Contact Information Primary Emergency Contact: Dall,James Address: [address], [place] [address] [address]net[address]itter of [place] Phone: [phone] Work Phone: [phone] Mobile Phone: [phone] Relation: Son Secondary Emergency Contact: Thurston Pounds Mobile Phone: [phone] Relation: Niece Interpreter needed? No  Code Status:  FULL Goals of care: Advanced Directive information Advanced Directives 02/23/2017  Does Patient Have a Medical Advance Directive? No  Type of Advance Directive -  Does patient want to make changes to medical advance directive? -  Copy of [place] in Chart? -  Would patient like information on creating a medical advance directive? -     Chief Complaint  Patient presents with  . Acute Visit    Recheck Edema    HPI:  Pt is a 84 y.o. female seen today for an acute visit for worsening BLE edema. Staff reports a 9 lb weight gain since admission. Pt c/o legs becoming uncomfortable d/t the excess fluid. No redness, no excessive warmth. B- pedal pulses equal and strong. Pt denies chest pain or shortness of breath, but pt had audible expiratory wheezing. No cough or congestion. 3+ BLE pitting edema. Pt is on O2 2L [place]. Otherwise, pt reports she is feeling ok. VSS. No other complaints.   Past Medical History:  Diagnosis Date  . Anxiety    unspecified  . Arthritis   . Cancer [facility]) 2011   Right  . Cataract cortical, senile   . Depression   . Gout   . History of breast cancer   . History of poliomyelitis    as a child  . History of rosacea   . Hyperlipidemia, unspecified   . Hypertension   . Hypothyroidism    unspecified  . Osteoporosis, post-menopausal   . Pericarditis   .  Personal history of radiation therapy   . Renal insufficiency   . Seborrheic dermatitis   . Thyroid disease    Past Surgical History:  Procedure Laterality Date  . BACK SURGERY    . BASAL CELL CARCINOMA EXCISION Left    BCC left cheek  . BREAST BIOPSY Right 2011   +  . CARDIAC CATHETERIZATION  03/15/2005  . CATARACT EXTRACTION Left 2008  . FRACTURE SURGERY Left 05/17/2005   ORIF left distal radius fracture  . FRACTURE SURGERY Left 05/2005   left wrist  . FRACTURE SURGERY  04/2004   right wrist  . MASTECTOMY PARTIAL / LUMPECTOMY Right 2011   right breast    Allergies  Allergen Reactions  . Codeine Nausea And Vomiting  . Tetracyclines & Related Nausea And Vomiting  . Adhesive [Tape] Rash  . Keflex [Cephalexin] Rash  . Naproxen Rash    Patient was on both Keflex and Naproxen when developed rash -  unclear as to which caused rash    Allergies as of 02/23/2017      Reactions   Codeine Nausea And Vomiting   Tetracyclines & Related Nausea And Vomiting   Adhesive [tape] Rash   Keflex [cephalexin] Rash   Naproxen Rash      Medication List        Accurate as of 02/23/17 11:59 PM. Always use your most recent med list.  acetaminophen 325 MG tablet Commonly known as:  TYLENOL Take 2 tablets (650 mg total) by mouth every 6 (six) hours as needed for mild pain, moderate pain or headache (headache).   amLODipine 10 MG tablet Commonly known as:  NORVASC Take 1 tablet (10 mg total) by mouth daily.   buPROPion 150 MG 24 hr tablet Commonly known as:  WELLBUTRIN XL Take 150 mg by mouth daily.   cholecalciferol 1000 units tablet Commonly known as:  VITAMIN D Take 2,000 Units by mouth daily.   docusate sodium 100 MG capsule Commonly known as:  COLACE Take 1 capsule (100 mg total) by mouth 2 (two) times daily.   escitalopram 10 MG tablet Commonly known as:  LEXAPRO Take 10 mg by mouth daily.   hydrALAZINE 25 MG tablet Commonly known as:  APRESOLINE Take 1  tablet (25 mg total) by mouth 3 (three) times daily.   ipratropium-albuterol 0.5-2.5 (3) MG/3ML Soln Commonly known as:  DUONEB Take 3 mLs by nebulization 2 (two) times daily.   levothyroxine 100 MCG tablet Commonly known as:  SYNTHROID, LEVOTHROID Take 100 mcg by mouth daily before breakfast.   metoprolol tartrate 50 MG tablet Commonly known as:  LOPRESSOR Take 1 tablet (50 mg total) by mouth 2 (two) times daily.   OXYGEN Inhale 2 L into the lungs continuous.   polyethylene glycol packet Commonly known as:  MIRALAX / GLYCOLAX Take 17 g by mouth daily as needed for moderate constipation or severe constipation.   potassium chloride 10 MEQ CR capsule Commonly known as:  MICRO-K Take 10 mEq by mouth daily.   simvastatin 20 MG tablet Commonly known as:  ZOCOR Take 20 mg by mouth at bedtime.       Review of Systems  Constitutional: Negative for activity change, appetite change, chills, diaphoresis and fever.  HENT: Negative for congestion, mouth sores, nosebleeds, postnasal drip, sneezing, sore throat, trouble swallowing and voice change.   Respiratory: Positive for shortness of breath. Negative for apnea, cough, choking, chest tightness and wheezing.   Cardiovascular: Positive for leg swelling. Negative for chest pain and palpitations.  Gastrointestinal: Negative for abdominal distention, abdominal pain, constipation, diarrhea and nausea.  Genitourinary: Negative for difficulty urinating, dysuria, frequency and urgency.  Musculoskeletal: Positive for arthralgias (typical arthritis). Negative for back pain, gait problem and myalgias.  Skin: Negative for color change, pallor, rash and wound.  Neurological: Negative for dizziness, tremors, syncope, speech difficulty, weakness, numbness and headaches.  Psychiatric/Behavioral: Negative for agitation and behavioral problems.  All other systems reviewed and are negative.   Immunization History  Administered Date(s) Administered    . Influenza Split 12/30/2013, 12/08/2014  . Influenza-Unspecified 11/30/2012, 12/30/2013, 12/08/2014, 12/22/2016   Pertinent  Health Maintenance Due  Topic Date Due  . PNA vac Low Risk Adult (1 of 2 - PCV13) 11/06/1991  . INFLUENZA VACCINE  Completed  . DEXA SCAN  Completed   No flowsheet data found. Functional Status Survey:    Vitals:   02/23/17 1018  BP: 135/68  Pulse: 87  Resp: 20  Temp: 98.2 F (36.8 C)  TempSrc: Oral  SpO2: 97%  Weight: 212 lb 14.4 oz (96.6 kg)  Height: _0  (1.6 m)   Body mass index is 37.71 kg/m. Physical Exam  Constitutional: She is oriented to person, place, and time. Vital signs are normal. She appears well-developed and well-nourished. She is active and cooperative. She does not appear ill. No distress. Nasal cannula in place.  HENT:  Head: Normocephalic and atraumatic.  Mouth/Throat: Uvula is midline, oropharynx is clear and moist and mucous membranes are normal. Mucous membranes are not pale, not dry and not cyanotic.  Eyes: Conjunctivae, EOM and lids are normal. Pupils are equal, round, and reactive to light.  Neck: Trachea normal, normal range of motion and full passive range of motion without pain. Neck supple. No JVD present. No tracheal deviation, no edema and no erythema present. No thyromegaly present.  Cardiovascular: Normal rate, regular rhythm, normal heart sounds, intact distal pulses and normal pulses. Exam reveals no gallop, no distant heart sounds and no friction rub.  No murmur heard. Pulses:      Dorsalis pedis pulses are 2+ on the right side, and 2+ on the left side.  3+ BLE pitting edema  Pulmonary/Chest: Effort normal. No accessory muscle usage. No respiratory distress. She has decreased breath sounds in the left upper field, the left middle field and the left lower field. She has wheezes in the right upper field, the right middle field and the right lower field. She has no rhonchi. She has no rales. She exhibits no  tenderness.  Abdominal: Soft. Normal appearance and bowel sounds are normal. She exhibits no distension and no ascites. There is no tenderness.  Musculoskeletal: Normal range of motion. She exhibits no edema or tenderness.  Expected osteoarthritis, stiffness; Bilateral Calves soft, supple. Negative Homan's Sign. B- pedal pulses equal; generalized weakness  Neurological: She is alert and oriented to person, place, and time. She has normal strength.  Skin: Skin is warm, dry and intact. She is not diaphoretic. No cyanosis. No pallor. Nails show no clubbing.  Psychiatric: She has a normal mood and affect. Her speech is normal and behavior is normal. Judgment and thought content normal. Cognition and memory are normal.  Nursing note and vitals reviewed.   Labs reviewed: Recent Labs    01/11/17 2237 02/06/17 2000 02/07/17 0418  NA 134* 138 139  K 4.2 3.9 3.9  CL 99* 105 107  CO2 _0 GLUCOSE 128* 122* 96  BUN 27* 20 18  CREATININE 1.00 0.99 0.99  CALCIUM 9.3 9.0 8.5*   No results for input(s): AST, ALT, ALKPHOS, BILITOT, PROT, ALBUMIN in the last 8760 hours. Recent Labs    01/11/17 2237 02/06/17 2000 02/07/17 0418  WBC 10.4 9.3 6.5  NEUTROABS 8.6* 7.5*  --   HGB 13.7 13.3 11.6*  HCT 41.9 42.1 36.1  MCV 84.1 86.1 86.6  PLT 307 276 242   Lab Results  Component Value Date   TSH 2.370 09/08/2015   No results found for: HGBA1C No results found for: CHOL, HDL, LDLCALC, LDLDIRECT, TRIG, CHOLHDL  Significant Diagnostic Results in last 30 days:  Ct Head Wo Contrast  Result Date: 02/06/2017 CLINICAL DATA:  Fall.  Posterior scalp hematoma. EXAM: CT HEAD WITHOUT CONTRAST CT CERVICAL SPINE WITHOUT CONTRAST TECHNIQUE: Multidetector CT imaging of the head and cervical spine was performed following the standard protocol without intravenous contrast. Multiplanar CT image reconstructions of the cervical spine were also generated. COMPARISON:  None. FINDINGS: CT HEAD FINDINGS Brain: No  evidence of acute infarction, hemorrhage, hydrocephalus, extra-axial collection or mass lesion/mass effect. Cortical atrophy and mild periventricular small vessel disease present. Vascular: No hyperdense vessel or unexpected calcification. Skull: Normal. Negative for fracture or focal lesion. Sinuses/Orbits: No acute finding. Other: Focal scalp hemorrhage overlying the left posterior vertex. CT CERVICAL SPINE FINDINGS Alignment: Normal cervical spine alignment without evidence of subluxation. Skull base and vertebrae: No fracture or bone lesions.  Bones are osteopenic. Soft tissues and spinal canal: No soft tissue swelling, incidental masses or lymphadenopathy. The visualized airway is normally patent. Disc levels:  No significant degenerative disc disease identified. Upper chest: Negative. IMPRESSION: 1. Left posterior vertex scalp hematoma without evidence of intracranial hemorrhage or skull fracture. 2. No evidence of cervical spine injury. Electronically Signed   By: Aletta Edouard M.D.   On: 02/06/2017 18:25   Ct Angio Chest Pe W Or Wo Contrast  Result Date: 02/08/2017 CLINICAL DATA:  Recent fall, hypoxia, shortness of breath EXAM: CT ANGIOGRAPHY CHEST WITH CONTRAST TECHNIQUE: Multidetector CT imaging of the chest was performed using the standard protocol during bolus administration of intravenous contrast. Multiplanar CT image reconstructions and MIPs were obtained to evaluate the vascular anatomy. CONTRAST:  13m ISOVUE-370 IOPAMIDOL (ISOVUE-370) INJECTION 76% COMPARISON:  None. FINDINGS: Cardiovascular: Pulmonary arteries appear patent. No significant filling defect or pulmonary embolus demonstrated by CTA. Thoracic aortic atherosclerosis noted. Patent 3 vessel arch anatomy. No aneurysm or dissection. Mild cardiomegaly. Native coronary atherosclerosis noted. No pericardial effusion. Mediastinum/Nodes: Small hiatal hernia noted.  No adenopathy. Lungs/Pleura: Scattered bibasilar, right middle lobe, and  lingula atelectasis. No focal pneumonia, collapse or consolidation. No acute airspace process. Negative for edema or significant interstitial disease. No pleural abnormality, effusion, or pneumothorax. Trachea and central airways are patent. Upper Abdomen: Scattered small hepatic hypodensities, suspect small hepatic cysts. Chronic numerous bilateral renal cysts more on the right kidney, which are partially imaged by chest CTA. Abdominal atherosclerosis evident. Degenerative changes noted of the spine. Musculoskeletal: Bones are osteopenic. No acute compression fracture, wedge-shaped deformity or focal kyphosis. Sternum appears intact. Review of the MIP images confirms the above findings. IMPRESSION: Negative for significant acute pulmonary embolus by CTA. Bibasilar, right middle lobe, lingula scattered atelectasis. Small hiatal hernia Aortic Atherosclerosis (ICD10-I70.0). Electronically Signed   By: MJerilynn Mages  Shick M.D.   On: 02/08/2017 18:59   Ct Cervical Spine Wo Contrast  Result Date: 02/06/2017 CLINICAL DATA:  Fall.  Posterior scalp hematoma. EXAM: CT HEAD WITHOUT CONTRAST CT CERVICAL SPINE WITHOUT CONTRAST TECHNIQUE: Multidetector CT imaging of the head and cervical spine was performed following the standard protocol without intravenous contrast. Multiplanar CT image reconstructions of the cervical spine were also generated. COMPARISON:  None. FINDINGS: CT HEAD FINDINGS Brain: No evidence of acute infarction, hemorrhage, hydrocephalus, extra-axial collection or mass lesion/mass effect. Cortical atrophy and mild periventricular small vessel disease present. Vascular: No hyperdense vessel or unexpected calcification. Skull: Normal. Negative for fracture or focal lesion. Sinuses/Orbits: No acute finding. Other: Focal scalp hemorrhage overlying the left posterior vertex. CT CERVICAL SPINE FINDINGS Alignment: Normal cervical spine alignment without evidence of subluxation. Skull base and vertebrae: No fracture or  bone lesions. Bones are osteopenic. Soft tissues and spinal canal: No soft tissue swelling, incidental masses or lymphadenopathy. The visualized airway is normally patent. Disc levels:  No significant degenerative disc disease identified. Upper chest: Negative. IMPRESSION: 1. Left posterior vertex scalp hematoma without evidence of intracranial hemorrhage or skull fracture. 2. No evidence of cervical spine injury. Electronically Signed   By: GAletta EdouardM.D.   On: 02/06/2017 18:25   Dg Chest Port 1 View  Result Date: 02/06/2017 CLINICAL DATA:  Fall EXAM: PORTABLE CHEST 1 VIEW COMPARISON:  01/11/2017 FINDINGS: Subsegmental atelectasis or scarring at the lung bases. No consolidation or effusion. Mild cardiomegaly with aortic atherosclerosis. No pneumothorax. Old right third rib fracture. IMPRESSION: 1. Subsegmental atelectasis or scarring at the bases. Negative for pneumothorax or pleural effusion 2. Stable cardiomegaly  Electronically Signed   By: Donavan Foil M.D.   On: 02/06/2017 18:55   Dg Hand Complete Left  Result Date: 02/06/2017 CLINICAL DATA:  Fall, bruising to the left hand EXAM: LEFT HAND - COMPLETE 3+ VIEW COMPARISON:  None. FINDINGS: Surgical plate and screw fixation of the distal radius across old fracture deformity. Diffuse osteopenia. No acute displaced fracture or malalignment. Diffuse joint space narrowing at the DIP and PIP joints. Arthritis at the first [facility] and MCP joints. IMPRESSION: 1. No acute osseous abnormality 2. Osteopenia with diffuse degenerative changes. Electronically Signed   By: Donavan Foil M.D.   On: 02/06/2017 18:57   Dg Hip Unilat With Pelvis 2-3 Views Left  Result Date: 02/06/2017 CLINICAL DATA:  Fall today landing on left hip. EXAM: DG HIP (WITH OR WITHOUT PELVIS) 2-3V LEFT COMPARISON:  None. FINDINGS: Diffuse osteopenia. Mild symmetric degenerative change of the hips. No acute fracture or dislocation. There are degenerative changes of the spine. IMPRESSION:  No acute findings. Electronically Signed   By: Marin Olp M.D.   On: 02/06/2017 18:08    Assessment/Plan Peripheral edema  2-V CXR  Lasix 40 mg IM x 1 now  Torsemide 10 mg po Q Day  TED hose- on in the early am, off in the pm  Met B 1 week  Family/ staff Communication:   Total Time:  Documentation:  Face to Face:  Family/Phone:   Labs/tests ordered:  Cbc, met c  Medication list reviewed and assessed for continued appropriateness.  Vikki Ports, NP-C Geriatrics [facility] [phone], [place] [address] Cell Phone (Mon-Fri 8am-5pm):  [phone] On Call:  [phone] & follow prompts after 5pm & weekends Office Phone:  [phone] Office Fax:  [phone]

## 2017-02-28 ENCOUNTER — Other Ambulatory Visit
Admission: RE | Admit: 2017-02-28 | Discharge: 2017-02-28 | Disposition: A | Discharge status: Home / Self Care | Payer: Medicare Other | Source: Skilled Nursing Facility | Attending: Gerontology | Admitting: Gerontology

## 2017-02-28 DIAGNOSIS — I10 Essential (primary) hypertension: Secondary | ICD-10-CM | POA: Diagnosis present

## 2017-02-28 LAB — COMPREHENSIVE METABOLIC PANEL
ALBUMIN: 2.9 g/dL — AB (ref 3.5–5.0)
ALK PHOS: 61 U/L (ref 38–126)
ALT: 12 U/L — ABNORMAL LOW (ref 14–54)
ANION GAP: 13 (ref 5–15)
AST: 25 U/L (ref 15–41)
BUN: 25 mg/dL — ABNORMAL HIGH (ref 6–20)
CHLORIDE: 99 mmol/L — AB (ref 101–111)
CO2: 29 mmol/L (ref 22–32)
Calcium: 8.9 mg/dL (ref 8.9–10.3)
Creatinine, Ser: 1.11 mg/dL — ABNORMAL HIGH (ref 0.44–1.00)
GFR calc non Af Amer: 42 mL/min — ABNORMAL LOW (ref >60)
GFR, EST AFRICAN AMERICAN: 49 mL/min — AB (ref >60)
GLUCOSE: 61 mg/dL — AB (ref 65–99)
Potassium: 4.1 mmol/L (ref 3.5–5.1)
SODIUM: 141 mmol/L (ref 135–145)
Total Bilirubin: 0.7 mg/dL (ref 0.3–1.2)
Total Protein: 6.4 g/dL — ABNORMAL LOW (ref 6.5–8.1)

## 2017-02-28 LAB — CBC WITH DIFFERENTIAL/PLATELET
Basophils Absolute: 0.1 10*3/uL (ref 0–0.1)
Basophils Relative: 1 %
Eosinophils Absolute: 0.3 10*3/uL (ref 0–0.7)
Eosinophils Relative: 3 %
HEMATOCRIT: 35.8 % (ref 35.0–47.0)
HEMOGLOBIN: 11.3 g/dL — AB (ref 12.0–16.0)
LYMPHS ABS: 1.5 10*3/uL (ref 1.0–3.6)
LYMPHS PCT: 15 %
MCH: 27.8 pg (ref 26.0–34.0)
MCHC: 31.6 g/dL — AB (ref 32.0–36.0)
MCV: 88 fL (ref 80.0–100.0)
MONO ABS: 0.9 10*3/uL (ref 0.2–0.9)
MONOS PCT: 9 %
NEUTROS ABS: 7.7 10*3/uL — AB (ref 1.4–6.5)
NEUTROS PCT: 72 %
Platelets: 281 10*3/uL (ref 150–440)
RBC: 4.06 MIL/uL (ref 3.80–5.20)
RDW: 17 % — AB (ref 11.5–14.5)
WBC: 10.6 10*3/uL (ref 3.6–11.0)

## 2017-03-03 ENCOUNTER — Non-Acute Institutional Stay (SKILLED_NURSING_FACILITY): Payer: Medicare Other | Admitting: Gerontology

## 2017-03-03 DIAGNOSIS — R531 Weakness: Secondary | ICD-10-CM | POA: Diagnosis not present

## 2017-03-03 DIAGNOSIS — R0902 Hypoxemia: Secondary | ICD-10-CM

## 2017-03-06 ENCOUNTER — Other Ambulatory Visit
Admission: RE | Admit: 2017-03-06 | Discharge: 2017-03-06 | Disposition: A | Discharge status: Home / Self Care | Payer: Medicare Other | Source: Ambulatory Visit | Attending: Gerontology | Admitting: Gerontology

## 2017-03-06 DIAGNOSIS — I10 Essential (primary) hypertension: Secondary | ICD-10-CM | POA: Insufficient documentation

## 2017-03-06 DIAGNOSIS — R609 Edema, unspecified: Secondary | ICD-10-CM | POA: Insufficient documentation

## 2017-03-06 DIAGNOSIS — R531 Weakness: Secondary | ICD-10-CM | POA: Insufficient documentation

## 2017-03-06 LAB — COMPREHENSIVE METABOLIC PANEL
ALBUMIN: 3 g/dL — AB (ref 3.5–5.0)
ALK PHOS: 60 U/L (ref 38–126)
ALT: 11 U/L — AB (ref 14–54)
AST: 17 U/L (ref 15–41)
Anion gap: 10 (ref 5–15)
BILIRUBIN TOTAL: 0.3 mg/dL (ref 0.3–1.2)
BUN: 29 mg/dL — AB (ref 6–20)
CALCIUM: 8.8 mg/dL — AB (ref 8.9–10.3)
CO2: 30 mmol/L (ref 22–32)
CREATININE: 1.25 mg/dL — AB (ref 0.44–1.00)
Chloride: 99 mmol/L — ABNORMAL LOW (ref 101–111)
GFR calc Af Amer: 43 mL/min — ABNORMAL LOW (ref >60)
GFR calc non Af Amer: 37 mL/min — ABNORMAL LOW (ref >60)
GLUCOSE: 88 mg/dL (ref 65–99)
Potassium: 4 mmol/L (ref 3.5–5.1)
SODIUM: 139 mmol/L (ref 135–145)
TOTAL PROTEIN: 6.5 g/dL (ref 6.5–8.1)

## 2017-03-06 LAB — CBC WITH DIFFERENTIAL/PLATELET
Basophils Absolute: 0.1 10*3/uL (ref 0–0.1)
Basophils Relative: 1 %
EOS ABS: 0.3 10*3/uL (ref 0–0.7)
Eosinophils Relative: 4 %
HEMATOCRIT: 34.9 % — AB (ref 35.0–47.0)
HEMOGLOBIN: 11.1 g/dL — AB (ref 12.0–16.0)
Lymphocytes Relative: 16 %
Lymphs Abs: 1.2 10*3/uL (ref 1.0–3.6)
MCH: 27.9 pg (ref 26.0–34.0)
MCHC: 31.8 g/dL — AB (ref 32.0–36.0)
MCV: 87.7 fL (ref 80.0–100.0)
MONOS PCT: 14 %
Monocytes Absolute: 1.1 10*3/uL — ABNORMAL HIGH (ref 0.2–0.9)
NEUTROS ABS: 4.9 10*3/uL (ref 1.4–6.5)
NEUTROS PCT: 65 %
Platelets: 289 10*3/uL (ref 150–440)
RBC: 3.98 MIL/uL (ref 3.80–5.20)
RDW: 16.3 % — ABNORMAL HIGH (ref 11.5–14.5)
WBC: 7.6 10*3/uL (ref 3.6–11.0)

## 2017-03-06 NOTE — Progress Notes (Signed)
Location:      Place of Service:  SNF (31) Provider:  Toni Arthurs, NP-C  [name], MD  Patient Care Team: [name], MD as PCP - General [facility])  Extended Emergency Contact Information Primary Emergency Contact: Andrea Schaefer,Andrea Schaefer Address: [place], [place] [address] [place] Phone: [phone] Work Phone: [phone] Mobile Phone: [phone] Relation: Son Secondary Emergency Contact: Thurston Pounds Mobile Phone: [phone] Relation: Niece Interpreter needed? No  Code Status:  FULL Goals of care: Advanced Directive information Advanced Directives 02/23/2017  Does Patient Have a Medical Advance Directive? No  Type of Advance Directive -  Does patient want to make changes to medical advance directive? -  Copy of [place] in Chart? -  Would patient like information on creating a medical advance directive? -     Chief Complaint  Patient presents with  . Medical Management of Chronic Issues    Routine visit    HPI:  Pt is a 84 y.o. female seen today for medical management of chronic diseases. Pt was admitted to the facility for rehab following hospitalization for frequent falls at home and hypoxia. Pt is on O2 2L - maintaining O2 sats in mid 90s. Pt denies cough, congestion. Denies chest pain or shortness of breath. Oxygen was left off for an unknown amount of time this am. O2 sats decreased to 83% while sitting still. When O2 re-applied, sats increased to 91%. Pt verbalized understanding of importance of keeping oxygen on. Pt has been working with PT and OT. Improved mobility with rolling walker. Pt denies dizziness/ light-headedness, presyncope, etc. Pt reports her appetite is good. She is voiding well and having regular BMs. Other than O2 sats, VSS. No other complaints.      Past Medical History:  Diagnosis Date  . Anxiety    unspecified  . Arthritis   . Cancer [facility]) 2011   Right  . Cataract  cortical, senile   . Depression   . Gout   . History of breast cancer   . History of poliomyelitis    as a child  . History of rosacea   . Hyperlipidemia, unspecified   . Hypertension   . Hypothyroidism    unspecified  . Osteoporosis, post-menopausal   . Pericarditis   . Personal history of radiation therapy   . Renal insufficiency   . Seborrheic dermatitis   . Thyroid disease    Past Surgical History:  Procedure Laterality Date  . BACK SURGERY    . BASAL CELL CARCINOMA EXCISION Left    BCC left cheek  . BREAST BIOPSY Right 2011   +  . CARDIAC CATHETERIZATION  03/15/2005  . CATARACT EXTRACTION Left 2008  . FRACTURE SURGERY Left 05/17/2005   ORIF left distal radius fracture  . FRACTURE SURGERY Left 05/2005   left wrist  . FRACTURE SURGERY  04/2004   right wrist  . MASTECTOMY PARTIAL / LUMPECTOMY Right 2011   right breast    Allergies  Allergen Reactions  . Codeine Nausea And Vomiting  . Tetracyclines & Related Nausea And Vomiting  . Adhesive [Tape] Rash  . Keflex [Cephalexin] Rash  . Naproxen Rash    Patient was on both Keflex and Naproxen when developed rash -  unclear as to which caused rash    Allergies as of 03/03/2017      Reactions   Codeine Nausea And Vomiting   Tetracyclines & Related Nausea And Vomiting  Adhesive [tape] Rash   Keflex [cephalexin] Rash   Naproxen Rash   Patient was on both Keflex and Naproxen when developed rash -  unclear as to which caused rash      Medication List        Accurate as of 03/03/17 11:59 PM. Always use your most recent med list.          acetaminophen 325 MG tablet Commonly known as:  TYLENOL Take 2 tablets (650 mg total) by mouth every 6 (six) hours as needed for mild pain, moderate pain or headache (headache).   amLODipine 10 MG tablet Commonly known as:  NORVASC Take 1 tablet (10 mg total) by mouth daily.   buPROPion 150 MG 24 hr tablet Commonly known as:  WELLBUTRIN XL Take 150 mg by mouth daily.     cholecalciferol 1000 units tablet Commonly known as:  VITAMIN D Take 2,000 Units by mouth daily.   docusate sodium 100 MG capsule Commonly known as:  COLACE Take 1 capsule (100 mg total) by mouth 2 (two) times daily.   escitalopram 10 MG tablet Commonly known as:  LEXAPRO Take 10 mg by mouth daily.   hydrALAZINE 25 MG tablet Commonly known as:  APRESOLINE Take 1 tablet (25 mg total) by mouth 3 (three) times daily.   ipratropium-albuterol 0.5-2.5 (3) MG/3ML Soln Commonly known as:  DUONEB Take 3 mLs by nebulization 2 (two) times daily.   levothyroxine 100 MCG tablet Commonly known as:  SYNTHROID, LEVOTHROID Take 100 mcg by mouth daily before breakfast.   metoprolol tartrate 50 MG tablet Commonly known as:  LOPRESSOR Take 1 tablet (50 mg total) by mouth 2 (two) times daily.   OXYGEN Inhale 2 L into the lungs continuous.   polyethylene glycol packet Commonly known as:  MIRALAX / GLYCOLAX Take 17 g by mouth daily as needed for moderate constipation or severe constipation.   potassium chloride 10 MEQ CR capsule Commonly known as:  MICRO-K Take 10 mEq by mouth daily.   simvastatin 20 MG tablet Commonly known as:  ZOCOR Take 20 mg by mouth at bedtime.       Review of Systems  Immunization History  Administered Date(s) Administered  . Influenza Split 12/30/2013, 12/08/2014  . Influenza-Unspecified 11/30/2012, 12/30/2013, 12/08/2014, 12/22/2016   Pertinent  Health Maintenance Due  Topic Date Due  . PNA vac Low Risk Adult (1 of 2 - PCV13) 11/06/1991  . INFLUENZA VACCINE  Completed  . DEXA SCAN  Completed   No flowsheet data found. Functional Status Survey:    Vitals:   03/03/17 1130  BP: (!) 120/51  Pulse: 88  Resp: 20  Temp: 98.6 F (37 C)  SpO2: 94%   There is no height or weight on file to calculate BMI. Physical Exam  Labs reviewed: Recent Labs    02/06/17 2000 02/07/17 0418 02/28/17 0430  NA 138 139 141  K 3.9 3.9 4.1  CL 105 107 99*   CO2 26 27 29   GLUCOSE 122* 96 61*  BUN 20 18 25*  CREATININE 0.99 0.99 1.11*  CALCIUM 9.0 8.5* 8.9   Recent Labs    02/28/17 0430  AST 25  ALT 12*  ALKPHOS 61  BILITOT 0.7  PROT 6.4*  ALBUMIN 2.9*   Recent Labs    01/11/17 2237 02/06/17 2000 02/07/17 0418 02/28/17 0430  WBC 10.4 9.3 6.5 10.6  NEUTROABS 8.6* 7.5*  --  7.7*  HGB 13.7 13.3 11.6* 11.3*  HCT 41.9 42.1 36.1 35.8  MCV 84.1 86.1 86.6 88.0  PLT 307 276 242 281   Lab Results  Component Value Date   TSH 2.370 09/08/2015   No results found for: HGBA1C No results found for: CHOL, HDL, LDLCALC, LDLDIRECT, TRIG, CHOLHDL  Significant Diagnostic Results in last 30 days:  Ct Head Wo Contrast  Result Date: 02/06/2017 CLINICAL DATA:  Fall.  Posterior scalp hematoma. EXAM: CT HEAD WITHOUT CONTRAST CT CERVICAL SPINE WITHOUT CONTRAST TECHNIQUE: Multidetector CT imaging of the head and cervical spine was performed following the standard protocol without intravenous contrast. Multiplanar CT image reconstructions of the cervical spine were also generated. COMPARISON:  None. FINDINGS: CT HEAD FINDINGS Brain: No evidence of acute infarction, hemorrhage, hydrocephalus, extra-axial collection or mass lesion/mass effect. Cortical atrophy and mild periventricular small vessel disease present. Vascular: No hyperdense vessel or unexpected calcification. Skull: Normal. Negative for fracture or focal lesion. Sinuses/Orbits: No acute finding. Other: Focal scalp hemorrhage overlying the left posterior vertex. CT CERVICAL SPINE FINDINGS Alignment: Normal cervical spine alignment without evidence of subluxation. Skull base and vertebrae: No fracture or bone lesions. Bones are osteopenic. Soft tissues and spinal canal: No soft tissue swelling, incidental masses or lymphadenopathy. The visualized airway is normally patent. Disc levels:  No significant degenerative disc disease identified. Upper chest: Negative. IMPRESSION: 1. Left posterior vertex  scalp hematoma without evidence of intracranial hemorrhage or skull fracture. 2. No evidence of cervical spine injury. Electronically Signed   By: Aletta Edouard M.D.   On: 02/06/2017 18:25   Ct Angio Chest Pe W Or Wo Contrast  Result Date: 02/08/2017 CLINICAL DATA:  Recent fall, hypoxia, shortness of breath EXAM: CT ANGIOGRAPHY CHEST WITH CONTRAST TECHNIQUE: Multidetector CT imaging of the chest was performed using the standard protocol during bolus administration of intravenous contrast. Multiplanar CT image reconstructions and MIPs were obtained to evaluate the vascular anatomy. CONTRAST:  61mL ISOVUE-370 IOPAMIDOL (ISOVUE-370) INJECTION 76% COMPARISON:  None. FINDINGS: Cardiovascular: Pulmonary arteries appear patent. No significant filling defect or pulmonary embolus demonstrated by CTA. Thoracic aortic atherosclerosis noted. Patent 3 vessel arch anatomy. No aneurysm or dissection. Mild cardiomegaly. Native coronary atherosclerosis noted. No pericardial effusion. Mediastinum/Nodes: Small hiatal hernia noted.  No adenopathy. Lungs/Pleura: Scattered bibasilar, right middle lobe, and lingula atelectasis. No focal pneumonia, collapse or consolidation. No acute airspace process. Negative for edema or significant interstitial disease. No pleural abnormality, effusion, or pneumothorax. Trachea and central airways are patent. Upper Abdomen: Scattered small hepatic hypodensities, suspect small hepatic cysts. Chronic numerous bilateral renal cysts more on the right kidney, which are partially imaged by chest CTA. Abdominal atherosclerosis evident. Degenerative changes noted of the spine. Musculoskeletal: Bones are osteopenic. No acute compression fracture, wedge-shaped deformity or focal kyphosis. Sternum appears intact. Review of the MIP images confirms the above findings. IMPRESSION: Negative for significant acute pulmonary embolus by CTA. Bibasilar, right middle lobe, lingula scattered atelectasis. Small hiatal  hernia Aortic Atherosclerosis (ICD10-I70.0). Electronically Signed   By: Jerilynn Mages.  Shick M.D.   On: 02/08/2017 18:59   Ct Cervical Spine Wo Contrast  Result Date: 02/06/2017 CLINICAL DATA:  Fall.  Posterior scalp hematoma. EXAM: CT HEAD WITHOUT CONTRAST CT CERVICAL SPINE WITHOUT CONTRAST TECHNIQUE: Multidetector CT imaging of the head and cervical spine was performed following the standard protocol without intravenous contrast. Multiplanar CT image reconstructions of the cervical spine were also generated. COMPARISON:  None. FINDINGS: CT HEAD FINDINGS Brain: No evidence of acute infarction, hemorrhage, hydrocephalus, extra-axial collection or mass lesion/mass effect. Cortical atrophy and mild periventricular small vessel disease present.  Vascular: No hyperdense vessel or unexpected calcification. Skull: Normal. Negative for fracture or focal lesion. Sinuses/Orbits: No acute finding. Other: Focal scalp hemorrhage overlying the left posterior vertex. CT CERVICAL SPINE FINDINGS Alignment: Normal cervical spine alignment without evidence of subluxation. Skull base and vertebrae: No fracture or bone lesions. Bones are osteopenic. Soft tissues and spinal canal: No soft tissue swelling, incidental masses or lymphadenopathy. The visualized airway is normally patent. Disc levels:  No significant degenerative disc disease identified. Upper chest: Negative. IMPRESSION: 1. Left posterior vertex scalp hematoma without evidence of intracranial hemorrhage or skull fracture. 2. No evidence of cervical spine injury. Electronically Signed   By: Aletta Edouard M.D.   On: 02/06/2017 18:25   Dg Chest Port 1 View  Result Date: 02/06/2017 CLINICAL DATA:  Fall EXAM: PORTABLE CHEST 1 VIEW COMPARISON:  01/11/2017 FINDINGS: Subsegmental atelectasis or scarring at the lung bases. No consolidation or effusion. Mild cardiomegaly with aortic atherosclerosis. No pneumothorax. Old right third rib fracture. IMPRESSION: 1. Subsegmental  atelectasis or scarring at the bases. Negative for pneumothorax or pleural effusion 2. Stable cardiomegaly Electronically Signed   By: Donavan Foil M.D.   On: 02/06/2017 18:55   Dg Hand Complete Left  Result Date: 02/06/2017 CLINICAL DATA:  Fall, bruising to the left hand EXAM: LEFT HAND - COMPLETE 3+ VIEW COMPARISON:  None. FINDINGS: Surgical plate and screw fixation of the distal radius across old fracture deformity. Diffuse osteopenia. No acute displaced fracture or malalignment. Diffuse joint space narrowing at the DIP and PIP joints. Arthritis at the first [facility] and MCP joints. IMPRESSION: 1. No acute osseous abnormality 2. Osteopenia with diffuse degenerative changes. Electronically Signed   By: Donavan Foil M.D.   On: 02/06/2017 18:57   Dg Hip Unilat With Pelvis 2-3 Views Left  Result Date: 02/06/2017 CLINICAL DATA:  Fall today landing on left hip. EXAM: DG HIP (WITH OR WITHOUT PELVIS) 2-3V LEFT COMPARISON:  None. FINDINGS: Diffuse osteopenia. Mild symmetric degenerative change of the hips. No acute fracture or dislocation. There are degenerative changes of the spine. IMPRESSION: No acute findings. Electronically Signed   By: Marin Olp M.D.   On: 02/06/2017 18:08    Assessment/Plan Tamela was seen today for medical management of chronic issues.  Diagnoses and all orders for this visit:  Hypoxia  Improved, but unable to wean oxygen  O2 2L [place] continuous  Encourage IS/TCDB  Generalized weakness  Improved  Continue working with PT/OT  Continue exercises as taught by PT/OT  Ambulate with RW with O2  Safety precautions   Family/ staff Communication:   Total Time:  Documentation:  Face to Face:  Family/Phone:   Labs/tests ordered:    Medication list reviewed and assessed for continued appropriateness. Monthly medication orders reviewed and signed.  Oxygen Qualification  [name]        Duration: 100 feet ambulation   Oxygen Testing During  Exercise  1. Room Air Resting Pulse Oximetry: 83%  2. Room Air Pulse Oximetry during exercise: 81% with dyspnea and tachycardia  3. Pulse Oximetry on LPM O2 via Nasal Cannula during exercise: 91%  Other Modalities Attempted Without Satisfactory Resolution of Symptoms  1. Duonebs scheduled BID  2. Diuresed with Lasix and Torsemide; use of Incentive Spirometer  Orders  1. Please arrange for home oxygen delivery system of choice upon discharge from facility   2. O2 at 2 liters/ minute via [place] for support and prevention of decompensation    ___________________________  03/06/2017      _____________________    Physician Signature                                                             Date  NPI#: [id number]    Vikki Ports, NP-C [facility] [phone], [place] [address] Cell Phone (Mon-Fri 8am-5pm):  [phone] On Call:  [phone] & follow prompts after 5pm & weekends Office Phone:  [phone] Office Fax:  [phone]

## 2017-03-07 ENCOUNTER — Non-Acute Institutional Stay (SKILLED_NURSING_FACILITY): Payer: Medicare Other | Admitting: Gerontology

## 2017-03-07 DIAGNOSIS — J449 Chronic obstructive pulmonary disease, unspecified: Secondary | ICD-10-CM | POA: Insufficient documentation

## 2017-03-07 DIAGNOSIS — R609 Edema, unspecified: Secondary | ICD-10-CM | POA: Diagnosis not present

## 2017-03-07 DIAGNOSIS — R0902 Hypoxemia: Secondary | ICD-10-CM

## 2017-03-07 NOTE — Assessment & Plan Note (Signed)
Patient was admitted to the facility for rehab for generalized weakness and deconditioning after hospitalization for falls and hypoxia.  Chart review done on previous notes/office visits.  A documented oxygen saturation of 84% was noted in June 2018.  Subsequent oxygen saturation levels that are documented have been subtherapeutic.  Patient has had multiple bouts of pneumonia.  At least 3 in the past year.  Yesterday, patient was found to be 83% on room air and was symptomatic with tachycardia and dyspnea.  Today during interview, patient became dyspneic with answering questions.  Patient denies chest pain.

## 2017-03-07 NOTE — Assessment & Plan Note (Signed)
Patient continues with 2+ BLE edema.  Patient typically refuses to wear TED hose.  Patient denies pain in the feet and legs.  Encouraged patient to keep legs elevated when at rest.

## 2017-03-07 NOTE — Progress Notes (Signed)
Location:      Place of Service:  SNF (31)  Provider: Toni Arthurs, NP-C  PCP: [name], MD Patient Care Team: [name], MD as PCP - General [facility] Medicine)  Extended Emergency Contact Information Primary Emergency Contact: Mattos,James Address: [address], [place] [address] [place] Phone: [phone] Work Phone: [phone] Mobile Phone: [phone] Relation: Son Secondary Emergency Contact: Thurston Pounds Mobile Phone: [phone] Relation: Niece Interpreter needed? No  Code Status:FULL Goals of care:  Advanced Directive information Advanced Directives 02/23/2017  Does Patient Have a Medical Advance Directive? No  Type of Advance Directive -  Does patient want to make changes to medical advance directive? -  Copy of [place] in Chart? -  Would patient like information on creating a medical advance directive? -     Allergies  Allergen Reactions  . Codeine Nausea And Vomiting  . Tetracyclines & Related Nausea And Vomiting  . Adhesive [Tape] Rash  . Keflex [Cephalexin] Rash  . Naproxen Rash    Patient was on both Keflex and Naproxen when developed rash -  unclear as to which caused rash    Chief Complaint  Patient presents with  . Discharge Note    HPI:  84 y.o. female      Past Medical History:  Diagnosis Date  . Anxiety    unspecified  . Arthritis   . Cancer [facility]) 2011   Right  . Cataract cortical, senile   . Depression   . Gout   . History of breast cancer   . History of poliomyelitis    as a child  . History of rosacea   . Hyperlipidemia, unspecified   . Hypertension   . Hypothyroidism    unspecified  . Osteoporosis, post-menopausal   . Pericarditis   . Personal history of radiation therapy   . Renal insufficiency   . Seborrheic dermatitis   . Thyroid disease     Past Surgical History:  Procedure Laterality Date  . BACK SURGERY    . BASAL CELL CARCINOMA EXCISION Left    BCC left cheek  . BREAST BIOPSY Right 2011   +  . CARDIAC CATHETERIZATION  03/15/2005  . CATARACT EXTRACTION Left 2008  . FRACTURE SURGERY Left 05/17/2005   ORIF left distal radius fracture  . FRACTURE SURGERY Left 05/2005   left wrist  . FRACTURE SURGERY  04/2004   right wrist  . MASTECTOMY PARTIAL / LUMPECTOMY Right 2011   right breast      reports that  has never smoked. she has never used smokeless tobacco. She reports that she does not drink alcohol or use drugs. Social History   Socioeconomic History  . Marital status: Widowed    Spouse name: Not on file  . Number of children: 2  . Years of education: 36  . Highest education level: High school graduate  Social Needs  . Financial resource strain: Not on file  . Food insecurity - worry: Not on file  . Food insecurity - inability: Not on file  . Transportation needs - medical: Not on file  . Transportation needs - non-medical: Not on file  Occupational History  . Not on file  Tobacco Use  . Smoking status: Never Smoker  . Smokeless tobacco: Never Used  Substance and Sexual Activity  . Alcohol use: No  . Drug use: No  . Sexual activity: Not on file  Other Topics Concern  .  Not on file  Social History Narrative   Full Code   Widowed   Never smoker   No alcohol use   No smokeless tobacco use   2 children   Functional Status Survey:    Allergies  Allergen Reactions  . Codeine Nausea And Vomiting  . Tetracyclines & Related Nausea And Vomiting  . Adhesive [Tape] Rash  . Keflex [Cephalexin] Rash  . Naproxen Rash    Patient was on both Keflex and Naproxen when developed rash -  unclear as to which caused rash    Pertinent  Health Maintenance Due  Topic Date Due  . PNA vac Low Risk Adult (1 of 2 - PCV13) 11/06/1991  . INFLUENZA VACCINE  Completed  . DEXA SCAN  Completed    Medications: Allergies as of 03/07/2017      Reactions   Codeine Nausea And Vomiting   Tetracyclines & Related Nausea And Vomiting    Adhesive [tape] Rash   Keflex [cephalexin] Rash   Naproxen Rash   Patient was on both Keflex and Naproxen when developed rash -  unclear as to which caused rash      Medication List        Accurate as of 03/07/17  4:50 PM. Always use your most recent med list.          acetaminophen 325 MG tablet Commonly known as:  TYLENOL Take 2 tablets (650 mg total) by mouth every 6 (six) hours as needed for mild pain, moderate pain or headache (headache).   amLODipine 10 MG tablet Commonly known as:  NORVASC Take 1 tablet (10 mg total) by mouth daily.   buPROPion 150 MG 24 hr tablet Commonly known as:  WELLBUTRIN XL Take 150 mg by mouth daily.   cholecalciferol 1000 units tablet Commonly known as:  VITAMIN D Take 2,000 Units by mouth daily.   docusate sodium 100 MG capsule Commonly known as:  COLACE Take 1 capsule (100 mg total) by mouth 2 (two) times daily.   escitalopram 10 MG tablet Commonly known as:  LEXAPRO Take 10 mg by mouth daily.   hydrALAZINE 25 MG tablet Commonly known as:  APRESOLINE Take 1 tablet (25 mg total) by mouth 3 (three) times daily.   ipratropium-albuterol 0.5-2.5 (3) MG/3ML Soln Commonly known as:  DUONEB Take 3 mLs by nebulization 2 (two) times daily.   levothyroxine 100 MCG tablet Commonly known as:  SYNTHROID, LEVOTHROID Take 100 mcg by mouth daily before breakfast.   metoprolol tartrate 50 MG tablet Commonly known as:  LOPRESSOR Take 1 tablet (50 mg total) by mouth 2 (two) times daily.   OXYGEN Inhale 2 L into the lungs continuous.   polyethylene glycol packet Commonly known as:  MIRALAX / GLYCOLAX Take 17 g by mouth daily as needed for moderate constipation or severe constipation.   potassium chloride 10 MEQ CR capsule Commonly known as:  MICRO-K Take 10 mEq by mouth daily.   simvastatin 20 MG tablet Commonly known as:  ZOCOR Take 20 mg by mouth at bedtime.       Review of Systems  Constitutional: Negative for activity  change, appetite change, chills, diaphoresis and fever.  HENT: Negative for congestion, mouth sores, nosebleeds, postnasal drip, sneezing, sore throat, trouble swallowing and voice change.   Respiratory: Positive for shortness of breath. Negative for apnea, cough, choking, chest tightness and wheezing.   Cardiovascular: Positive for leg swelling. Negative for chest pain and palpitations.  Gastrointestinal: Negative for abdominal distention, abdominal pain,  constipation, diarrhea and nausea.  Genitourinary: Negative for difficulty urinating, dysuria, frequency and urgency.  Musculoskeletal: Positive for arthralgias (typical arthritis). Negative for back pain, gait problem and myalgias.  Skin: Negative for color change, pallor, rash and wound.  Neurological: Negative for dizziness, tremors, syncope, speech difficulty, weakness, numbness and headaches.  Psychiatric/Behavioral: Negative for agitation and behavioral problems.  All other systems reviewed and are negative.   Vitals:   03/07/17 0445  BP: 129/64  Pulse: 76  Resp: (!) 95  Temp: 97.6 F (36.4 C)  Weight: 210 lb 6.4 oz (95.4 kg)  HC: 20" (50.8 cm)   Body mass index is 37.27 kg/m. Physical Exam  Constitutional: She is oriented to person, place, and time. Vital signs are normal. She appears well-developed and well-nourished. She is active and cooperative. She does not appear ill. No distress. Nasal cannula in place.  HENT:  Head: Normocephalic and atraumatic.  Mouth/Throat: Uvula is midline, oropharynx is clear and moist and mucous membranes are normal. Mucous membranes are not pale, not dry and not cyanotic.  Eyes: Conjunctivae, EOM and lids are normal. Pupils are equal, round, and reactive to light.  Neck: Trachea normal, normal range of motion and full passive range of motion without pain. Neck supple. No JVD present. No tracheal deviation, no edema and no erythema present. No thyromegaly present.  Cardiovascular: Normal rate,  regular rhythm, normal heart sounds, intact distal pulses and normal pulses. Exam reveals no gallop, no distant heart sounds and no friction rub.  No murmur heard. Pulses:      Dorsalis pedis pulses are 2+ on the right side, and 2+ on the left side.  2+ BLE pitting edema  Pulmonary/Chest: Effort normal. No accessory muscle usage. No respiratory distress. She has decreased breath sounds in the right lower field and the left lower field. She has no wheezes. She has no rhonchi. She has no rales. She exhibits no tenderness.  Dyspnea when talking  Abdominal: Soft. Normal appearance and bowel sounds are normal. She exhibits no distension and no ascites. There is no tenderness.  Musculoskeletal: Normal range of motion. She exhibits no edema or tenderness.  Expected osteoarthritis, stiffness; Bilateral Calves soft, supple. Negative Homan's Sign. B- pedal pulses equal; generalized weakness  Neurological: She is alert and oriented to person, place, and time. She has normal strength.  Skin: Skin is warm, dry and intact. She is not diaphoretic. No cyanosis. No pallor. Nails show no clubbing.  Psychiatric: She has a normal mood and affect. Her speech is normal and behavior is normal. Judgment and thought content normal. Cognition and memory are normal.  Nursing note and vitals reviewed.   Labs reviewed: Basic Metabolic Panel: Recent Labs    02/07/17 0418 02/28/17 0430 03/06/17 0400  NA 139 141 139  K 3.9 4.1 4.0  CL 107 99* 99*  CO2 '27 29 30  '$ GLUCOSE 96 61* 88  BUN 18 25* 29*  CREATININE 0.99 1.11* 1.25*  CALCIUM 8.5* 8.9 8.8*   Liver Function Tests: Recent Labs    02/28/17 0430 03/06/17 0400  AST 25 17  ALT 12* 11*  ALKPHOS 61 60  BILITOT 0.7 0.3  PROT 6.4* 6.5  ALBUMIN 2.9* 3.0*   No results for input(s): LIPASE, AMYLASE in the last 8760 hours. No results for input(s): AMMONIA in the last 8760 hours. CBC: Recent Labs    02/06/17 2000 02/07/17 0418 02/28/17 0430 03/06/17 0400    WBC 9.3 6.5 10.6 7.6  NEUTROABS 7.5*  --  7.7* 4.9  HGB 13.3 11.6* 11.3* 11.1*  HCT 42.1 36.1 35.8 34.9*  MCV 86.1 86.6 88.0 87.7  PLT 276 242 281 289   Cardiac Enzymes: Recent Labs    01/11/17 2237 02/06/17 2000  TROPONINI <0.03 <0.03   BNP: Invalid input(s): POCBNP CBG: Recent Labs    02/09/17 0751 02/10/17 0739  GLUCAP 103* 122*    Procedures and Imaging Studies During Stay: Ct Head Wo Contrast  Result Date: 02/06/2017 CLINICAL DATA:  Fall.  Posterior scalp hematoma. EXAM: CT HEAD WITHOUT CONTRAST CT CERVICAL SPINE WITHOUT CONTRAST TECHNIQUE: Multidetector CT imaging of the head and cervical spine was performed following the standard protocol without intravenous contrast. Multiplanar CT image reconstructions of the cervical spine were also generated. COMPARISON:  None. FINDINGS: CT HEAD FINDINGS Brain: No evidence of acute infarction, hemorrhage, hydrocephalus, extra-axial collection or mass lesion/mass effect. Cortical atrophy and mild periventricular small vessel disease present. Vascular: No hyperdense vessel or unexpected calcification. Skull: Normal. Negative for fracture or focal lesion. Sinuses/Orbits: No acute finding. Other: Focal scalp hemorrhage overlying the left posterior vertex. CT CERVICAL SPINE FINDINGS Alignment: Normal cervical spine alignment without evidence of subluxation. Skull base and vertebrae: No fracture or bone lesions. Bones are osteopenic. Soft tissues and spinal canal: No soft tissue swelling, incidental masses or lymphadenopathy. The visualized airway is normally patent. Disc levels:  No significant degenerative disc disease identified. Upper chest: Negative. IMPRESSION: 1. Left posterior vertex scalp hematoma without evidence of intracranial hemorrhage or skull fracture. 2. No evidence of cervical spine injury. Electronically Signed   By: Aletta Edouard M.D.   On: 02/06/2017 18:25   Ct Angio Chest Pe W Or Wo Contrast  Result Date:  02/08/2017 CLINICAL DATA:  Recent fall, hypoxia, shortness of breath EXAM: CT ANGIOGRAPHY CHEST WITH CONTRAST TECHNIQUE: Multidetector CT imaging of the chest was performed using the standard protocol during bolus administration of intravenous contrast. Multiplanar CT image reconstructions and MIPs were obtained to evaluate the vascular anatomy. CONTRAST:  70m ISOVUE-370 IOPAMIDOL (ISOVUE-370) INJECTION 76% COMPARISON:  None. FINDINGS: Cardiovascular: Pulmonary arteries appear patent. No significant filling defect or pulmonary embolus demonstrated by CTA. Thoracic aortic atherosclerosis noted. Patent 3 vessel arch anatomy. No aneurysm or dissection. Mild cardiomegaly. Native coronary atherosclerosis noted. No pericardial effusion. Mediastinum/Nodes: Small hiatal hernia noted.  No adenopathy. Lungs/Pleura: Scattered bibasilar, right middle lobe, and lingula atelectasis. No focal pneumonia, collapse or consolidation. No acute airspace process. Negative for edema or significant interstitial disease. No pleural abnormality, effusion, or pneumothorax. Trachea and central airways are patent. Upper Abdomen: Scattered small hepatic hypodensities, suspect small hepatic cysts. Chronic numerous bilateral renal cysts more on the right kidney, which are partially imaged by chest CTA. Abdominal atherosclerosis evident. Degenerative changes noted of the spine. Musculoskeletal: Bones are osteopenic. No acute compression fracture, wedge-shaped deformity or focal kyphosis. Sternum appears intact. Review of the MIP images confirms the above findings. IMPRESSION: Negative for significant acute pulmonary embolus by CTA. Bibasilar, right middle lobe, lingula scattered atelectasis. Small hiatal hernia Aortic Atherosclerosis (ICD10-I70.0). Electronically Signed   By: MJerilynn Mages  Shick M.D.   On: 02/08/2017 18:59   Ct Cervical Spine Wo Contrast  Result Date: 02/06/2017 CLINICAL DATA:  Fall.  Posterior scalp hematoma. EXAM: CT HEAD WITHOUT  CONTRAST CT CERVICAL SPINE WITHOUT CONTRAST TECHNIQUE: Multidetector CT imaging of the head and cervical spine was performed following the standard protocol without intravenous contrast. Multiplanar CT image reconstructions of the cervical spine were also generated. COMPARISON:  None. FINDINGS: CT HEAD FINDINGS Brain:  No evidence of acute infarction, hemorrhage, hydrocephalus, extra-axial collection or mass lesion/mass effect. Cortical atrophy and mild periventricular small vessel disease present. Vascular: No hyperdense vessel or unexpected calcification. Skull: Normal. Negative for fracture or focal lesion. Sinuses/Orbits: No acute finding. Other: Focal scalp hemorrhage overlying the left posterior vertex. CT CERVICAL SPINE FINDINGS Alignment: Normal cervical spine alignment without evidence of subluxation. Skull base and vertebrae: No fracture or bone lesions. Bones are osteopenic. Soft tissues and spinal canal: No soft tissue swelling, incidental masses or lymphadenopathy. The visualized airway is normally patent. Disc levels:  No significant degenerative disc disease identified. Upper chest: Negative. IMPRESSION: 1. Left posterior vertex scalp hematoma without evidence of intracranial hemorrhage or skull fracture. 2. No evidence of cervical spine injury. Electronically Signed   By: Aletta Edouard M.D.   On: 02/06/2017 18:25   Dg Chest Port 1 View  Result Date: 02/06/2017 CLINICAL DATA:  Fall EXAM: PORTABLE CHEST 1 VIEW COMPARISON:  01/11/2017 FINDINGS: Subsegmental atelectasis or scarring at the lung bases. No consolidation or effusion. Mild cardiomegaly with aortic atherosclerosis. No pneumothorax. Old right third rib fracture. IMPRESSION: 1. Subsegmental atelectasis or scarring at the bases. Negative for pneumothorax or pleural effusion 2. Stable cardiomegaly Electronically Signed   By: Donavan Foil M.D.   On: 02/06/2017 18:55   Dg Hand Complete Left  Result Date: 02/06/2017 CLINICAL DATA:  Fall,  bruising to the left hand EXAM: LEFT HAND - COMPLETE 3+ VIEW COMPARISON:  None. FINDINGS: Surgical plate and screw fixation of the distal radius across old fracture deformity. Diffuse osteopenia. No acute displaced fracture or malalignment. Diffuse joint space narrowing at the DIP and PIP joints. Arthritis at the first [facility] and MCP joints. IMPRESSION: 1. No acute osseous abnormality 2. Osteopenia with diffuse degenerative changes. Electronically Signed   By: Donavan Foil M.D.   On: 02/06/2017 18:57   Dg Hip Unilat With Pelvis 2-3 Views Left  Result Date: 02/06/2017 CLINICAL DATA:  Fall today landing on left hip. EXAM: DG HIP (WITH OR WITHOUT PELVIS) 2-3V LEFT COMPARISON:  None. FINDINGS: Diffuse osteopenia. Mild symmetric degenerative change of the hips. No acute fracture or dislocation. There are degenerative changes of the spine. IMPRESSION: No acute findings. Electronically Signed   By: Marin Olp M.D.   On: 02/06/2017 18:08    Assessment/Plan:   1. Chronic obstructive pulmonary disease with hypoxia (HCC)  02 2 L nasal cannula continuous  Follow-up with PCP for continuity of care  2. Peripheral edema  Increase torsemide 20 mg p.o. daily, starting tomorrow  Lasix 40 mg IM x1 now for edema  TED hose on in the early am, off in the pm  Elevate legs when at rest  Met B in 1 week  Follow-up with PCP ASAP after discharge for medication management, lab evaluation and continuity of care    Patient is being discharged with the following home health services: Home health PT/OT/nursing/social work  Patient is being discharged with the following durable medical equipment: Oxygen  Patient has been advised to f/u with their PCP in 1-2 weeks to bring them up to date on their rehab stay.  Social services at facility was responsible for arranging this appointment.  Pt was provided with a 30 day supply of prescriptions for medications and refills must be obtained from their PCP.  For controlled  substances, a more limited supply may be provided adequate until PCP appointment only.  Future labs/tests needed: Met B in 1 week due to increase in diuretic  Family/ staff Communication:   Total Time:  Documentation:  Face to Face:  Family/Phone:  Vikki Ports, NP-C [facility] [address], [place] [address] Cell Phone (Mon-Fri 8am-5pm):  [phone] On Call:  [phone] & follow prompts after 5pm & weekends Office Phone:  [phone] Office Fax:  [phone]

## 2017-04-26 NOTE — Progress Notes (Signed)
Location:      Place of Service:  SNF (31) Provider:  Toni Arthurs, NP-C  [name], MD  Patient Care Team: [name], MD as PCP - General [facility])  Extended Emergency Contact Information Primary Emergency Contact: Thornell,James Address: [address], [place] [address] Johnnette Litter of [place] Phone: [phone] Work Phone: [phone] Mobile Phone: [phone] Relation: Son Secondary Emergency Contact: Thurston Pounds Mobile Phone: [phone] Relation: Niece Interpreter needed? No  Code Status: Full Goals of care: Advanced Directive information Advanced Directives 02/23/2017  Does Patient Have a Medical Advance Directive? No  Type of Advance Directive -  Does patient want to make changes to medical advance directive? -  Copy of [place] in Chart? -  Would patient like information on creating a medical advance directive? -     Chief Complaint  Patient presents with  . Medical Management of Chronic Issues    HPI:  Pt is a 84 y.o. female seen today for medical management of chronic diseases.  Patient was admitted to the facility for rehab for generalized weakness and deconditioning following hospitalization at [facility] for falls and hypoxia.  Patient has been participating in PT/OT. Patient does have 2+ BLE edema that patient reports is at her baseline.  At this time, patient denies chest pain or shortness of breath.  O2 2 L/minute per nasal cannula in place.  No reports of hypoxia.  Patient reports her appetite is good/she is eating well.  Patient is voiding without difficulty and having regular BMs.  Vital signs stable.  No other complaints.     Past Medical History:  Diagnosis Date  . Anxiety    unspecified  . Arthritis   . Cancer [facility]) 2011   Right  . Cataract cortical, senile   . Depression   . Gout   . History of breast cancer   . History of poliomyelitis    as a child  . History of rosacea   . Hyperlipidemia,  unspecified   . Hypertension   . Hypothyroidism    unspecified  . Osteoporosis, post-menopausal   . Pericarditis   . Personal history of radiation therapy   . Renal insufficiency   . Seborrheic dermatitis   . Thyroid disease    Past Surgical History:  Procedure Laterality Date  . BACK SURGERY    . BASAL CELL CARCINOMA EXCISION Left    BCC left cheek  . BREAST BIOPSY Right 2011   +  . CARDIAC CATHETERIZATION  03/15/2005  . CATARACT EXTRACTION Left 2008  . FRACTURE SURGERY Left 05/17/2005   ORIF left distal radius fracture  . FRACTURE SURGERY Left 05/2005   left wrist  . FRACTURE SURGERY  04/2004   right wrist  . MASTECTOMY PARTIAL / LUMPECTOMY Right 2011   right breast    Allergies  Allergen Reactions  . Codeine Nausea And Vomiting  . Tetracyclines & Related Nausea And Vomiting  . Adhesive [Tape] Rash  . Keflex [Cephalexin] Rash  . Naproxen Rash    Patient was on both Keflex and Naproxen when developed rash -  unclear as to which caused rash    Allergies as of 02/17/2017      Reactions   Codeine Nausea And Vomiting   Tetracyclines & Related Nausea And Vomiting   Adhesive [tape] Rash   Keflex [cephalexin] Rash   Naproxen Rash      Medication List  Accurate as of 02/17/17 11:59 PM. Always use your most recent med list.          acetaminophen 325 MG tablet Commonly known as:  TYLENOL Take 2 tablets (650 mg total) by mouth every 6 (six) hours as needed for mild pain, moderate pain or headache (headache).   amLODipine 10 MG tablet Commonly known as:  NORVASC Take 1 tablet (10 mg total) by mouth daily.   aspirin EC 81 MG tablet Take 81 mg by mouth at bedtime.   buPROPion 150 MG 24 hr tablet Commonly known as:  WELLBUTRIN XL Take 150 mg by mouth daily.   cholecalciferol 1000 units tablet Commonly known as:  VITAMIN D Take 2,000 Units by mouth daily.   ciprofloxacin 0.3 % ophthalmic solution Commonly known as:  CILOXAN Place 1 drop into both eyes  every 4 (four) hours while awake. Administer 1 drop, every 2 hours, while awake, for 2 days. Then 1 drop, every 4 hours, while awake, for the next 5 days.   docusate sodium 100 MG capsule Commonly known as:  COLACE Take 1 capsule (100 mg total) by mouth 2 (two) times daily.   escitalopram 20 MG tablet Commonly known as:  LEXAPRO Take 20 mg by mouth daily.   hydrALAZINE 25 MG tablet Commonly known as:  APRESOLINE Take 1 tablet (25 mg total) by mouth 3 (three) times daily.   ipratropium-albuterol 0.5-2.5 (3) MG/3ML Soln Commonly known as:  DUONEB Take 3 mLs by nebulization 2 (two) times daily.   levothyroxine 100 MCG tablet Commonly known as:  SYNTHROID, LEVOTHROID Take 100 mcg by mouth daily before breakfast.   metoprolol tartrate 50 MG tablet Commonly known as:  LOPRESSOR Take 1 tablet (50 mg total) by mouth 2 (two) times daily.   polyethylene glycol packet Commonly known as:  MIRALAX / GLYCOLAX Take 17 g by mouth daily as needed for moderate constipation or severe constipation.   simvastatin 20 MG tablet Commonly known as:  ZOCOR Take 20 mg by mouth at bedtime.   traZODone 50 MG tablet Commonly known as:  DESYREL Take 0.5 tablets (25 mg total) by mouth at bedtime as needed for sleep.       Review of Systems  Constitutional: Negative for activity change, appetite change, chills, diaphoresis and fever.  HENT: Negative for congestion, mouth sores, nosebleeds, postnasal drip, sneezing, sore throat, trouble swallowing and voice change.   Respiratory: Negative for apnea, cough, choking, chest tightness, shortness of breath and wheezing.   Cardiovascular: Negative for chest pain, palpitations and leg swelling.  Gastrointestinal: Negative for abdominal distention, abdominal pain, constipation, diarrhea and nausea.  Genitourinary: Negative for difficulty urinating, dysuria, frequency and urgency.  Musculoskeletal: Positive for arthralgias (typical arthritis) and gait problem.  Negative for back pain and myalgias.  Skin: Negative for color change, pallor, rash and wound.  Neurological: Positive for weakness. Negative for dizziness, tremors, syncope, speech difficulty, numbness and headaches.  Psychiatric/Behavioral: Negative for agitation and behavioral problems.  All other systems reviewed and are negative.   Immunization History  Administered Date(s) Administered  . Influenza Split 12/30/2013, 12/08/2014  . Influenza-Unspecified 11/30/2012, 12/30/2013, 12/08/2014, 12/22/2016   Pertinent  Health Maintenance Due  Topic Date Due  . PNA vac Low Risk Adult (1 of 2 - PCV13) 11/06/1991  . INFLUENZA VACCINE  Completed  . DEXA SCAN  Completed   No flowsheet data found. Functional Status Survey:    Vitals:   02/17/17 0500  BP: 136/78  Pulse: 73  Resp: 18  Temp: 98.4 F (36.9 C)  SpO2: 95%   There is no height or weight on file to calculate BMI. Physical Exam  Constitutional: She is oriented to person, place, and time. Vital signs are normal. She appears well-developed and well-nourished. She is active and cooperative. She does not appear ill. No distress. Nasal cannula in place.  HENT:  Head: Normocephalic and atraumatic.  Mouth/Throat: Uvula is midline, oropharynx is clear and moist and mucous membranes are normal. Mucous membranes are not pale, not dry and not cyanotic.  Eyes: Conjunctivae, EOM and lids are normal. Pupils are equal, round, and reactive to light.  Neck: Trachea normal, normal range of motion and full passive range of motion without pain. Neck supple. No JVD present. No tracheal deviation, no edema and no erythema present. No thyromegaly present.  Cardiovascular: Normal rate, regular rhythm, normal heart sounds, intact distal pulses and normal pulses. Exam reveals no gallop, no distant heart sounds and no friction rub.  No murmur heard. Pulses:      Dorsalis pedis pulses are 2+ on the right side, and 2+ on the left side.  2+ BLE edema    Pulmonary/Chest: Effort normal and breath sounds normal. No accessory muscle usage. No respiratory distress. She has no decreased breath sounds. She has no wheezes. She has no rhonchi. She has no rales. She exhibits no tenderness.  Abdominal: Soft. Normal appearance and bowel sounds are normal. She exhibits no distension and no ascites. There is no tenderness.  Musculoskeletal: Normal range of motion. She exhibits no edema or tenderness.  Expected osteoarthritis, stiffness; Bilateral Calves soft, supple. Negative Homan's Sign. B- pedal pulses equal; generalized weakness and deconditioning   Neurological: She is alert and oriented to person, place, and time. She has normal strength. Coordination abnormal.  Skin: Skin is warm, dry and intact. She is not diaphoretic. No cyanosis. No pallor. Nails show no clubbing.  Psychiatric: She has a normal mood and affect. Her speech is normal and behavior is normal. Judgment and thought content normal. Cognition and memory are normal.  Nursing note and vitals reviewed.   Labs reviewed: Recent Labs    02/07/17 0418 02/28/17 0430 03/06/17 0400  NA 139 141 139  K 3.9 4.1 4.0  CL 107 99* 99*  CO2 27 29 30   GLUCOSE 96 61* 88  BUN 18 25* 29*  CREATININE 0.99 1.11* 1.25*  CALCIUM 8.5* 8.9 8.8*   Recent Labs    02/28/17 0430 03/06/17 0400  AST 25 17  ALT 12* 11*  ALKPHOS 61 60  BILITOT 0.7 0.3  PROT 6.4* 6.5  ALBUMIN 2.9* 3.0*   Recent Labs    02/06/17 2000 02/07/17 0418 02/28/17 0430 03/06/17 0400  WBC 9.3 6.5 10.6 7.6  NEUTROABS 7.5*  --  7.7* 4.9  HGB 13.3 11.6* 11.3* 11.1*  HCT 42.1 36.1 35.8 34.9*  MCV 86.1 86.6 88.0 87.7  PLT 276 242 281 289   Lab Results  Component Value Date   TSH 2.370 09/08/2015   No results found for: HGBA1C No results found for: CHOL, HDL, LDLCALC, LDLDIRECT, TRIG, CHOLHDL  Significant Diagnostic Results in last 30 days:  No results found.  Assessment/Plan Jaton was seen today for medical  management of chronic issues.  Diagnoses and all orders for this visit:  Hypoxia  Peripheral edema   Above conditions stable  Continue current medication regimen  Edema is at patient's baseline  Elevate legs when at rest  Continue to monitor patient's oxygen saturation every shift  and as needed  Continue oxygen 2 L nasal cannula  Continue PT/OT  Continue exercises as taught by PT/OT  Encourage patient to use incentive spirometer  Encourage patient to stay sitting up/in the chair as much as possible  Family/ staff Communication:   Total Time:  Documentation:  Face to Face:  Family/Phone:   Labs/tests ordered:   Medication list reviewed and assessed for continued appropriateness. Monthly medication orders reviewed and signed.  Vikki Ports, NP-C Geriatrics [facility] [phone], [place] [address] Cell Phone (Mon-Fri 8am-5pm):  [phone] On Call:  [phone] & follow prompts after 5pm & weekends Office Phone:  [phone] Office Fax:  [phone]

## 2018-02-16 ENCOUNTER — Other Ambulatory Visit: Payer: Self-pay

## 2018-02-16 ENCOUNTER — Encounter: Payer: Self-pay | Admitting: Emergency Medicine

## 2018-02-16 ENCOUNTER — Inpatient Hospital Stay
Admission: EM | Admit: 2018-02-16 | Discharge: 2018-02-18 | DRG: 071 | Disposition: A | Discharge status: Home Health | Payer: Medicare Other | Attending: Specialist | Admitting: Specialist

## 2018-02-16 DIAGNOSIS — L219 Seborrheic dermatitis, unspecified: Secondary | ICD-10-CM | POA: Diagnosis present

## 2018-02-16 DIAGNOSIS — Z23 Encounter for immunization: Secondary | ICD-10-CM

## 2018-02-16 DIAGNOSIS — N183 Chronic kidney disease, stage 3 (moderate): Secondary | ICD-10-CM | POA: Diagnosis present

## 2018-02-16 DIAGNOSIS — J9610 Chronic respiratory failure, unspecified whether with hypoxia or hypercapnia: Secondary | ICD-10-CM | POA: Diagnosis present

## 2018-02-16 DIAGNOSIS — M810 Age-related osteoporosis without current pathological fracture: Secondary | ICD-10-CM | POA: Diagnosis present

## 2018-02-16 DIAGNOSIS — N390 Urinary tract infection, site not specified: Secondary | ICD-10-CM | POA: Diagnosis present

## 2018-02-16 DIAGNOSIS — Z886 Allergy status to analgesic agent status: Secondary | ICD-10-CM

## 2018-02-16 DIAGNOSIS — Z885 Allergy status to narcotic agent status: Secondary | ICD-10-CM | POA: Diagnosis not present

## 2018-02-16 DIAGNOSIS — Z9842 Cataract extraction status, left eye: Secondary | ICD-10-CM

## 2018-02-16 DIAGNOSIS — Z79899 Other long term (current) drug therapy: Secondary | ICD-10-CM | POA: Diagnosis not present

## 2018-02-16 DIAGNOSIS — M1990 Unspecified osteoarthritis, unspecified site: Secondary | ICD-10-CM | POA: Diagnosis present

## 2018-02-16 DIAGNOSIS — Z8612 Personal history of poliomyelitis: Secondary | ICD-10-CM | POA: Diagnosis not present

## 2018-02-16 DIAGNOSIS — G9341 Metabolic encephalopathy: Principal | ICD-10-CM | POA: Diagnosis present

## 2018-02-16 DIAGNOSIS — Z881 Allergy status to other antibiotic agents status: Secondary | ICD-10-CM

## 2018-02-16 DIAGNOSIS — F0390 Unspecified dementia without behavioral disturbance: Secondary | ICD-10-CM | POA: Diagnosis present

## 2018-02-16 DIAGNOSIS — Z8249 Family history of ischemic heart disease and other diseases of the circulatory system: Secondary | ICD-10-CM | POA: Diagnosis not present

## 2018-02-16 DIAGNOSIS — E785 Hyperlipidemia, unspecified: Secondary | ICD-10-CM | POA: Diagnosis present

## 2018-02-16 DIAGNOSIS — Z801 Family history of malignant neoplasm of trachea, bronchus and lung: Secondary | ICD-10-CM

## 2018-02-16 DIAGNOSIS — E039 Hypothyroidism, unspecified: Secondary | ICD-10-CM | POA: Diagnosis present

## 2018-02-16 DIAGNOSIS — I129 Hypertensive chronic kidney disease with stage 1 through stage 4 chronic kidney disease, or unspecified chronic kidney disease: Secondary | ICD-10-CM | POA: Diagnosis present

## 2018-02-16 DIAGNOSIS — R4182 Altered mental status, unspecified: Secondary | ICD-10-CM

## 2018-02-16 DIAGNOSIS — F329 Major depressive disorder, single episode, unspecified: Secondary | ICD-10-CM | POA: Diagnosis present

## 2018-02-16 DIAGNOSIS — Z853 Personal history of malignant neoplasm of breast: Secondary | ICD-10-CM | POA: Diagnosis not present

## 2018-02-16 DIAGNOSIS — Z820 Family history of epilepsy and other diseases of the nervous system: Secondary | ICD-10-CM

## 2018-02-16 DIAGNOSIS — M109 Gout, unspecified: Secondary | ICD-10-CM | POA: Diagnosis present

## 2018-02-16 DIAGNOSIS — Z85828 Personal history of other malignant neoplasm of skin: Secondary | ICD-10-CM | POA: Diagnosis not present

## 2018-02-16 DIAGNOSIS — Z923 Personal history of irradiation: Secondary | ICD-10-CM

## 2018-02-16 DIAGNOSIS — Z79890 Hormone replacement therapy (postmenopausal): Secondary | ICD-10-CM

## 2018-02-16 DIAGNOSIS — E876 Hypokalemia: Secondary | ICD-10-CM | POA: Diagnosis present

## 2018-02-16 DIAGNOSIS — R32 Unspecified urinary incontinence: Secondary | ICD-10-CM | POA: Diagnosis present

## 2018-02-16 DIAGNOSIS — F419 Anxiety disorder, unspecified: Secondary | ICD-10-CM | POA: Diagnosis present

## 2018-02-16 DIAGNOSIS — Z91048 Other nonmedicinal substance allergy status: Secondary | ICD-10-CM

## 2018-02-16 LAB — COMPREHENSIVE METABOLIC PANEL
ALK PHOS: 83 U/L (ref 38–126)
ALT: 15 U/L (ref 0–44)
AST: 23 U/L (ref 15–41)
Albumin: 3.4 g/dL — ABNORMAL LOW (ref 3.5–5.0)
Anion gap: 9 (ref 5–15)
BILIRUBIN TOTAL: 0.7 mg/dL (ref 0.3–1.2)
BUN: 20 mg/dL (ref 8–23)
CALCIUM: 8.6 mg/dL — AB (ref 8.9–10.3)
CHLORIDE: 99 mmol/L (ref 98–111)
CO2: 30 mmol/L (ref 22–32)
CREATININE: 1.42 mg/dL — AB (ref 0.44–1.00)
GFR calc Af Amer: 37 mL/min — ABNORMAL LOW (ref >60)
GFR, EST NON AFRICAN AMERICAN: 32 mL/min — AB (ref >60)
Glucose, Bld: 122 mg/dL — ABNORMAL HIGH (ref 70–99)
Potassium: 2.9 mmol/L — ABNORMAL LOW (ref 3.5–5.1)
Sodium: 138 mmol/L (ref 135–145)
TOTAL PROTEIN: 7.3 g/dL (ref 6.5–8.1)

## 2018-02-16 LAB — URINALYSIS, ROUTINE W REFLEX MICROSCOPIC
Bilirubin Urine: NEGATIVE
Glucose, UA: NEGATIVE mg/dL
HGB URINE DIPSTICK: NEGATIVE
Ketones, ur: NEGATIVE mg/dL
NITRITE: NEGATIVE
PROTEIN: NEGATIVE mg/dL
SPECIFIC GRAVITY, URINE: 1.014 (ref 1.005–1.030)
WBC, UA: >50 WBC/hpf — ABNORMAL HIGH (ref 0–5)
pH: 5 (ref 5.0–8.0)

## 2018-02-16 LAB — CBC
HCT: 41.3 % (ref 36.0–46.0)
Hemoglobin: 12.7 g/dL (ref 12.0–15.0)
MCH: 28.1 pg (ref 26.0–34.0)
MCHC: 30.8 g/dL (ref 30.0–36.0)
MCV: 91.4 fL (ref 80.0–100.0)
PLATELETS: 336 10*3/uL (ref 150–400)
RBC: 4.52 MIL/uL (ref 3.87–5.11)
RDW: 14.4 % (ref 11.5–15.5)
WBC: 8.2 10*3/uL (ref 4.0–10.5)
nRBC: 0 % (ref 0.0–0.2)

## 2018-02-16 LAB — MAGNESIUM: Magnesium: 2.2 mg/dL (ref 1.7–2.4)

## 2018-02-16 MED ORDER — ONDANSETRON HCL 4 MG PO TABS: 4.0000 mg | ORAL_TABLET | Freq: Four times a day (QID) | ORAL | Status: DC | PRN

## 2018-02-16 MED ORDER — ALBUTEROL SULFATE (2.5 MG/3ML) 0.083% IN NEBU: 2.5000 mg | INHALATION_SOLUTION | RESPIRATORY_TRACT | Status: DC | PRN

## 2018-02-16 MED ORDER — ENOXAPARIN SODIUM 40 MG/0.4ML ~~LOC~~ SOLN
40.0000 mg | SUBCUTANEOUS | Status: DC
  Administered 2018-02-16: 40 mg via SUBCUTANEOUS
  Filled 2018-02-16: qty 0.4

## 2018-02-16 MED ORDER — INFLUENZA VAC SPLIT HIGH-DOSE 0.5 ML IM SUSY
0.5000 mL | PREFILLED_SYRINGE | INTRAMUSCULAR | Status: AC
  Administered 2018-02-17: 13:00:00 0.5 mL via INTRAMUSCULAR
  Filled 2018-02-16 (×2): qty 0.5

## 2018-02-16 MED ORDER — POLYETHYLENE GLYCOL 3350 17 G PO PACK
17.0000 g | PACK | Freq: Every day | ORAL | Status: DC | PRN
  Administered 2018-02-17: 17 g via ORAL
  Filled 2018-02-16: qty 1

## 2018-02-16 MED ORDER — CIPROFLOXACIN IN D5W 400 MG/200ML IV SOLN
400.0000 mg | Freq: Once | INTRAVENOUS | Status: AC
  Administered 2018-02-16: 400 mg via INTRAVENOUS
  Filled 2018-02-16: qty 200

## 2018-02-16 MED ORDER — POTASSIUM CHLORIDE IN NACL 40-0.9 MEQ/L-% IV SOLN
INTRAVENOUS | Status: DC
Start: 2018-02-16 — End: 2018-02-17
  Administered 2018-02-16: 75 mL/h via INTRAVENOUS
  Filled 2018-02-16: qty 1000

## 2018-02-16 MED ORDER — ACETAMINOPHEN 650 MG RE SUPP: 650.0000 mg | Freq: Four times a day (QID) | RECTAL | Status: DC | PRN

## 2018-02-16 MED ORDER — PNEUMOCOCCAL VAC POLYVALENT 25 MCG/0.5ML IJ INJ
0.5000 mL | INJECTION | INTRAMUSCULAR | Status: AC
  Administered 2018-02-17: 13:00:00 0.5 mL via INTRAMUSCULAR
  Filled 2018-02-16: qty 0.5

## 2018-02-16 MED ORDER — ACETAMINOPHEN 325 MG PO TABS: 650.0000 mg | ORAL_TABLET | Freq: Four times a day (QID) | ORAL | Status: DC | PRN

## 2018-02-16 MED ORDER — POTASSIUM CHLORIDE CRYS ER 20 MEQ PO TBCR
40.0000 meq | EXTENDED_RELEASE_TABLET | Freq: Once | ORAL | Status: AC
  Administered 2018-02-16: 40 meq via ORAL
  Filled 2018-02-16: qty 2

## 2018-02-16 MED ORDER — POTASSIUM CHLORIDE 10 MEQ/100ML IV SOLN
10.0000 meq | INTRAVENOUS | Status: AC
  Administered 2018-02-16 (×2): 10 meq via INTRAVENOUS
  Filled 2018-02-16 (×3): qty 100

## 2018-02-16 MED ORDER — ONDANSETRON HCL 4 MG/2ML IJ SOLN: 4.0000 mg | Freq: Four times a day (QID) | INTRAMUSCULAR | Status: DC | PRN

## 2018-02-16 MED ORDER — CIPROFLOXACIN IN D5W 200 MG/100ML IV SOLN
200.0000 mg | Freq: Two times a day (BID) | INTRAVENOUS | Status: DC
  Administered 2018-02-17 – 2018-02-18 (×3): 200 mg via INTRAVENOUS
  Filled 2018-02-16 (×5): qty 100

## 2018-02-16 MED ORDER — SODIUM CHLORIDE 0.9 % IV BOLUS
1000.0000 mL | Freq: Once | INTRAVENOUS | Status: AC
  Administered 2018-02-16: 1000 mL via INTRAVENOUS

## 2018-02-16 NOTE — ED Provider Notes (Signed)
[facility] Emergency Department Provider Note   ____________________________________________   I have reviewed the triage vital signs and the nursing notes.   HISTORY  Chief Complaint Altered Mental Status   History limited by and level 5 caveat due to: Altered Mental Status   HPI Andrea Schaefer is a 85 y.o. female who presents to the emergency department today because of concerns for confusion.  Apparently she called out of her son stating that she had been feeling unwell for the past couple of days.  The patient herself cannot give a great history although there is family at bedside.  They state that the patient has had urinary tract infections in the past and is got confused with them.  They are not sure if the patient is taking her medications at the prescribed times.  They have noticed a bad odor about her.  They do not think the patient has had any recent falls.   Per medical record review patient has a history of ER visits with UTI in the past  Past Medical History:  Diagnosis Date  . Anxiety    unspecified  . Arthritis   . Cancer [facility]) 2011   Right  . Cataract cortical, senile   . Depression   . Gout   . History of breast cancer   . History of poliomyelitis    as a child  . History of rosacea   . Hyperlipidemia, unspecified   . Hypertension   . Hypothyroidism    unspecified  . Osteoporosis, post-menopausal   . Pericarditis   . Personal history of radiation therapy   . Renal insufficiency   . Seborrheic dermatitis   . Thyroid disease     Patient Active Problem List   Diagnosis Date Noted  . Chronic obstructive pulmonary disease with hypoxia ([place]) 03/07/2017  . Peripheral edema 03/06/2017  . Generalized weakness 03/06/2017  . Hypoxia 02/06/2017  . Pneumonia 09/08/2015  . Compression fracture of L3 lumbar vertebra 09/12/2014  . Intractable back pain 09/12/2014    Past Surgical History:  Procedure Laterality Date  . BACK SURGERY     . BASAL CELL CARCINOMA EXCISION Left    BCC left cheek  . BREAST BIOPSY Right 2011   +  . CARDIAC CATHETERIZATION  03/15/2005  . CATARACT EXTRACTION Left 2008  . FRACTURE SURGERY Left 05/17/2005   ORIF left distal radius fracture  . FRACTURE SURGERY Left 05/2005   left wrist  . FRACTURE SURGERY  04/2004   right wrist  . MASTECTOMY PARTIAL / LUMPECTOMY Right 2011   right breast    Prior to Admission medications   Medication Sig Start Date End Date Taking? Authorizing Provider  acetaminophen (TYLENOL) 325 MG tablet Take 2 tablets (650 mg total) by mouth every 6 (six) hours as needed for mild pain, moderate pain or headache (headache). 09/11/15   Gladstone Lighter, MD  amLODipine (NORVASC) 10 MG tablet Take 1 tablet (10 mg total) by mouth daily. 09/18/14   Demetrios Loll, MD  buPROPion (WELLBUTRIN XL) 150 MG 24 hr tablet Take 150 mg by mouth daily.    [provider]  cholecalciferol (VITAMIN D) 1000 units tablet Take 2,000 Units by mouth daily.    [provider]  docusate sodium (COLACE) 100 MG capsule Take 1 capsule (100 mg total) by mouth 2 (two) times daily. 02/10/17   Loletha Grayer, MD  escitalopram (LEXAPRO) 10 MG tablet Take 10 mg by mouth daily.    [provider]  hydrALAZINE (APRESOLINE) 25 MG tablet Take 1 tablet (25 mg total) by mouth 3 (three) times daily. 09/18/14   Demetrios Loll, MD  ipratropium-albuterol (DUONEB) 0.5-2.5 (3) MG/3ML SOLN Take 3 mLs by nebulization 2 (two) times daily. 02/10/17   Loletha Grayer, MD  levothyroxine (SYNTHROID, LEVOTHROID) 100 MCG tablet Take 100 mcg by mouth daily before breakfast.    [provider]  metoprolol (LOPRESSOR) 50 MG tablet Take 1 tablet (50 mg total) by mouth 2 (two) times daily. 09/18/14   Demetrios Loll, MD  OXYGEN Inhale 2 L into the lungs continuous.    [provider]  polyethylene glycol (MIRALAX / GLYCOLAX) packet Take 17 g by mouth daily as needed for moderate constipation or severe  constipation. 02/10/17   Loletha Grayer, MD  potassium chloride (MICRO-K) 10 MEQ CR capsule Take 10 mEq by mouth daily.    [provider]  simvastatin (ZOCOR) 20 MG tablet Take 20 mg by mouth at bedtime.     [provider]    Allergies Codeine; Tetracyclines & related; Adhesive [tape]; Keflex [cephalexin]; and Naproxen  Family History  Problem Relation Age of Onset  . Hypertension Other   . Alzheimer's disease Mother   . Coronary artery disease Mother   . Hypertension Mother   . Lung cancer Father   . Alzheimer's disease Brother   . Breast cancer Neg Hx     Social History Social History   Tobacco Use  . Smoking status: Never Smoker  . Smokeless tobacco: Never Used  Substance Use Topics  . Alcohol use: No  . Drug use: No    Review of Systems Constitutional: No fever/chills Eyes: No visual changes. ENT: No sore throat. Cardiovascular: Denies chest pain. Respiratory: Denies shortness of breath. Gastrointestinal: No abdominal pain.  No nausea, no vomiting.  No diarrhea.   Genitourinary: Negative for dysuria. Musculoskeletal: Negative for back pain. Skin: Negative for rash. Neurological: Positive for confusion. ____________________________________________   PHYSICAL EXAM:  VITAL SIGNS: ED Triage Vitals  Enc Vitals Group     BP 02/16/18 1424 123/66     Pulse Rate 02/16/18 1424 73     Resp 02/16/18 1424 18     Temp 02/16/18 1424 98 F (36.7 C)     Temp Source 02/16/18 1424 Oral     SpO2 02/16/18 1423 98 %     Weight 02/16/18 1425 200 lb (90.7 kg)     Height 02/16/18 1425 5\' 5"  (1.651 m)     Head Circumference --      Peak Flow --      Pain Score 02/16/18 1425 0   Constitutional: Alert and oriented.  Eyes: Conjunctivae are normal.  ENT      Head: Normocephalic and atraumatic.      Nose: No congestion/rhinnorhea.      Mouth/Throat: Mucous membranes are moist.      Neck: No stridor. Hematological/Lymphatic/Immunilogical: No cervical  lymphadenopathy. Cardiovascular: Normal rate, regular rhythm.  No murmurs, rubs, or gallops.  Respiratory: Normal respiratory effort without tachypnea nor retractions. Breath sounds are clear and equal bilaterally. No wheezes/rales/rhonchi. Gastrointestinal: Soft and non tender. No rebound. No guarding.  Genitourinary: Deferred Musculoskeletal: Normal range of motion in all extremities. No lower extremity edema. Neurologic:  Confusion. No gross focal neurologic deficits are appreciated.  Skin:  Skin is warm, dry and intact. No rash noted. Psychiatric: Mood and affect are normal. Speech and behavior are normal. Patient exhibits appropriate insight and judgment.  ____________________________________________    LABS (pertinent  positives/negatives)  CBC wbc 8.2, hgb 12.7, plt 336 CMP na 138, k 2.9, glu 122, cr 1.42 UA cloudy, large leukocytes, >50 wbc ____________________________________________   EKG  I, Nance Pear, attending physician, personally viewed and interpreted this EKG  EKG Time: 1433 Rate: 72 Rhythm: sinus rhythm Axis: left axis deviation Intervals: qtc 479 QRS: LAFB ST changes: no st elevation Impression: abnormal ekg  ____________________________________________    RADIOLOGY  None  ____________________________________________   PROCEDURES  Procedures  ____________________________________________   INITIAL IMPRESSION / ASSESSMENT AND PLAN / ED COURSE  Pertinent labs & imaging results that were available during my care of the patient were reviewed by me and considered in my medical decision making (see chart for details).   Patient presented to the emergency department today because of concerns for altered mental status.  Differential would be broad.  Would include infections, anemia, intracranial process amongst other etiologies.  Patient's urine is consistent with urinary tract infection.  At this point I think this is likely what is driving the  patient's altered mental status.  Will start IV antibiotics plan on admission.  Discussed findings and plan with patient and family.   ____________________________________________   FINAL CLINICAL IMPRESSION(S) / ED DIAGNOSES  Final diagnoses:  Altered mental status, unspecified altered mental status type  Lower urinary tract infectious disease     Note: This dictation was prepared with Dragon dictation. Any transcriptional errors that result from this process are unintentional     Nance Pear, MD 02/16/18 [phone]

## 2018-02-16 NOTE — ED Triage Notes (Signed)
PT to ER via EMS from home with reports of increased altered mental status over the last few days.  Pt has pungent smell of urine.

## 2018-02-16 NOTE — ED Notes (Signed)
ED TO INPATIENT HANDOFF REPORT  Name/Age/Gender Andrea Schaefer 85 y.o. female  Code Status    Code Status Orders  (From admission, onward)         Start     Ordered   02/16/18 1921  Full code  Continuous     02/16/18 1921        Code Status History    Date Active Date Inactive Code Status Order ID Comments User Context   02/06/2017 2332 02/11/2017 1448 Full Code [id number]  Max Sane, MD Inpatient   09/08/2015 2101 09/11/2015 1243 Full Code [id number]  Max Sane, MD Inpatient   09/12/2014 2025 09/18/2014 1609 Full Code [id number]  Hower, Aaron Mose, MD ED      Home/SNF/Other Home  Chief Complaint AMs  Level of Care/Admitting Diagnosis ED Disposition    ED Disposition Condition [address]: [place] [100120]  Level of Care: Med-Surg [16]  Diagnosis: UTI (urinary tract infection) [505397]  Admitting Physician: Hillary Bow [[id number]]  Attending Physician: Hillary Bow [[id number]]  Estimated length of stay: past midnight tomorrow  Certification:: I certify this patient will need inpatient services for at least 2 midnights  PT Class (Do Not Modify): Inpatient [101]  PT Acc Code (Do Not Modify): Private [1]       Medical History Past Medical History:  Diagnosis Date  . Anxiety    unspecified  . Arthritis   . Cancer [facility]) 2011   Right  . Cataract cortical, senile   . Depression   . Gout   . History of breast cancer   . History of poliomyelitis    as a child  . History of rosacea   . Hyperlipidemia, unspecified   . Hypertension   . Hypothyroidism    unspecified  . Osteoporosis, post-menopausal   . Pericarditis   . Personal history of radiation therapy   . Renal insufficiency   . Seborrheic dermatitis   . Thyroid disease     Allergies Allergies  Allergen Reactions  . Codeine Nausea And Vomiting  . Tetracyclines & Related Nausea And Vomiting  . Adhesive [Tape] Rash  . Keflex [Cephalexin] Rash  . Naproxen Rash     Patient was on both Keflex and Naproxen when developed rash -  unclear as to which caused rash    IV Location/Drains/Wounds Patient Lines/Drains/Airways Status   Active Line/Drains/Airways    Name:   Placement date:   Placement time:   Site:   Days:   Peripheral IV 02/16/18 Left Antecubital   02/16/18    1501    Antecubital   less than 1          Labs/Imaging Results for orders placed or performed during the hospital encounter of 02/16/18 (from the past 48 hour(s))  Comprehensive metabolic panel     Status: Abnormal   Collection Time: 02/16/18  3:04 PM  Result Value Ref Range   Sodium 138 135 - 145 mmol/L   Potassium 2.9 (L) 3.5 - 5.1 mmol/L   Chloride 99 98 - 111 mmol/L   CO2 30 22 - 32 mmol/L   Glucose, Bld 122 (H) 70 - 99 mg/dL   BUN 20 8 - 23 mg/dL   Creatinine, Ser 1.42 (H) 0.44 - 1.00 mg/dL   Calcium 8.6 (L) 8.9 - 10.3 mg/dL   Total Protein 7.3 6.5 - 8.1 g/dL   Albumin 3.4 (L) 3.5 - 5.0 g/dL   AST 23 15 - 41 U/L  ALT 15 0 - 44 U/L   Alkaline Phosphatase 83 38 - 126 U/L   Total Bilirubin 0.7 0.3 - 1.2 mg/dL   GFR calc non Af Amer 32 (L) >60 mL/min   GFR calc Af Amer 37 (L) >60 mL/min   Anion gap 9 5 - 15    Comment: Performed at [facility], [address] [place], [place] [address]  CBC     Status: None   Collection Time: 02/16/18  3:04 PM  Result Value Ref Range   WBC 8.2 4.0 - 10.5 K/uL   RBC 4.52 3.87 - 5.11 MIL/uL   Hemoglobin 12.7 12.0 - 15.0 g/dL   HCT 41.3 36.0 - 46.0 %   MCV 91.4 80.0 - 100.0 fL   MCH 28.1 26.0 - 34.0 pg   MCHC 30.8 30.0 - 36.0 g/dL   RDW 14.4 11.5 - 15.5 %   Platelets 336 150 - 400 K/uL   nRBC 0.0 0.0 - 0.2 %    Comment: Performed at [facility], [address] [place], [place] [address]  Magnesium     Status: None   Collection Time: 02/16/18  3:04 PM  Result Value Ref Range   Magnesium 2.2 1.7 - 2.4 mg/dL    Comment: Performed at [facility], [address] [place], [place] [address]   Urinalysis, Routine w reflex microscopic     Status: Abnormal   Collection Time: 02/16/18  4:00 PM  Result Value Ref Range   Color, Urine AMBER (A) YELLOW    Comment: BIOCHEMICALS MAY BE AFFECTED BY COLOR   APPearance CLOUDY (A) CLEAR   Specific Gravity, Urine 1.014 1.005 - 1.030   pH 5.0 5.0 - 8.0   Glucose, UA NEGATIVE NEGATIVE mg/dL   Hgb urine dipstick NEGATIVE NEGATIVE   Bilirubin Urine NEGATIVE NEGATIVE   Ketones, ur NEGATIVE NEGATIVE mg/dL   Protein, ur NEGATIVE NEGATIVE mg/dL   Nitrite NEGATIVE NEGATIVE   Leukocytes, UA LARGE (A) NEGATIVE   RBC / HPF 11-20 0 - 5 RBC/hpf   WBC, UA >50 (H) 0 - 5 WBC/hpf   Bacteria, UA FEW (A) NONE SEEN   Squamous Epithelial / LPF 0-5 0 - 5   WBC Clumps PRESENT    Hyaline Casts, UA PRESENT     Comment: Performed at [facility], [address] [place], [place] [address]   No results found.  Pending Labs Unresulted Labs (From admission, onward)    Start     Ordered   02/23/18 0500  Creatinine, serum  (enoxaparin (LOVENOX)    CrCl >/= 30 ml/min)  Weekly,   STAT    Comments:  while on enoxaparin therapy    02/16/18 1921   02/17/18 2355  Basic metabolic panel  Tomorrow morning,   STAT     02/16/18 1921   02/17/18 0500  CBC  Tomorrow morning,   STAT     02/16/18 1921   02/16/18 1830  Urine Culture  Add-on,   AD     02/16/18 1830          Vitals/Pain Today's Vitals   02/16/18 1500 02/16/18 1600 02/16/18 1700 02/16/18 1800  BP: 119/77 123/62 (!) 129/59 129/61  Pulse: 69 72 71 71  Resp: 19 16 16 17   Temp:      TempSrc:      SpO2: 94% 95% 93% 94%  Weight:      Height:      PainSc:  Isolation Precautions No active isolations  Medications Medications  potassium chloride 10 mEq in 100 mL IVPB (10 mEq Intravenous New Bag/Given 02/16/18 2026)  0.9 % NaCl with KCl 40 mEq / L  infusion (has no administration in time range)  enoxaparin (LOVENOX) injection 40 mg (40 mg Subcutaneous Given 02/16/18 2026)  acetaminophen  (TYLENOL) tablet 650 mg (has no administration in time range)    Or  acetaminophen (TYLENOL) suppository 650 mg (has no administration in time range)  polyethylene glycol (MIRALAX / GLYCOLAX) packet 17 g (has no administration in time range)  ondansetron (ZOFRAN) tablet 4 mg (has no administration in time range)    Or  ondansetron (ZOFRAN) injection 4 mg (has no administration in time range)  albuterol (PROVENTIL) (2.5 MG/3ML) 0.083% nebulizer solution 2.5 mg (has no administration in time range)  ciprofloxacin (CIPRO) IVPB 200 mg (has no administration in time range)  sodium chloride 0.9 % bolus 1,000 mL (0 mLs Intravenous Stopped 02/16/18 1727)  ciprofloxacin (CIPRO) IVPB 400 mg (0 mg Intravenous Stopped 02/16/18 2016)  potassium chloride SA (K-DUR,KLOR-CON) CR tablet 40 mEq (40 mEq Oral Given 02/16/18 2027)    Mobility walks with device

## 2018-02-16 NOTE — Progress Notes (Signed)
Advance care planning  Purpose of Encounter UTI, dementia  Parties in Attendance Patient and 2 of her sons  Patients Decisional capacity Patient with dementia unable to make medical decisions.  Healthcare power of attorney is son Sentoria Brent  Discussed regarding patient's UTI and worsening dementia Patient has documented healthcare power of attorney Beacher May.  Advance directives in place but documentation not available at this time.  Son tells me that he would like her to be a full code till he can review the paperwork which he is going to bring in.  Orders entered for full code  Time spent - 17 minutes

## 2018-02-16 NOTE — H&P (Signed)
[facility] at [place] [name]    MR#:  [id number]  DATE OF BIRTH:  Nov 23, 1924  DATE OF ADMISSION:  02/16/2018  PRIMARY CARE PHYSICIAN: [name], MD   REQUESTING/REFERRING PHYSICIAN: Dr. Archie Balboa  CHIEF COMPLAINT:   Chief Complaint  Patient presents with  . Altered Mental Status    HISTORY OF PRESENT ILLNESS:  Andrea Schaefer  is a 85 y.o. female with a known history of chronic respiratory failure on 2 L oxygen, hypertension, dementia, ambulates with a walker, lives with son presents to the emergency room due to worsening weakness and confusion.  Patient here has been found to have a UTI.  Afebrile.  Normal WBC.  Has foul-smelling urine.  Patient is poor historian and unclear if she is having any dysuria.  Does have incontinence. Due to allergy profile was given IV ciprofloxacin in the ER and is being admitted.  PAST MEDICAL HISTORY:   Past Medical History:  Diagnosis Date  . Anxiety    unspecified  . Arthritis   . Cancer [facility]) 2011   Right  . Cataract cortical, senile   . Depression   . Gout   . History of breast cancer   . History of poliomyelitis    as a child  . History of rosacea   . Hyperlipidemia, unspecified   . Hypertension   . Hypothyroidism    unspecified  . Osteoporosis, post-menopausal   . Pericarditis   . Personal history of radiation therapy   . Renal insufficiency   . Seborrheic dermatitis   . Thyroid disease     PAST SURGICAL HISTORY:   Past Surgical History:  Procedure Laterality Date  . BACK SURGERY    . BASAL CELL CARCINOMA EXCISION Left    BCC left cheek  . BREAST BIOPSY Right 2011   +  . CARDIAC CATHETERIZATION  03/15/2005  . CATARACT EXTRACTION Left 2008  . FRACTURE SURGERY Left 05/17/2005   ORIF left distal radius fracture  . FRACTURE SURGERY Left 05/2005   left wrist  . FRACTURE SURGERY  04/2004   right wrist  . MASTECTOMY PARTIAL / LUMPECTOMY Right 2011   right breast     SOCIAL HISTORY:   Social History   Tobacco Use  . Smoking status: Never Smoker  . Smokeless tobacco: Never Used  Substance Use Topics  . Alcohol use: No    FAMILY HISTORY:   Family History  Problem Relation Age of Onset  . Hypertension Other   . Alzheimer's disease Mother   . Coronary artery disease Mother   . Hypertension Mother   . Lung cancer Father   . Alzheimer's disease Brother   . Breast cancer Neg Hx     DRUG ALLERGIES:   Allergies  Allergen Reactions  . Codeine Nausea And Vomiting  . Tetracyclines & Related Nausea And Vomiting  . Adhesive [Tape] Rash  . Keflex [Cephalexin] Rash  . Naproxen Rash    Patient was on both Keflex and Naproxen when developed rash -  unclear as to which caused rash    REVIEW OF SYSTEMS:   Review of Systems  Unable to perform ROS: Dementia    MEDICATIONS AT HOME:   Prior to Admission medications   Medication Sig Start Date End Date Taking? Authorizing Provider  acetaminophen (TYLENOL) 325 MG tablet Take 2 tablets (650 mg total) by mouth every 6 (six) hours as needed for mild pain, moderate pain or headache (headache). 09/11/15  Yes  Gladstone Lighter, MD  amLODipine (NORVASC) 10 MG tablet Take 1 tablet (10 mg total) by mouth daily. 09/18/14  Yes Demetrios Loll, MD  buPROPion (WELLBUTRIN XL) 150 MG 24 hr tablet Take 150 mg by mouth daily.   Yes [provider]  cholecalciferol (VITAMIN D) 1000 units tablet Take 2,000 Units by mouth daily.   Yes [provider]  docusate sodium (COLACE) 100 MG capsule Take 1 capsule (100 mg total) by mouth 2 (two) times daily. 02/10/17  Yes Wieting, Richard, MD  escitalopram (LEXAPRO) 10 MG tablet Take 10 mg by mouth daily.   Yes [provider]  hydrALAZINE (APRESOLINE) 25 MG tablet Take 1 tablet (25 mg total) by mouth 3 (three) times daily. 09/18/14  Yes Demetrios Loll, MD  ipratropium-albuterol (DUONEB) 0.5-2.5 (3) MG/3ML SOLN Take 3 mLs by nebulization 2 (two) times daily.  02/10/17  Yes Wieting, Richard, MD  levothyroxine (SYNTHROID, LEVOTHROID) 100 MCG tablet Take 100 mcg by mouth daily before breakfast.   Yes [provider]  metoprolol (LOPRESSOR) 50 MG tablet Take 1 tablet (50 mg total) by mouth 2 (two) times daily. 09/18/14  Yes Demetrios Loll, MD  polyethylene glycol [facility] Raveling) packet Take 17 g by mouth daily as needed for moderate constipation or severe constipation. 02/10/17  Yes Wieting, Richard, MD  potassium chloride (MICRO-K) 10 MEQ CR capsule Take 10 mEq by mouth daily.   Yes [provider]  simvastatin (ZOCOR) 20 MG tablet Take 20 mg by mouth at bedtime.    Yes [provider]     VITAL SIGNS:  Blood pressure 129/61, pulse 71, temperature 98 F (36.7 C), temperature source Oral, resp. rate 17, height 5\' 5"  (1.651 m), weight 90.7 kg, SpO2 94 %.  PHYSICAL EXAMINATION:  Physical Exam  GENERAL:  85 y.o.-year-old patient lying in the bed with no acute distress.  EYES: Pupils equal, round, reactive to light and accommodation. No scleral icterus. Extraocular muscles intact.  HEENT: Head atraumatic, normocephalic. Oropharynx and nasopharynx clear. No oropharyngeal erythema, moist oral mucosa  NECK:  Supple, no jugular venous distention. No thyroid enlargement, no tenderness.  LUNGS: Normal breath sounds bilaterally, no wheezing, rales, rhonchi. No use of accessory muscles of respiration.  CARDIOVASCULAR: S1, S2 normal. No murmurs, rubs, or gallops.  ABDOMEN: Soft, nontender, nondistended. Bowel sounds present. No organomegaly or mass.  EXTREMITIES: No pedal edema, cyanosis, or clubbing. + 2 pedal & radial pulses b/l.   NEUROLOGIC: Cranial nerves II through XII are intact. No focal Motor or sensory deficits appreciated b/l PSYCHIATRIC: The patient is alert and awake.  Oriented x2 SKIN: No obvious rash, lesion, or ulcer.   LABORATORY PANEL:   CBC Recent Labs  Lab 02/16/18 1504  WBC 8.2  HGB 12.7  HCT 41.3  PLT 336    ------------------------------------------------------------------------------------------------------------------  Chemistries  Recent Labs  Lab 02/16/18 1504  NA 138  K 2.9*  CL 99  CO2 30  GLUCOSE 122*  BUN 20  CREATININE 1.42*  CALCIUM 8.6*  AST 23  ALT 15  ALKPHOS 83  BILITOT 0.7   ------------------------------------------------------------------------------------------------------------------  Cardiac Enzymes No results for input(s): TROPONINI in the last 168 hours. ------------------------------------------------------------------------------------------------------------------  RADIOLOGY:  No results found.   IMPRESSION AND PLAN:   *UTI with acute metabolic encephalopathy.  Patient will be admitted for IV antibiotic treatment.  Has foul-smelling urine.  Unclear if she has dysuria or abdominal pain.  But no tenderness on examination.  We will continue IV ciprofloxacin started in the emergency  room.  Urine cultures pending.  *Hypokalemia.  Will replace orally and IV.  *Acute kidney injury over CKD stage III.  Will start IV fluids.  Repeat BMP in the morning.  Monitor input and output.  *Chronic respiratory failure is stable  *Dementia.  Monitor for inpatient delirium.  DVT prophylaxis with Lovenox  All the records are reviewed and case discussed with ED provider. Management plans discussed with the patient, family and they are in agreement.  CODE STATUS: Full code  TOTAL TIME TAKING CARE OF THIS PATIENT: 35 minutes.   Leia Alf Dennise Bamber M.D on 02/16/2018 at 7:22 PM  Between 7am to 6pm - Pager - [phone]  After 6pm go to [email or web address] - password [email or web address] [facility]  Office  [phone]  CC: Primary care physician; [name], MD  Note: This dictation was prepared with Dragon dictation along with smaller phrase technology. Any transcriptional errors that result from this process are unintentional.

## 2018-02-17 LAB — BASIC METABOLIC PANEL
Anion gap: 7 (ref 5–15)
BUN: 16 mg/dL (ref 8–23)
CO2: 28 mmol/L (ref 22–32)
Calcium: 8.2 mg/dL — ABNORMAL LOW (ref 8.9–10.3)
Chloride: 106 mmol/L (ref 98–111)
Creatinine, Ser: 1.17 mg/dL — ABNORMAL HIGH (ref 0.44–1.00)
GFR calc Af Amer: 47 mL/min — ABNORMAL LOW (ref >60)
GFR, EST NON AFRICAN AMERICAN: 41 mL/min — AB (ref >60)
Glucose, Bld: 88 mg/dL (ref 70–99)
Potassium: 3.7 mmol/L (ref 3.5–5.1)
Sodium: 141 mmol/L (ref 135–145)

## 2018-02-17 LAB — CBC
HCT: 38.1 % (ref 36.0–46.0)
HEMOGLOBIN: 11.6 g/dL — AB (ref 12.0–15.0)
MCH: 28.5 pg (ref 26.0–34.0)
MCHC: 30.4 g/dL (ref 30.0–36.0)
MCV: 93.6 fL (ref 80.0–100.0)
PLATELETS: 287 10*3/uL (ref 150–400)
RBC: 4.07 MIL/uL (ref 3.87–5.11)
RDW: 14.5 % (ref 11.5–15.5)
WBC: 7.3 10*3/uL (ref 4.0–10.5)
nRBC: 0 % (ref 0.0–0.2)

## 2018-02-17 MED ORDER — POTASSIUM CHLORIDE CRYS ER 10 MEQ PO TBCR
10.0000 meq | EXTENDED_RELEASE_TABLET | Freq: Every day | ORAL | Status: DC
  Administered 2018-02-17 – 2018-02-18 (×2): 10 meq via ORAL
  Filled 2018-02-17: qty 1

## 2018-02-17 MED ORDER — VITAMIN D3 25 MCG (1000 UNIT) PO TABS
2000.0000 [IU] | ORAL_TABLET | Freq: Every day | ORAL | Status: DC
  Administered 2018-02-17 – 2018-02-18 (×2): 2000 [IU] via ORAL
  Filled 2018-02-17 (×3): qty 2

## 2018-02-17 MED ORDER — ENOXAPARIN SODIUM 40 MG/0.4ML ~~LOC~~ SOLN
40.0000 mg | SUBCUTANEOUS | Status: DC
  Administered 2018-02-17: 22:00:00 40 mg via SUBCUTANEOUS
  Filled 2018-02-17: qty 0.4

## 2018-02-17 MED ORDER — METOPROLOL TARTRATE 50 MG PO TABS
50.0000 mg | ORAL_TABLET | Freq: Two times a day (BID) | ORAL | Status: DC
  Administered 2018-02-17 – 2018-02-18 (×3): 50 mg via ORAL
  Filled 2018-02-17 (×3): qty 1

## 2018-02-17 MED ORDER — SIMVASTATIN 20 MG PO TABS
20.0000 mg | ORAL_TABLET | Freq: Every day | ORAL | Status: DC
  Administered 2018-02-17: 22:00:00 20 mg via ORAL
  Filled 2018-02-17: qty 1

## 2018-02-17 MED ORDER — LEVOTHYROXINE SODIUM 100 MCG PO TABS
100.0000 ug | ORAL_TABLET | Freq: Every day | ORAL | Status: DC
  Administered 2018-02-18: 05:00:00 100 ug via ORAL
  Filled 2018-02-17: qty 1

## 2018-02-17 MED ORDER — BUPROPION HCL ER (XL) 150 MG PO TB24
150.0000 mg | ORAL_TABLET | Freq: Every day | ORAL | Status: DC
  Administered 2018-02-17 – 2018-02-18 (×2): 150 mg via ORAL
  Filled 2018-02-17 (×2): qty 1

## 2018-02-17 MED ORDER — ESCITALOPRAM OXALATE 10 MG PO TABS
10.0000 mg | ORAL_TABLET | Freq: Every day | ORAL | Status: DC
  Administered 2018-02-17 – 2018-02-18 (×2): 10 mg via ORAL
  Filled 2018-02-17 (×2): qty 1

## 2018-02-17 MED ORDER — HYDRALAZINE HCL 50 MG PO TABS
25.0000 mg | ORAL_TABLET | Freq: Three times a day (TID) | ORAL | Status: DC
  Administered 2018-02-17 – 2018-02-18 (×3): 25 mg via ORAL
  Filled 2018-02-17 (×3): qty 1

## 2018-02-17 NOTE — Progress Notes (Signed)
[facility] at [place] [name]    MR#:  [id number]  DATE OF BIRTH:  1925/02/09  SUBJECTIVE:   Patient admitted to the hospital secondary to altered mental status and noted to have urinary tract infection.  Mental status slightly improved since yesterday.  No other acute events overnight.  REVIEW OF SYSTEMS:    Review of Systems  Unable to perform ROS: Mental acuity    Nutrition: Regular Tolerating Diet: Yes Tolerating PT: Eval noted.   DRUG ALLERGIES:   Allergies  Allergen Reactions  . Codeine Nausea And Vomiting  . Tetracyclines & Related Nausea And Vomiting  . Adhesive [Tape] Rash  . Keflex [Cephalexin] Rash  . Naproxen Rash    Patient was on both Keflex and Naproxen when developed rash -  unclear as to which caused rash    VITALS:  Blood pressure (!) 127/57, pulse 73, temperature 97.8 F (36.6 C), temperature source Oral, resp. rate 20, height 5\' 5"  (1.651 m), weight 101.6 kg, SpO2 95 %.  PHYSICAL EXAMINATION:   Physical Exam  GENERAL:  85 y.o.-year-old patient sitting up in chair in no acute distress.  EYES: Pupils equal, round, reactive to light and accommodation. No scleral icterus. Extraocular muscles intact.  HEENT: Head atraumatic, normocephalic. Oropharynx and nasopharynx clear.  NECK:  Supple, no jugular venous distention. No thyroid enlargement, no tenderness.  LUNGS: Normal breath sounds bilaterally, no wheezing, rales, rhonchi. No use of accessory muscles of respiration.  CARDIOVASCULAR: S1, S2 normal. No murmurs, rubs, or gallops.  ABDOMEN: Soft, nontender, nondistended. Bowel sounds present. No organomegaly or mass.  EXTREMITIES: No cyanosis, clubbing or edema b/l.    NEUROLOGIC: Cranial nerves II through XII are intact. No focal Motor or sensory deficits b/l.   PSYCHIATRIC: The patient is alert and oriented x 2.  SKIN: No obvious rash, lesion, or ulcer.    LABORATORY PANEL:   CBC Recent Labs   Lab 02/17/18 0329  WBC 7.3  HGB 11.6*  HCT 38.1  PLT 287   ------------------------------------------------------------------------------------------------------------------  Chemistries  Recent Labs  Lab 02/16/18 1504 02/17/18 0329  NA 138 141  K 2.9* 3.7  CL 99 106  CO2 30 28  GLUCOSE 122* 88  BUN 20 16  CREATININE 1.42* 1.17*  CALCIUM 8.6* 8.2*  MG 2.2  --   AST 23  --   ALT 15  --   ALKPHOS 83  --   BILITOT 0.7  --    ------------------------------------------------------------------------------------------------------------------  Cardiac Enzymes No results for input(s): TROPONINI in the last 168 hours. ------------------------------------------------------------------------------------------------------------------  RADIOLOGY:  No results found.   ASSESSMENT AND PLAN:   85 year old female with past medical history of hypertension, hypothyroidism, osteoporosis, gout, depression, anxiety who presented to the hospital for altered mental status.  1.  Altered mental status/metabolic encephalopathy secondary to UTI. - Improved since yesterday.  Patient is more alert and awake.  Continue IV antibiotics for the UTI.  2.  Urinary tract infection-based off a urinalysis on admission.  Continue IV ciprofloxacin, follow urine cultures.  3.  Hypokalemia-supplemented and has improved. -Continue potassium supplements.  4.  Essential hypertension-continue metoprolol, hydralazine.  5.  Hypothyroidism-continue Synthroid.  6.  Hyperlipidemia-continue simvastatin.  7.  Anxiety/depression- continue Lexapro, Wellbutrin.  Appreciate PT eval and likely discharge tomorrow with home health services.  All the records are reviewed and case discussed with Care Management/Social Worker. Management plans discussed with the patient, family and they are in agreement.  CODE STATUS: Full code  DVT Prophylaxis: Lovenox  TOTAL TIME TAKING CARE OF THIS PATIENT: 30 minutes.    POSSIBLE D/C IN 1-2 DAYS, DEPENDING ON CLINICAL CONDITION.   Henreitta Leber M.D on 02/17/2018 at 12:56 PM  Between 7am to 6pm - Pager - [phone]  After 6pm go to [email or web address] - Proofreader  [facility]  Office  [phone]  CC: Primary care physician; [name], MD

## 2018-02-17 NOTE — Evaluation (Signed)
Physical Therapy Evaluation Patient Details [name] MRN: [id number] DOB: 1924/02/22 Today's Date: 02/17/2018   History of Present Illness  Patient is a 85 year old female admitted with UTI, AMS. Patient has PMH to include dementia, O2, HTN   Clinical Impression  Patient alert, lethargic lying in bed upon arrival. Agrees to limited amount of mobility. Patient performed bed mobility without assistance using rails. Transferred sit to stand without assist, but close supervision. Patient agrees to get up to recliner but not walk more. Requires use of RW and close supervision due to history of falls. Performed long sitting exercises to include AP, and hip abd/add x 10 reps. No assist needed but patient fatigued after these. Patient will benefit from continued PT while here to improve mobility and strength for return home.       Follow Up Recommendations Home health PT    Equipment Recommendations  None recommended by PT    Recommendations for Other Services       Precautions / Restrictions Precautions Precautions: Fall Restrictions Weight Bearing Restrictions: No      Mobility  Bed Mobility Overal bed mobility: Independent                Transfers Overall transfer level: Modified independent Equipment used: Rolling walker (2 wheeled)             General transfer comment: patient transfers without physical assist.  Ambulation/Gait Ambulation/Gait assistance: Modified independent (Device/Increase time);Supervision Gait Distance (Feet): 5 Feet Assistive device: Rolling walker (2 wheeled) Gait Pattern/deviations: Step-to pattern Gait velocity: decreased      Stairs            Wheelchair Mobility    Modified Rankin (Stroke Patients Only)       Balance Overall balance assessment: Independent   Sitting balance-Leahy Scale: Good     Standing balance support: Bilateral upper extremity supported Standing balance-Leahy Scale: Good Standing  balance comment: requires use of AD for balance                             Pertinent Vitals/Pain Pain Assessment: No/denies pain    Home Living Family/patient expects to be discharged to:: Private residence Living Arrangements: Children Available Help at Discharge: Family Type of Home: House Home Access: Stairs to enter   Technical brewer of Steps: 1 Home Layout: One level Home Equipment: Environmental consultant - 2 wheels;Cane - single point;Grab bars - tub/shower      Prior Function Level of Independence: Needs assistance;Independent with assistive device(s)   Gait / Transfers Assistance Needed: Patient ambulates with RW at baseline. Lives with son who is able to assist as needed           Hand Dominance        Extremity/Trunk Assessment   Upper Extremity Assessment Upper Extremity Assessment: Overall WFL for tasks assessed    Lower Extremity Assessment Lower Extremity Assessment: Overall WFL for tasks assessed       Communication   Communication: No difficulties  Cognition Arousal/Alertness: Awake/alert Behavior During Therapy: WFL for tasks assessed/performed Overall Cognitive Status: Within Functional Limits for tasks assessed                                        General Comments      Exercises Total Joint Exercises Ankle Circles/Pumps: AROM;10 reps Hip ABduction/ADduction:  AROM;10 reps;Both   Assessment/Plan    PT Assessment Patient needs continued PT services  PT Problem List Decreased strength;Decreased mobility;Decreased activity tolerance;Decreased balance       PT Treatment Interventions Gait training;Functional mobility training;Therapeutic activities;Balance training;Patient/family education;Therapeutic exercise    PT Goals (Current goals can be found in the Care Plan section)  Acute Rehab PT Goals Patient Stated Goal: to return home PT Goal Formulation: With patient Time For Goal Achievement: 03/03/18 Potential  to Achieve Goals: Good    Frequency Min 2X/week   Barriers to discharge        Co-evaluation               AM-PAC PT "6 Clicks" Mobility  Outcome Measure Help needed turning from your back to your side while in a flat bed without using bedrails?: A Little Help needed moving from lying on your back to sitting on the side of a flat bed without using bedrails?: A Little Help needed moving to and from a bed to a chair (including a wheelchair)?: A Little Help needed standing up from a chair using your arms (e.g., wheelchair or bedside chair)?: A Little Help needed to walk in hospital room?: A Little Help needed climbing 3-5 steps with a railing? : A Lot 6 Click Score: 17    End of Session Equipment Utilized During Treatment: Gait belt Activity Tolerance: Patient tolerated treatment well;Patient limited by fatigue Patient left: in chair;with chair alarm set Nurse Communication: Mobility status PT Visit Diagnosis: Muscle weakness (generalized) (M62.81);History of falling (Z91.81)    Time: 1000-1020 PT Time Calculation (min) (ACUTE ONLY): 20 min   Charges:   PT Evaluation $PT Eval Low Complexity: 1 Low PT Treatments $Therapeutic Activity: 8-22 mins        Kendallyn Lippold, PT, GCS 02/17/18,10:34 AM

## 2018-02-18 LAB — BASIC METABOLIC PANEL
Anion gap: 4 — ABNORMAL LOW (ref 5–15)
BUN: 11 mg/dL (ref 8–23)
CO2: 29 mmol/L (ref 22–32)
Calcium: 8.9 mg/dL (ref 8.9–10.3)
Chloride: 104 mmol/L (ref 98–111)
Creatinine, Ser: 1.14 mg/dL — ABNORMAL HIGH (ref 0.44–1.00)
GFR calc Af Amer: 49 mL/min — ABNORMAL LOW (ref >60)
GFR calc non Af Amer: 42 mL/min — ABNORMAL LOW (ref >60)
Glucose, Bld: 121 mg/dL — ABNORMAL HIGH (ref 70–99)
Potassium: 4.6 mmol/L (ref 3.5–5.1)
Sodium: 137 mmol/L (ref 135–145)

## 2018-02-18 MED ORDER — SODIUM CHLORIDE 0.9 % IV SOLN
INTRAVENOUS | Status: DC | PRN
  Administered 2018-02-18: 05:00:00 250 mL via INTRAVENOUS

## 2018-02-18 MED ORDER — CIPROFLOXACIN HCL 250 MG PO TABS: 250.0000 mg | ORAL_TABLET | Freq: Two times a day (BID) | ORAL | 0 refills | Status: AC

## 2018-02-18 NOTE — Discharge Summary (Signed)
[facility] [place] [name]    MR#:  [id number]  DATE OF BIRTH:  [birth year]  DATE OF ADMISSION:  02/16/2018 ADMITTING PHYSICIAN: Hillary Bow, MD  DATE OF DISCHARGE: No discharge date for patient encounter.  PRIMARY CARE PHYSICIAN: [name], MD    ADMISSION DIAGNOSIS:  Lower urinary tract infectious disease [N39.0] Altered mental status, unspecified altered mental status type [R41.82]  DISCHARGE DIAGNOSIS:  Active Problems:   UTI (urinary tract infection)   SECONDARY DIAGNOSIS:   Past Medical History:  Diagnosis Date  . Anxiety    unspecified  . Arthritis   . Cancer [facility]) 2011   Right  . Cataract cortical, senile   . Depression   . Gout   . History of breast cancer   . History of poliomyelitis    as a child  . History of rosacea   . Hyperlipidemia, unspecified   . Hypertension   . Hypothyroidism    unspecified  . Osteoporosis, post-menopausal   . Pericarditis   . Personal history of radiation therapy   . Renal insufficiency   . Seborrheic dermatitis   . Thyroid disease     HOSPITAL COURSE:   85 year old female with past medical history of hypertension, hypothyroidism, osteoporosis, gout, depression, anxiety who presented to the hospital for altered mental status.  1.  Altered mental status/metabolic encephalopathy secondary to UTI. Patient was treated with IV ciprofloxacin for UTI and given some IV fluids.  Mental status much improved and close to baseline now.  2.  Urinary tract infection-based off a urinalysis on admission.    Patient's urine cultures are positive for 100,000 colonies of gram-negative rod but it has not been identified.  Patient has clinically improved with IV ciprofloxacin.  I will discharge her on a 3-day course of oral Cipro.  She is afebrile and hemodynamically stable.  3.  Hypokalemia-supplemented and resolved.   4.  Essential hypertension- pt. Will continue metoprolol,  hydralazine.  5.  Hypothyroidism-pt. Will continue Synthroid.  6.  Hyperlipidemia-pt. Will continue simvastatin.  7.  Anxiety/depression- pt. Will continue Lexapro, Wellbutrin.  Pt. will be discharged with home health nursing, PT  DISCHARGE CONDITIONS:   Stable.   CONSULTS OBTAINED:    DRUG ALLERGIES:   Allergies  Allergen Reactions  . Codeine Nausea And Vomiting  . Tetracyclines & Related Nausea And Vomiting  . Adhesive [Tape] Rash  . Keflex [Cephalexin] Rash  . Naproxen Rash    Patient was on both Keflex and Naproxen when developed rash -  unclear as to which caused rash    DISCHARGE MEDICATIONS:   Allergies as of 02/18/2018      Reactions   Codeine Nausea And Vomiting   Tetracyclines & Related Nausea And Vomiting   Adhesive [tape] Rash   Keflex [cephalexin] Rash   Naproxen Rash   Patient was on both Keflex and Naproxen when developed rash -  unclear as to which caused rash      Medication List    TAKE these medications   acetaminophen 325 MG tablet Commonly known as:  TYLENOL Take 2 tablets (650 mg total) by mouth every 6 (six) hours as needed for mild pain, moderate pain or headache (headache).   amLODipine 10 MG tablet Commonly known as:  NORVASC Take 1 tablet (10 mg total) by mouth daily.   buPROPion 150 MG 24 hr tablet Commonly known as:  WELLBUTRIN XL Take 150 mg by mouth daily.   cholecalciferol 25  MCG (1000 UT) tablet Commonly known as:  VITAMIN D Take 2,000 Units by mouth daily.   ciprofloxacin 250 MG tablet Commonly known as:  CIPRO Take 1 tablet (250 mg total) by mouth 2 (two) times daily for 3 days.   docusate sodium 100 MG capsule Commonly known as:  COLACE Take 1 capsule (100 mg total) by mouth 2 (two) times daily.   escitalopram 10 MG tablet Commonly known as:  LEXAPRO Take 10 mg by mouth daily.   hydrALAZINE 25 MG tablet Commonly known as:  APRESOLINE Take 1 tablet (25 mg total) by mouth 3 (three) times daily.    ipratropium-albuterol 0.5-2.5 (3) MG/3ML Soln Commonly known as:  DUONEB Take 3 mLs by nebulization 2 (two) times daily.   levothyroxine 100 MCG tablet Commonly known as:  SYNTHROID, LEVOTHROID Take 100 mcg by mouth daily before breakfast.   metoprolol tartrate 50 MG tablet Commonly known as:  LOPRESSOR Take 1 tablet (50 mg total) by mouth 2 (two) times daily.   polyethylene glycol packet Commonly known as:  MIRALAX / GLYCOLAX Take 17 g by mouth daily as needed for moderate constipation or severe constipation.   potassium chloride 10 MEQ CR capsule Commonly known as:  MICRO-K Take 10 mEq by mouth daily.   simvastatin 20 MG tablet Commonly known as:  ZOCOR Take 20 mg by mouth at bedtime.         DISCHARGE INSTRUCTIONS:   DIET:  Cardiac diet  DISCHARGE CONDITION:  Stable  ACTIVITY:  Activity as tolerated  OXYGEN:  Home Oxygen: Yes.     Oxygen Delivery: 2 liters/min via Patient connected to nasal cannula oxygen  DISCHARGE LOCATION:  Home with Home health PT, RN.    If you experience worsening of your admission symptoms, develop shortness of breath, life threatening emergency, suicidal or homicidal thoughts you must seek medical attention immediately by calling 911 or calling your MD immediately  if symptoms less severe.  You Must read complete instructions/literature along with all the possible adverse reactions/side effects for all the Medicines you take and that have been prescribed to you. Take any new Medicines after you have completely understood and accpet all the possible adverse reactions/side effects.   Please note  You were cared for by a hospitalist during your hospital stay. If you have any questions about your discharge medications or the care you received while you were in the hospital after you are discharged, you can call the unit and asked to speak with the hospitalist on call if the hospitalist that took care of you is not available. Once you are  discharged, your primary care physician will handle any further medical issues. Please note that NO REFILLS for any discharge medications will be authorized once you are discharged, as it is imperative that you return to your primary care physician (or establish a relationship with a primary care physician if you do not have one) for your aftercare needs so that they can reassess your need for medications and monitor your lab values.     Today   No acute events overnight.  Mental status improved and close to baseline now.  Urine cultures are positive for gram-negative rods but not identified yet.  Patient has improved with ciprofloxacin and I will discharge her on that.  Discussed plan of care with patient's son over the phone and he is in agreement.  VITAL SIGNS:  Blood pressure (!) 132/55, pulse 69, temperature 97.8 F (36.6 C), temperature source Oral, resp. rate  18, height 5\' 5"  (1.651 m), weight 104.3 kg, SpO2 94 %.  I/O:    Intake/Output Summary (Last 24 hours) at 02/18/2018 1234 Last data filed at 02/18/2018 1104 Gross per 24 hour  Intake 501.36 ml  Output 725 ml  Net -223.64 ml    PHYSICAL EXAMINATION:   GENERAL:  85 y.o.-year-old patient sitting up in chair in no acute distress.  EYES: Pupils equal, round, reactive to light and accommodation. No scleral icterus. Extraocular muscles intact.  HEENT: Head atraumatic, normocephalic. Oropharynx and nasopharynx clear.  NECK:  Supple, no jugular venous distention. No thyroid enlargement, no tenderness.  LUNGS: Normal breath sounds bilaterally, no wheezing, rales, rhonchi. No use of accessory muscles of respiration.  CARDIOVASCULAR: S1, S2 normal. No murmurs, rubs, or gallops.  ABDOMEN: Soft, nontender, nondistended. Bowel sounds present. No organomegaly or mass.  EXTREMITIES: No cyanosis, clubbing or edema b/l.    NEUROLOGIC: Cranial nerves II through XII are intact. No focal Motor or sensory deficits b/l.   PSYCHIATRIC: The patient  is alert and oriented x 2.  SKIN: No obvious rash, lesion, or ulcer.   DATA REVIEW:   CBC Recent Labs  Lab 02/17/18 0329  WBC 7.3  HGB 11.6*  HCT 38.1  PLT 287    Chemistries  Recent Labs  Lab 02/16/18 1504  02/18/18 0741  NA 138   < > 137  K 2.9*   < > 4.6  CL 99   < > 104  CO2 30   < > 29  GLUCOSE 122*   < > 121*  BUN 20   < > 11  CREATININE 1.42*   < > 1.14*  CALCIUM 8.6*   < > 8.9  MG 2.2  --   --   AST 23  --   --   ALT 15  --   --   ALKPHOS 83  --   --   BILITOT 0.7  --   --    < > = values in this interval not displayed.    Cardiac Enzymes No results for input(s): TROPONINI in the last 168 hours.  Microbiology Results  Results for orders placed or performed during the hospital encounter of 02/16/18  Urine Culture     Status: Abnormal (Preliminary result)   Collection Time: 02/16/18  4:00 PM  Result Value Ref Range Status   Specimen Description   Final    URINE, RANDOM Performed at [facility], [address], [place], [place] [address]    Special Requests   Final    NONE Performed at [facility], [address] [place],  [address]    Culture >=100,000 COLONIES/mL ESCHERICHIA COLI (A)  Final   Report Status PENDING  Incomplete    RADIOLOGY:  No results found.    Management plans discussed with the patient, family and they are in agreement.  CODE STATUS:     Code Status Orders  (From admission, onward)         Start     Ordered   02/16/18 1921  Full code  Continuous     02/16/18 1921          TOTAL TIME TAKING CARE OF THIS PATIENT: 40 minutes.    Henreitta Leber M.D on 02/18/2018 at 12:34 PM  Between 7am to 6pm - Pager - [phone]  After 6pm go to [email or web address] - [employer] [facility]  Office  [phone]  CC: Primary care physician; Lovie Macadamia,  Shanon Brow, MD

## 2018-02-18 NOTE — Care Management Note (Signed)
Case Management Note  Patient Details  [name] MRN: [id number] Date of Birth: 1925-01-04  Subjective/Objective:  Patient to be discharged per MD order. Orders in place for home health services. Spoke primarily with son since patient has dementia and UTI. CMS Medicare.gov Compare Post Acute Care list reviewed with son and since they have chronic oxygen through [facility] they prefer to use them for services. Referral placed with Brad who accepts referral. Patient has walker and chronic O2 in the home. Son to bring portable tank. Son for transport.                     Action/Plan:   Expected Discharge Date:  02/18/18               Expected Discharge Plan:  [place]  In-House Referral:     Discharge planning Services  CM Consult  Post Acute Care Choice:  Home Health Choice offered to:  Adult Children, Patient  DME Arranged:    DME Agency:     HH Arranged:  RN, PT [place] Agency:  [facility]  Status of Service:  Completed, signed off  If discussed at [place] of Stay Meetings, dates discussed:    Additional Comments:  Kathyrn Drown Anshu Wehner, RN 02/18/2018, 11:04 AM

## 2018-02-18 NOTE — Progress Notes (Signed)
AVS printed out and reviewed with patient and 2 sons. Printed RX with AVS packet, family and patient verbalized understanding.

## 2018-02-19 LAB — URINE CULTURE: Culture: >=100000 — AB

## 2018-02-21 ENCOUNTER — Telehealth: Payer: Self-pay

## 2018-02-21 NOTE — Telephone Encounter (Signed)
EMMI Follow-up: Noted on the report that the patient was sure if she received her discharge papers, didn't know who to call if there was a change in her condition and wasn't sure if she was taking medications. RN CM noted that patient has dementia and they he talked with the son during her hospital stay. I talked with Andrea Schaefer and she didn't understand what I was talking about and asked her son, Andrea Schaefer to pick up on the other line.  I explained to him why I was calling and provided him with Dr. Reuel Boom phone number to make a follow-up appointment and they could contact him if she has any changes in her condition.  He said she was taking her medication. I let them know there would be a second automated call with a different series of questions and to let us know if they had any other concerns at that time.

## 2018-02-26 ENCOUNTER — Telehealth: Payer: Self-pay

## 2018-02-26 NOTE — Telephone Encounter (Signed)
EMMI flagged for feeling sad and hopeless. CSW contacted patient regarding concerns. Patient answered phone and CSW explained reason for call. Patient is confused and states that she does not remember any automated calls. CSW assessed patient's current mood and asked if she has had any periods of sadness or feeling hopeless. Patient states that she has not and that she "feels fine". CSW asked to speak with patient's family but she states that she is the only one home. Patient denies any SI/HI at this time.   [place], [place]

## 2019-05-02 ENCOUNTER — Inpatient Hospital Stay
Admission: EM | Admit: 2019-05-02 | Discharge: 2019-05-10 | DRG: 291 | Disposition: A | Discharge status: Skilled Nursing Facility | Payer: Medicare Other | Attending: Internal Medicine | Admitting: Internal Medicine

## 2019-05-02 ENCOUNTER — Emergency Department: Payer: Medicare Other

## 2019-05-02 ENCOUNTER — Other Ambulatory Visit: Payer: Self-pay

## 2019-05-02 DIAGNOSIS — E785 Hyperlipidemia, unspecified: Secondary | ICD-10-CM | POA: Diagnosis present

## 2019-05-02 DIAGNOSIS — E876 Hypokalemia: Secondary | ICD-10-CM | POA: Diagnosis not present

## 2019-05-02 DIAGNOSIS — J90 Pleural effusion, not elsewhere classified: Secondary | ICD-10-CM

## 2019-05-02 DIAGNOSIS — Z23 Encounter for immunization: Secondary | ICD-10-CM | POA: Diagnosis not present

## 2019-05-02 DIAGNOSIS — Z79890 Hormone replacement therapy (postmenopausal): Secondary | ICD-10-CM

## 2019-05-02 DIAGNOSIS — Z853 Personal history of malignant neoplasm of breast: Secondary | ICD-10-CM | POA: Diagnosis not present

## 2019-05-02 DIAGNOSIS — J440 Chronic obstructive pulmonary disease with acute lower respiratory infection: Secondary | ICD-10-CM | POA: Diagnosis present

## 2019-05-02 DIAGNOSIS — M810 Age-related osteoporosis without current pathological fracture: Secondary | ICD-10-CM | POA: Diagnosis present

## 2019-05-02 DIAGNOSIS — J9621 Acute and chronic respiratory failure with hypoxia: Secondary | ICD-10-CM | POA: Diagnosis present

## 2019-05-02 DIAGNOSIS — M109 Gout, unspecified: Secondary | ICD-10-CM | POA: Diagnosis present

## 2019-05-02 DIAGNOSIS — I5031 Acute diastolic (congestive) heart failure: Secondary | ICD-10-CM | POA: Diagnosis not present

## 2019-05-02 DIAGNOSIS — J9601 Acute respiratory failure with hypoxia: Secondary | ICD-10-CM | POA: Diagnosis present

## 2019-05-02 DIAGNOSIS — Z85828 Personal history of other malignant neoplasm of skin: Secondary | ICD-10-CM | POA: Diagnosis not present

## 2019-05-02 DIAGNOSIS — Z8612 Personal history of poliomyelitis: Secondary | ICD-10-CM

## 2019-05-02 DIAGNOSIS — Z79899 Other long term (current) drug therapy: Secondary | ICD-10-CM

## 2019-05-02 DIAGNOSIS — Z923 Personal history of irradiation: Secondary | ICD-10-CM | POA: Diagnosis not present

## 2019-05-02 DIAGNOSIS — I110 Hypertensive heart disease with heart failure: Secondary | ICD-10-CM | POA: Diagnosis present

## 2019-05-02 DIAGNOSIS — I272 Pulmonary hypertension, unspecified: Secondary | ICD-10-CM | POA: Diagnosis present

## 2019-05-02 DIAGNOSIS — J181 Lobar pneumonia, unspecified organism: Secondary | ICD-10-CM

## 2019-05-02 DIAGNOSIS — F418 Other specified anxiety disorders: Secondary | ICD-10-CM | POA: Diagnosis present

## 2019-05-02 DIAGNOSIS — Z9011 Acquired absence of right breast and nipple: Secondary | ICD-10-CM

## 2019-05-02 DIAGNOSIS — M25552 Pain in left hip: Secondary | ICD-10-CM | POA: Diagnosis not present

## 2019-05-02 DIAGNOSIS — Z9981 Dependence on supplemental oxygen: Secondary | ICD-10-CM | POA: Diagnosis not present

## 2019-05-02 DIAGNOSIS — F419 Anxiety disorder, unspecified: Secondary | ICD-10-CM | POA: Diagnosis not present

## 2019-05-02 DIAGNOSIS — F329 Major depressive disorder, single episode, unspecified: Secondary | ICD-10-CM | POA: Diagnosis not present

## 2019-05-02 DIAGNOSIS — I5033 Acute on chronic diastolic (congestive) heart failure: Secondary | ICD-10-CM | POA: Diagnosis present

## 2019-05-02 DIAGNOSIS — Z09 Encounter for follow-up examination after completed treatment for conditions other than malignant neoplasm: Secondary | ICD-10-CM

## 2019-05-02 DIAGNOSIS — J449 Chronic obstructive pulmonary disease, unspecified: Secondary | ICD-10-CM

## 2019-05-02 DIAGNOSIS — J918 Pleural effusion in other conditions classified elsewhere: Secondary | ICD-10-CM | POA: Diagnosis present

## 2019-05-02 DIAGNOSIS — Z938 Other artificial opening status: Secondary | ICD-10-CM

## 2019-05-02 DIAGNOSIS — Z20822 Contact with and (suspected) exposure to covid-19: Secondary | ICD-10-CM | POA: Diagnosis present

## 2019-05-02 DIAGNOSIS — N179 Acute kidney failure, unspecified: Secondary | ICD-10-CM | POA: Diagnosis present

## 2019-05-02 DIAGNOSIS — N3000 Acute cystitis without hematuria: Secondary | ICD-10-CM | POA: Diagnosis not present

## 2019-05-02 DIAGNOSIS — I10 Essential (primary) hypertension: Secondary | ICD-10-CM

## 2019-05-02 DIAGNOSIS — J9600 Acute respiratory failure, unspecified whether with hypoxia or hypercapnia: Secondary | ICD-10-CM

## 2019-05-02 DIAGNOSIS — R410 Disorientation, unspecified: Secondary | ICD-10-CM

## 2019-05-02 DIAGNOSIS — I509 Heart failure, unspecified: Secondary | ICD-10-CM | POA: Diagnosis not present

## 2019-05-02 DIAGNOSIS — F32A Depression, unspecified: Secondary | ICD-10-CM

## 2019-05-02 DIAGNOSIS — R52 Pain, unspecified: Secondary | ICD-10-CM

## 2019-05-02 DIAGNOSIS — E039 Hypothyroidism, unspecified: Secondary | ICD-10-CM | POA: Diagnosis not present

## 2019-05-02 DIAGNOSIS — R0602 Shortness of breath: Secondary | ICD-10-CM | POA: Diagnosis present

## 2019-05-02 DIAGNOSIS — G4700 Insomnia, unspecified: Secondary | ICD-10-CM | POA: Diagnosis present

## 2019-05-02 LAB — CBC WITH DIFFERENTIAL/PLATELET
Abs Immature Granulocytes: 0.07 10*3/uL (ref 0.00–0.07)
Basophils Absolute: 0 10*3/uL (ref 0.0–0.1)
Basophils Relative: 1 %
Eosinophils Absolute: 0.1 10*3/uL (ref 0.0–0.5)
Eosinophils Relative: 2 %
HCT: 43.3 % (ref 36.0–46.0)
Hemoglobin: 13.1 g/dL (ref 12.0–15.0)
Immature Granulocytes: 1 %
Lymphocytes Relative: 13 %
Lymphs Abs: 1.1 10*3/uL (ref 0.7–4.0)
MCH: 28.9 pg (ref 26.0–34.0)
MCHC: 30.3 g/dL (ref 30.0–36.0)
MCV: 95.6 fL (ref 80.0–100.0)
Monocytes Absolute: 0.6 10*3/uL (ref 0.1–1.0)
Monocytes Relative: 7 %
Neutro Abs: 6.8 10*3/uL (ref 1.7–7.7)
Neutrophils Relative %: 76 %
Platelets: 267 10*3/uL (ref 150–400)
RBC: 4.53 MIL/uL (ref 3.87–5.11)
RDW: 17.1 % — ABNORMAL HIGH (ref 11.5–15.5)
WBC: 8.7 10*3/uL (ref 4.0–10.5)
nRBC: 0 % (ref 0.0–0.2)

## 2019-05-02 LAB — CBC
HCT: 40.1 % (ref 36.0–46.0)
Hemoglobin: 12 g/dL (ref 12.0–15.0)
MCH: 28.6 pg (ref 26.0–34.0)
MCHC: 29.9 g/dL — ABNORMAL LOW (ref 30.0–36.0)
MCV: 95.7 fL (ref 80.0–100.0)
Platelets: 247 10*3/uL (ref 150–400)
RBC: 4.19 MIL/uL (ref 3.87–5.11)
RDW: 16.9 % — ABNORMAL HIGH (ref 11.5–15.5)
WBC: 9.8 10*3/uL (ref 4.0–10.5)
nRBC: 0 % (ref 0.0–0.2)

## 2019-05-02 LAB — COMPREHENSIVE METABOLIC PANEL
ALT: 12 U/L (ref 0–44)
AST: 16 U/L (ref 15–41)
Albumin: 3.7 g/dL (ref 3.5–5.0)
Alkaline Phosphatase: 79 U/L (ref 38–126)
Anion gap: 8 (ref 5–15)
BUN: 15 mg/dL (ref 8–23)
CO2: 29 mmol/L (ref 22–32)
Calcium: 9.2 mg/dL (ref 8.9–10.3)
Chloride: 103 mmol/L (ref 98–111)
Creatinine, Ser: 1.07 mg/dL — ABNORMAL HIGH (ref 0.44–1.00)
GFR calc Af Amer: 52 mL/min — ABNORMAL LOW (ref >60)
GFR calc non Af Amer: 45 mL/min — ABNORMAL LOW (ref >60)
Glucose, Bld: 120 mg/dL — ABNORMAL HIGH (ref 70–99)
Potassium: 3.7 mmol/L (ref 3.5–5.1)
Sodium: 140 mmol/L (ref 135–145)
Total Bilirubin: 0.9 mg/dL (ref 0.3–1.2)
Total Protein: 7.5 g/dL (ref 6.5–8.1)

## 2019-05-02 LAB — AMYLASE, PLEURAL OR PERITONEAL FLUID: Amylase, Fluid: 16 U/L

## 2019-05-02 LAB — PROCALCITONIN: Procalcitonin: <0.1 ng/mL

## 2019-05-02 LAB — BLOOD GAS, VENOUS
Acid-Base Excess: 7.4 mmol/L — ABNORMAL HIGH (ref 0.0–2.0)
Bicarbonate: 35 mmol/L — ABNORMAL HIGH (ref 20.0–28.0)
O2 Saturation: 89 %
Patient temperature: 37
pCO2, Ven: 62 mmHg — ABNORMAL HIGH (ref 44.0–60.0)
pH, Ven: 7.36 (ref 7.250–7.430)
pO2, Ven: 59 mmHg — ABNORMAL HIGH (ref 32.0–45.0)

## 2019-05-02 LAB — BODY FLUID CELL COUNT WITH DIFFERENTIAL
Eos, Fluid: 1 %
Lymphs, Fluid: 91 %
Monocyte-Macrophage-Serous Fluid: 4 %
Neutrophil Count, Fluid: 4 %
Total Nucleated Cell Count, Fluid: 202 cu mm

## 2019-05-02 LAB — PROTEIN, PLEURAL OR PERITONEAL FLUID: Total protein, fluid: <3 g/dL

## 2019-05-02 LAB — RESPIRATORY PANEL BY RT PCR (FLU A&B, COVID)
Influenza A by PCR: NEGATIVE
Influenza B by PCR: NEGATIVE
SARS Coronavirus 2 by RT PCR: NEGATIVE

## 2019-05-02 LAB — CREATININE, SERUM
Creatinine, Ser: 1.05 mg/dL — ABNORMAL HIGH (ref 0.44–1.00)
GFR calc Af Amer: 53 mL/min — ABNORMAL LOW (ref >60)
GFR calc non Af Amer: 46 mL/min — ABNORMAL LOW (ref >60)

## 2019-05-02 LAB — GLUCOSE, PLEURAL OR PERITONEAL FLUID: Glucose, Fluid: 117 mg/dL

## 2019-05-02 LAB — BRAIN NATRIURETIC PEPTIDE: B Natriuretic Peptide: 469 pg/mL — ABNORMAL HIGH (ref 0.0–100.0)

## 2019-05-02 LAB — LACTIC ACID, PLASMA: Lactic Acid, Venous: 0.7 mmol/L (ref 0.5–1.9)

## 2019-05-02 LAB — LACTATE DEHYDROGENASE, PLEURAL OR PERITONEAL FLUID: LD, Fluid: 59 U/L — ABNORMAL HIGH (ref 3–23)

## 2019-05-02 LAB — TROPONIN I (HIGH SENSITIVITY)
Troponin I (High Sensitivity): 9 ng/L (ref <18)
Troponin I (High Sensitivity): 7 ng/L (ref <18)

## 2019-05-02 MED ORDER — LEVOTHYROXINE SODIUM 25 MCG PO TABS
125.0000 ug | ORAL_TABLET | Freq: Every day | ORAL | Status: DC
  Administered 2019-05-03 – 2019-05-10 (×6): 125 ug via ORAL
  Filled 2019-05-02 (×6): qty 1
  Filled 2019-05-02: qty 3
  Filled 2019-05-02: qty 1

## 2019-05-02 MED ORDER — VANCOMYCIN HCL IN DEXTROSE 1-5 GM/200ML-% IV SOLN
1000.0000 mg | Freq: Once | INTRAVENOUS | Status: AC
  Administered 2019-05-02: 1000 mg via INTRAVENOUS
  Filled 2019-05-02: qty 200

## 2019-05-02 MED ORDER — ESCITALOPRAM OXALATE 10 MG PO TABS
20.0000 mg | ORAL_TABLET | Freq: Every day | ORAL | Status: DC
  Administered 2019-05-03 – 2019-05-10 (×8): 20 mg via ORAL
  Filled 2019-05-02 (×8): qty 2

## 2019-05-02 MED ORDER — LIDOCAINE-EPINEPHRINE 2 %-1:100000 IJ SOLN
20.0000 mL | Freq: Once | INTRAMUSCULAR | Status: AC
  Administered 2019-05-02: 20 mL
  Filled 2019-05-02: qty 1

## 2019-05-02 MED ORDER — BUPROPION HCL ER (XL) 150 MG PO TB24
150.0000 mg | ORAL_TABLET | Freq: Every day | ORAL | Status: DC
  Administered 2019-05-03 – 2019-05-10 (×8): 150 mg via ORAL
  Filled 2019-05-02 (×10): qty 1

## 2019-05-02 MED ORDER — METOPROLOL TARTRATE 50 MG PO TABS
50.0000 mg | ORAL_TABLET | Freq: Two times a day (BID) | ORAL | Status: DC
  Administered 2019-05-03 – 2019-05-10 (×14): 50 mg via ORAL
  Filled 2019-05-02 (×16): qty 1

## 2019-05-02 MED ORDER — ATORVASTATIN CALCIUM 20 MG PO TABS
20.0000 mg | ORAL_TABLET | Freq: Every day | ORAL | Status: DC
  Administered 2019-05-03 – 2019-05-10 (×8): 20 mg via ORAL
  Filled 2019-05-02 (×9): qty 1

## 2019-05-02 MED ORDER — ENOXAPARIN SODIUM 40 MG/0.4ML ~~LOC~~ SOLN
40.0000 mg | SUBCUTANEOUS | Status: DC
  Administered 2019-05-02 – 2019-05-09 (×8): 40 mg via SUBCUTANEOUS
  Filled 2019-05-02 (×8): qty 0.4

## 2019-05-02 MED ORDER — SODIUM CHLORIDE 0.9 % IV SOLN
1.0000 g | Freq: Once | INTRAVENOUS | Status: AC
  Administered 2019-05-02: 1 g via INTRAVENOUS
  Filled 2019-05-02 (×2): qty 1

## 2019-05-02 NOTE — ED Notes (Signed)
Son at bedside.

## 2019-05-02 NOTE — H&P (Signed)
History and Physical    Andrea Schaefer [id number] DOB: 1924-04-27 DOA: 05/02/2019  PCP: [name], MD   Patient coming from: home I have personally briefly reviewed patient's old medical records in [place]  Chief Complaint: shortness of breath  HPI: Andrea Schaefer is a 86 y.o. female with medical history significant for HTN, hypothyroidism, depression and anxiety, on home O2 at 2.5 L/min for the past 2 years, with no documented underlying pulmonary condition, who was brought into the emergency room with shortness of breath and difficulty breathing.  Most of the history is taken from her son at the bedside who stated that his mother was in her usual state of health, until about a week ago when he noticed that she sounded short of breath.  He said he called her every day and noticed that she had been becoming increasingly short winded to the point where she would hardly get up out of bed.  At baseline she is ambulant with a walker.  He said she fell out of the bed about 2 weeks prior and still has a bruise on her left arm, but otherwise no complaints of pain resulting from the fall.  She has had no fever or cough or congestion.  No complaints of chest pain, abdominal pain, GI or GU symptoms.  On arrival of EMS she was satting at 86% on room air and was placed on 15 L nonrebreather  ED Course: On arrival in the emergency room she was noted to have labored breathing.  O2 sat 96% on NRB with otherwise stable vitals.  Her chest x-ray returned showing a large left pleural effusion with compressive atelectasis.  She subsequently underwent chest tube placement with return of 1 L of serosanguineous pleural fluid.  Fluid was sent for analysis and results are currently pending.  Otherwise on her work-up, white cell count was normal at 8700, hemoglobin 13.1, troponin 9>>7, BNP 469, procalcitonin less than 0.1.  Chest x-ray postprocedure shows small residual left pleural effusion, small right pleural  effusion, cardiomegaly with vascular congestion and pulmonary edema.  Hospitalist consulted for admission.  EKG had shown sinus rhythm.  Specific ST-T wave changes  Review of Systems: As per HPI otherwise 10 point review of systems negative.    Past Medical History:  Diagnosis Date  . Anxiety    unspecified  . Arthritis   . Cancer [facility]) 2011   Right  . Cataract cortical, senile   . Depression   . Gout   . History of breast cancer   . History of poliomyelitis    as a child  . History of rosacea   . Hyperlipidemia, unspecified   . Hypertension   . Hypothyroidism    unspecified  . Osteoporosis, post-menopausal   . Pericarditis   . Personal history of radiation therapy   . Renal insufficiency   . Seborrheic dermatitis   . Thyroid disease     Past Surgical History:  Procedure Laterality Date  . BACK SURGERY    . BASAL CELL CARCINOMA EXCISION Left    BCC left cheek  . BREAST BIOPSY Right 2011   +  . CARDIAC CATHETERIZATION  03/15/2005  . CATARACT EXTRACTION Left 2008  . FRACTURE SURGERY Left 05/17/2005   ORIF left distal radius fracture  . FRACTURE SURGERY Left 05/2005   left wrist  . FRACTURE SURGERY  04/2004   right wrist  . MASTECTOMY PARTIAL / LUMPECTOMY Right 2011   right breast  reports that she has never smoked. She has never used smokeless tobacco. She reports that she does not drink alcohol or use drugs.  Allergies  Allergen Reactions  . Codeine Nausea And Vomiting  . Tetracyclines & Related Nausea And Vomiting  . Adhesive [Tape] Rash  . Keflex [Cephalexin] Rash  . Naproxen Rash    Patient was on both Keflex and Naproxen when developed rash -  unclear as to which caused rash    Family History  Problem Relation Age of Onset  . Hypertension Other   . Alzheimer's disease Mother   . Coronary artery disease Mother   . Hypertension Mother   . Lung cancer Father   . Alzheimer's disease Brother   . Breast cancer Neg Hx      Prior to Admission  medications   Medication Sig Start Date End Date Taking? Authorizing Provider  acetaminophen (TYLENOL) 325 MG tablet Take 2 tablets (650 mg total) by mouth every 6 (six) hours as needed for mild pain, moderate pain or headache (headache). 09/11/15  Yes Gladstone Lighter, MD  amLODipine (NORVASC) 10 MG tablet Take 1 tablet (10 mg total) by mouth daily. 09/18/14  Yes Demetrios Loll, MD  atorvastatin (LIPITOR) 20 MG tablet Take 20 mg by mouth daily.   Yes [provider]  buPROPion (WELLBUTRIN XL) 150 MG 24 hr tablet Take 150 mg by mouth daily.   Yes [provider]  cholecalciferol (VITAMIN D) 1000 units tablet Take 2,000 Units by mouth daily.   Yes [provider]  escitalopram (LEXAPRO) 20 MG tablet Take 20 mg by mouth daily.    Yes [provider]  hydrALAZINE (APRESOLINE) 25 MG tablet Take 1 tablet (25 mg total) by mouth 3 (three) times daily. 09/18/14  Yes Demetrios Loll, MD  levothyroxine (SYNTHROID) 125 MCG tablet Take 125 mcg by mouth daily before breakfast.    Yes [provider]  metoprolol (LOPRESSOR) 50 MG tablet Take 1 tablet (50 mg total) by mouth 2 (two) times daily. 09/18/14  Yes Demetrios Loll, MD  polyethylene glycol [facility] Floria Raveling) packet Take 17 g by mouth daily as needed for moderate constipation or severe constipation. 02/10/17  Yes Wieting, Richard, MD  potassium chloride (MICRO-K) 10 MEQ CR capsule Take 10 mEq by mouth daily.   Yes [provider]    Physical Exam: Vitals:   05/02/19 2000 05/02/19 2030 05/02/19 2040 05/02/19 2100  BP: 131/71 (!) 151/67  111/82  Pulse: 65 69 67 61  Resp: 19 (!) 21 20 18   Temp:      TempSrc:      SpO2: 95% 93% 99% 99%  Weight:      Height:         Vitals:   05/02/19 2000 05/02/19 2030 05/02/19 2040 05/02/19 2100  BP: 131/71 (!) 151/67  111/82  Pulse: 65 69 67 61  Resp: 19 (!) 21 20 18   Temp:      TempSrc:      SpO2: 95% 93% 99% 99%  Weight:      Height:        Constitutional: Alert  and awake, oriented x3, not in any acute distress. Eyes: PERLA, EOMI, irises appear normal, anicteric sclera,  ENMT: external ears and nose appear normal, normal hearing             Lips appears normal, oropharynx mucosa, tongue, posterior pharynx appear normal  Neck: neck appears normal, no masses, normal ROM, no thyromegaly, no JVD  CVS: S1-S2  clear, no murmur rubs or gallops,  , no carotid bruits, pedal pulses palpable, 1+ pitting edema bilaterally Respiratory: Diminished bilaterally, no wheezing, rales or rhonchi. Respiratory effort normal.  Chest tube left chest draining serosanguineous fluid Abdomen: soft nontender, nondistended, normal bowel sounds, no hepatosplenomegaly, no hernias Musculoskeletal: : no cyanosis, clubbing , no contractures or atrophy Neuro: Cranial nerves II-XII intact, sensation, reflexes normal, strength Psych: judgement and insight appear normal, stable mood and affect,  Skin: no rashes or lesions or ulcers, no induration or nodules   Labs on Admission: I have personally reviewed following labs and imaging studies  CBC: Recent Labs  Lab 05/02/19 1634  WBC 8.7  NEUTROABS 6.8  HGB 13.1  HCT 43.3  MCV 95.6  PLT 99991111   Basic Metabolic Panel: Recent Labs  Lab 05/02/19 1634  NA 140  K 3.7  CL 103  CO2 29  GLUCOSE 120*  BUN 15  CREATININE 1.07*  CALCIUM 9.2   GFR: Estimated Creatinine Clearance: 37.3 mL/min (A) (by C-G formula based on SCr of 1.07 mg/dL (H)). Liver Function Tests: Recent Labs  Lab 05/02/19 1634  AST 16  ALT 12  ALKPHOS 79  BILITOT 0.9  PROT 7.5  ALBUMIN 3.7   No results for input(s): LIPASE, AMYLASE in the last 168 hours. No results for input(s): AMMONIA in the last 168 hours. Coagulation Profile: No results for input(s): INR, PROTIME in the last 168 hours. Cardiac Enzymes: No results for input(s): CKTOTAL, CKMB, CKMBINDEX, TROPONINI in the last 168 hours. BNP (last 3 results) No results for input(s): PROBNP in the last  8760 hours. HbA1C: No results for input(s): HGBA1C in the last 72 hours. CBG: No results for input(s): GLUCAP in the last 168 hours. Lipid Profile: No results for input(s): CHOL, HDL, LDLCALC, TRIG, CHOLHDL, LDLDIRECT in the last 72 hours. Thyroid Function Tests: No results for input(s): TSH, T4TOTAL, FREET4, T3FREE, THYROIDAB in the last 72 hours. Anemia Panel: No results for input(s): VITAMINB12, FOLATE, FERRITIN, TIBC, IRON, RETICCTPCT in the last 72 hours. Urine analysis:    Component Value Date/Time   COLORURINE AMBER (A) 02/16/2018 1600   APPEARANCEUR CLOUDY (A) 02/16/2018 1600   APPEARANCEUR Clear 02/20/2014 1910   LABSPEC 1.014 02/16/2018 1600   LABSPEC 1.004 02/20/2014 1910   PHURINE 5.0 02/16/2018 1600   GLUCOSEU NEGATIVE 02/16/2018 1600   GLUCOSEU Negative 02/20/2014 1910   HGBUR NEGATIVE 02/16/2018 1600   BILIRUBINUR NEGATIVE 02/16/2018 1600   BILIRUBINUR Negative 02/20/2014 1910   KETONESUR NEGATIVE 02/16/2018 1600   PROTEINUR NEGATIVE 02/16/2018 1600   NITRITE NEGATIVE 02/16/2018 1600   LEUKOCYTESUR LARGE (A) 02/16/2018 1600   LEUKOCYTESUR 1+ 02/20/2014 1910    Radiological Exams on Admission: DG Chest Portable 1 View  Result Date: 05/02/2019 CLINICAL DATA:  Post chest tube placement EXAM: PORTABLE CHEST 1 VIEW COMPARISON:  05/02/2019, 02/06/2017 FINDINGS: Interim placement of left lower chest tube with the tip projecting over the central left lower lung. Significant decrease in left pleural effusion with small residual. Small right-sided pleural effusion. Enlarged cardiomediastinal silhouette with vascular congestion. Hazy and interstitial opacities likely due to edema. Residual partial atelectasis at the left base. No pneumothorax. Aortic atherosclerosis. IMPRESSION: 1. Placement of left-sided chest tube with decreased left pleural effusion and improved aeration of left thorax. Small residual left pleural effusion and partial atelectasis at the left base. 2. Small  right pleural effusion with platelike atelectasis at the right base 3. Cardiomegaly with vascular congestion and pulmonary edema Electronically Signed   By:  Donavan Foil M.D.   On: 05/02/2019 21:19   DG Chest Portable 1 View  Result Date: 05/02/2019 CLINICAL DATA:  Respiratory distress. EXAM: PORTABLE CHEST 1 VIEW COMPARISON:  02/06/2017 FINDINGS: Lungs are hypoinflated with near complete opacification of the left lung with minimal aeration over the left upper lobe. Patchy hazy opacification of the right lung. Minimal blunting of the right costophrenic angle. Left heart border is obscured. Remainder of the exam is unchanged. IMPRESSION: Findings likely due to large left effusion with associated atelectasis. Underlying mass or infection is possible. Patchy hazy opacification over the right lung which may be due to edema or infection. Possible small amount right pleural fluid. Electronically Signed   By: Marin Olp M.D.   On: 05/02/2019 16:37    EKG: Independently reviewed.   Assessment/Plan    Acute on chronic respiratory failure with hypoxia (HCC)   Pleural effusion on left   S/P thoracostomy tube placement [facility]) -Patient presents in acute respiratory distress following a 1 week history of progressively worsening shortness of breath.  Wears O2 at 2.5 L/min at home for unclear reasons -Patient had emergent placement of thoracostomy tube in the ER with symptomatic improvement -Continue supplemental O2 to keep sats over 94% -Etiology of pleural effusion uncertain, but given pulmonary vascular congestion on postthoracotomy placement, as well as BNP of 469, possibility includes CHF.   -Follow pleural fluid analysis to assist with diagnosis, pneumonia and malignancy among the differential -Echocardiogram in the a.m. to assess left ventricular systolic function -Troponins have been negative so low suspicion for ACS -Surgical consult for thoracotomy tube management    Essential  hypertension -Continue metoprolol.  Will hold home amlodipine and hydralazine for now   acquired hypothyroidism -Continue levothyroxine    Anxiety and depression -Continue home antidepressants and anxiolytics     DVT prophylaxis: Lovenox  Code Status: full code  Family Communication:  Son, Bexlie Merrick Disposition Plan: Back to previous home environment Consults called: surgery, Status:inp    Athena Masse MD [facility]     05/02/2019, 9:32 PM

## 2019-05-02 NOTE — ED Notes (Signed)
MD and this nurse at bedside for chest tube placement

## 2019-05-02 NOTE — ED Notes (Signed)
Pt cleaned of incontinence. Purwick applied

## 2019-05-02 NOTE — ED Triage Notes (Signed)
Pt arrives via EMS from home after having difficulty breathing and low O2 sat- pt normally wears 2.5L O2 at home but was still satting 86%- pt arrives on 15L non rebreather and satting at 91%- per EMS other VSS

## 2019-05-02 NOTE — Progress Notes (Signed)
PHARMACY -  BRIEF ANTIBIOTIC NOTE   Pharmacy has received consult(s) for Meropenem and Vancomycin from an ED provider.  The patient's profile has been reviewed for ht/wt/allergies/indication/available labs.    One time order(s) placed by ED MD for Meropenem 1 gram and Vancomycin 1 gram  Further antibiotics/pharmacy consults should be ordered by admitting physician if indicated.                       Thank you, Anaaya Fuster A 05/02/2019  4:43 PM

## 2019-05-02 NOTE — Progress Notes (Signed)
CODE SEPSIS - PHARMACY COMMUNICATION  **Broad Spectrum Antibiotics should be administered within 1 hour of Sepsis diagnosis**  Time Code Sepsis Called/Page Received: 3/18 1638  Antibiotics Ordered: Vanc, meropenem  Time of 1st antibiotic administration: 1716  Additional action taken by pharmacy: messaged RN about timing of abx   If necessary, Name of Provider/Nurse Contacted:      Noralee Space ,PharmD Clinical Pharmacist  05/02/2019  7:10 PM

## 2019-05-02 NOTE — ED Notes (Signed)
Pt attempted to be transitioned back to [place] at 6LPM instead of NRB. When placed on  pts spO2 decreased to 87%. Pt was placed back on NRB

## 2019-05-02 NOTE — ED Provider Notes (Signed)
[facility] Emergency Department Provider Note  ____________________________________________  Time seen: Approximately 8:52 PM  I have reviewed the triage vital signs and the nursing notes.   HISTORY  Chief Complaint Shortness of Breath    Level 5 Caveat: Portions of the History and Physical including HPI and review of systems are unable to be completely obtained due to patient acute respiratory distress  HPI Andrea Schaefer is a 86 y.o. female with a history of anxiety, COPD on chronic 2.5 L nasal cannula at home  who comes the ED complaining of severe shortness of breath, gradual onset 2 days ago, worsening.  No aggravating or alleviating factors.  No fever or cough.  No chest pain.  When interviewed, patient only replies "I cannot breathe."     Past Medical History:  Diagnosis Date  . Anxiety    unspecified  . Arthritis   . Cancer [employer]) 2011   Right  . Cataract cortical, senile   . Depression   . Gout   . History of breast cancer   . History of poliomyelitis    as a child  . History of rosacea   . Hyperlipidemia, unspecified   . Hypertension   . Hypothyroidism    unspecified  . Osteoporosis, post-menopausal   . Pericarditis   . Personal history of radiation therapy   . Renal insufficiency   . Seborrheic dermatitis   . Thyroid disease      Patient Active Problem List   Diagnosis Date Noted  . UTI (urinary tract infection) 02/16/2018  . Chronic obstructive pulmonary disease with hypoxia ([place]) 03/07/2017  . Peripheral edema 03/06/2017  . Generalized weakness 03/06/2017  . Hypoxia 02/06/2017  . Pneumonia 09/08/2015  . Compression fracture of L3 lumbar vertebra 09/12/2014  . Intractable back pain 09/12/2014     Past Surgical History:  Procedure Laterality Date  . BACK SURGERY    . BASAL CELL CARCINOMA EXCISION Left    BCC left cheek  . BREAST BIOPSY Right 2011   +  . CARDIAC CATHETERIZATION  03/15/2005  . CATARACT EXTRACTION Left  2008  . FRACTURE SURGERY Left 05/17/2005   ORIF left distal radius fracture  . FRACTURE SURGERY Left 05/2005   left wrist  . FRACTURE SURGERY  04/2004   right wrist  . MASTECTOMY PARTIAL / LUMPECTOMY Right 2011   right breast     Prior to Admission medications   Medication Sig Start Date End Date Taking? Authorizing Provider  acetaminophen (TYLENOL) 325 MG tablet Take 2 tablets (650 mg total) by mouth every 6 (six) hours as needed for mild pain, moderate pain or headache (headache). 09/11/15  Yes Gladstone Lighter, MD  amLODipine (NORVASC) 10 MG tablet Take 1 tablet (10 mg total) by mouth daily. 09/18/14  Yes Demetrios Loll, MD  atorvastatin (LIPITOR) 20 MG tablet Take 20 mg by mouth daily.   Yes [provider]  buPROPion (WELLBUTRIN XL) 150 MG 24 hr tablet Take 150 mg by mouth daily.   Yes [provider]  cholecalciferol (VITAMIN D) 1000 units tablet Take 2,000 Units by mouth daily.   Yes [provider]  escitalopram (LEXAPRO) 20 MG tablet Take 20 mg by mouth daily.    Yes [provider]  hydrALAZINE (APRESOLINE) 25 MG tablet Take 1 tablet (25 mg total) by mouth 3 (three) times daily. 09/18/14  Yes Demetrios Loll, MD  levothyroxine (SYNTHROID) 125 MCG tablet Take 125 mcg by mouth daily before breakfast.    Yes  [provider]  metoprolol (LOPRESSOR) 50 MG tablet Take 1 tablet (50 mg total) by mouth 2 (two) times daily. 09/18/14  Yes Demetrios Loll, MD  polyethylene glycol [facility] / Floria Raveling) packet Take 17 g by mouth daily as needed for moderate constipation or severe constipation. 02/10/17  Yes Wieting, Richard, MD  potassium chloride (MICRO-K) 10 MEQ CR capsule Take 10 mEq by mouth daily.   Yes [provider]     Allergies Codeine, Tetracyclines & related, Adhesive [tape], Keflex [cephalexin], and Naproxen   Family History  Problem Relation Age of Onset  . Hypertension Other   . Alzheimer's disease Mother   . Coronary artery disease  Mother   . Hypertension Mother   . Lung cancer Father   . Alzheimer's disease Brother   . Breast cancer Neg Hx     Social History Social History   Tobacco Use  . Smoking status: Never Smoker  . Smokeless tobacco: Never Used  Substance Use Topics  . Alcohol use: No  . Drug use: No    Review of Systems Level 5 Caveat: Portions of the History and Physical including HPI and review of systems are unable to be completely obtained due to patient being a poor historian   Constitutional:   No known fever.  ENT:   No rhinorrhea. Cardiovascular:   No chest pain or syncope. Respiratory:   Positive shortness of breath without cough. Gastrointestinal:   Negative for abdominal pain, vomiting and diarrhea.  Musculoskeletal:   Negative for focal pain or swelling ____________________________________________   PHYSICAL EXAM:  VITAL SIGNS: ED Triage Vitals  Enc Vitals Group     BP 05/02/19 1624 (!) 155/66     Pulse Rate 05/02/19 1655 66     Resp 05/02/19 1624 (!) 26     Temp 05/02/19 1655 (!) 97.4 F (36.3 C)     Temp Source 05/02/19 1655 Oral     SpO2 05/02/19 1624 92 %     Weight 05/02/19 1625 200 lb (90.7 kg)     Height 05/02/19 1625 '5\' 5"'$  (1.651 m)     Head Circumference --      Peak Flow --      Pain Score 05/02/19 1625 0     Pain Loc --      Pain Edu? --      Excl. in [place]? --     Vital signs reviewed, nursing assessments reviewed.   Constitutional:   Alert and oriented.  Ill-appearing. Eyes:   Conjunctivae are normal. EOMI. PERRL. ENT      Head:   Normocephalic and atraumatic.      Nose:   No congestion/rhinnorhea.       Mouth/Throat:   MMM, no pharyngeal erythema. No peritonsillar mass.       Neck:   No meningismus. Full ROM. Hematological/Lymphatic/Immunilogical:   No cervical lymphadenopathy. Cardiovascular:   RRR. Symmetric bilateral radial and DP pulses.  No murmurs. Cap refill less than 2 seconds. Respiratory:   Tachypnea, respiratory rate of about 26.  Absent  breath sounds on the left.  Normal breath sounds on the right.. Gastrointestinal:   Soft and nontender. Non distended. There is no CVA tenderness.  No rebound, rigidity, or guarding. Musculoskeletal:   Normal range of motion in all extremities. No joint effusions.  No lower extremity tenderness.  2+ pitting edema bilateral lower extremities Neurologic:   Normal speech and language.  Motor grossly intact. No acute focal neurologic deficits are appreciated.  Skin:  Skin is warm, dry and intact. No rash noted.  No petechiae, purpura, or bullae.  ____________________________________________    LABS (pertinent positives/negatives) (all labs ordered are listed, but only abnormal results are displayed) Labs Reviewed  COMPREHENSIVE METABOLIC PANEL - Abnormal; Notable for the following components:      Result Value   Glucose, Bld 120 (*)    Creatinine, Ser 1.07 (*)    GFR calc non Af Amer 45 (*)    GFR calc Af Amer 52 (*)    All other components within normal limits  BRAIN NATRIURETIC PEPTIDE - Abnormal; Notable for the following components:   B Natriuretic Peptide 469.0 (*)    All other components within normal limits  CBC WITH DIFFERENTIAL/PLATELET - Abnormal; Notable for the following components:   RDW 17.1 (*)    All other components within normal limits  BLOOD GAS, VENOUS - Abnormal; Notable for the following components:   pCO2, Ven 62 (*)    pO2, Ven 59.0 (*)    Bicarbonate 35.0 (*)    Acid-Base Excess 7.4 (*)    All other components within normal limits  RESPIRATORY PANEL BY RT PCR (FLU A&B, COVID)  CULTURE, BLOOD (SINGLE)  URINE CULTURE  CULTURE, BLOOD (SINGLE)  BODY FLUID CULTURE  LACTIC ACID, PLASMA  PROCALCITONIN  URINALYSIS, COMPLETE (UACMP) WITH MICROSCOPIC  GLUCOSE, PLEURAL OR PERITONEAL FLUID  PROTEIN, PLEURAL OR PERITONEAL FLUID  LACTATE DEHYDROGENASE, PLEURAL OR PERITONEAL FLUID  AMYLASE, PLEURAL OR PERITONEAL FLUID   PH, BODY FLUID  BODY FLUID  CELL COUNT WITH DIFFERENTIAL  CYTOLOGY - NON PAP  TROPONIN I (HIGH SENSITIVITY)  TROPONIN I (HIGH SENSITIVITY)   ____________________________________________   EKG  Interpreted by me Sinus rhythm rate of 68, left axis, normal intervals.  Poor R wave progression.  No acute ischemic changes.  Severely wandering baseline limits interpretation.  ____________________________________________    ZOXWRUEAV  DG Chest Portable 1 View  Result Date: 05/02/2019 CLINICAL DATA:  Respiratory distress. EXAM: PORTABLE CHEST 1 VIEW COMPARISON:  02/06/2017 FINDINGS: Lungs are hypoinflated with near complete opacification of the left lung with minimal aeration over the left upper lobe. Patchy hazy opacification of the right lung. Minimal blunting of the right costophrenic angle. Left heart border is obscured. Remainder of the exam is unchanged. IMPRESSION: Findings likely due to large left effusion with associated atelectasis. Underlying mass or infection is possible. Patchy hazy opacification over the right lung which may be due to edema or infection. Possible small amount right pleural fluid. Electronically Signed   By: Marin Olp M.D.   On: 05/02/2019 16:37    ____________________________________________   PROCEDURES CHEST TUBE INSERTION  Date/Time: 05/02/2019 8:52 PM Performed by: Carrie Mew, MD Authorized by: Carrie Mew, MD   Consent:    Consent obtained:  Written   Consent given by: son.   Risks discussed:  Bleeding, incomplete drainage, infection, nerve damage, pain and damage to surrounding structures   Alternatives discussed:  Delayed treatment and referral Pre-procedure details:    Skin preparation:  Alcohol and ChloraPrep   Preparation: Patient was prepped and draped in the usual sterile fashion   Anesthesia (see MAR for exact dosages):    Anesthesia method:  Local infiltration   Local anesthetic:  Lidocaine 2% WITH epi Procedure details:    Placement location:  L  lateral   Scalpel size:  11   Tube size (Fr):  20   Dissection instrument:  Kelly clamp   Ultrasound guidance: yes     Tension  pneumothorax: no     Tube connected to:  Suction   Drainage characteristics:  Serosanguinous   Suture material:  0 silk   Dressing:  Petrolatum-impregnated gauze and 4x4 sterile gauze Post-procedure details:    Patient tolerance of procedure:  Tolerated well, no immediate complications Comments:     Prior to insertion, O2 sat was 92% on nonrebreather.  After insertion, O2 sat was 100% on nonrebreather with presence of breath sounds in the left upper chest on auscultation.    .Critical Care Performed by: Carrie Mew, MD Authorized by: Carrie Mew, MD   Critical care provider statement:    Critical care time (minutes):  35   Critical care time was exclusive of:  Separately billable procedures and treating other patients   Critical care was necessary to treat or prevent imminent or life-threatening deterioration of the following conditions:  Respiratory failure   Critical care was time spent personally by me on the following activities:  Development of treatment plan with patient or surrogate, discussions with consultants, evaluation of patient's response to treatment, examination of patient, obtaining history from patient or surrogate, ordering and performing treatments and interventions, ordering and review of laboratory studies, ordering and review of radiographic studies, pulse oximetry, re-evaluation of patient's condition and review of old charts    ____________________________________________  DIFFERENTIAL DIAGNOSIS   Pneumonia, pleural effusion, malignancy, pneumothorax  CLINICAL IMPRESSION / ASSESSMENT AND PLAN / ED COURSE  Medications ordered in the ED: Medications  vancomycin (VANCOCIN) IVPB 1000 mg/200 mL premix (0 mg Intravenous Stopped 05/02/19 1819)  meropenem (MERREM) 1 g in sodium chloride 0.9 % 100 mL IVPB (0 g Intravenous  Stopped 05/02/19 2003)  lidocaine-EPINEPHrine (XYLOCAINE W/EPI) 2 %-1:100000 (with pres) injection 20 mL (20 mLs Infiltration Given 05/02/19 2023)    Pertinent labs & imaging results that were available during my care of the patient were reviewed by me and considered in my medical decision making (see chart for details).   Andrea Schaefer was evaluated in Emergency Department on 05/02/2019 for the symptoms described in the history of present illness. She was evaluated in the context of the global COVID-19 pandemic, which necessitated consideration that the patient might be at risk for infection with the SARS-CoV-2 virus that causes COVID-19. Institutional protocols and algorithms that pertain to the evaluation of patients at risk for COVID-19 are in a state of rapid change based on information released by regulatory bodies including the CDC and federal and state organizations. These policies and algorithms were followed during the patient's care in the ED.   Patient presents with respiratory distress, requiring nonrebreather oxygen at 15 L to maintain oxygen saturation of 92 to 94%.  Patient's tachypnea, increased work of breathing, absent breath sounds on the left.  No signs of tension pneumothorax on exam.  Stat chest x-ray obtained which shows a large left pleural effusion.  Concern for underlying pneumonia, with tachypnea and hypoxia, met sepsis criteria.  Code sepsis was initially called, empiric vancomycin and meropenem ordered.  Clinical Course as of May 01 2099  Thu May 02, 2019  1716 D/w son who provides verbal consent over the phone for thoracostomy tube placement to drain large pleural effusion. He notes that he is on his way to the hospital and will arrive in about an hour. Will wait for his arrival unless patient's condition deteriorates   [PS]    Clinical Course User Index [PS] Carrie Mew, MD    ----------------------------------------- 8:59 PM on  05/02/2019 -----------------------------------------  Procedure  completed uneventfully.  We will follow-up chest x-ray.  Procalcitonin is negative, lactate is normal, white blood cell count is normal, no fever.  Lab panel is all reassuring, and not consistent with pneumonia or other underlying infection.  Will cancel code sepsis.  Symptomatically patient is improving and lung exam is improving after chest tube insertion.  Will titrate down oxygen and plan to admit for further evaluation of pleural effusion and chest tube management.   ____________________________________________   FINAL CLINICAL IMPRESSION(S) / ED DIAGNOSES    Final diagnoses:  Acute on chronic respiratory failure with hypoxia (HCC)  Pleural effusion  Chronic obstructive pulmonary disease, unspecified COPD type [facility])     ED Discharge Orders    None      Portions of this note were generated with dragon dictation software. Dictation errors may occur despite best attempts at proofreading.   Carrie Mew, MD 05/02/19 2103

## 2019-05-03 ENCOUNTER — Inpatient Hospital Stay (HOSPITAL_COMMUNITY)
Admit: 2019-05-03 | Discharge: 2019-05-03 | Disposition: A | Discharge status: Home / Self Care | Payer: Medicare Other | Attending: Internal Medicine | Admitting: Internal Medicine

## 2019-05-03 DIAGNOSIS — E785 Hyperlipidemia, unspecified: Secondary | ICD-10-CM

## 2019-05-03 DIAGNOSIS — I5031 Acute diastolic (congestive) heart failure: Secondary | ICD-10-CM

## 2019-05-03 DIAGNOSIS — I509 Heart failure, unspecified: Secondary | ICD-10-CM

## 2019-05-03 DIAGNOSIS — E039 Hypothyroidism, unspecified: Secondary | ICD-10-CM

## 2019-05-03 DIAGNOSIS — N3000 Acute cystitis without hematuria: Secondary | ICD-10-CM

## 2019-05-03 DIAGNOSIS — F329 Major depressive disorder, single episode, unspecified: Secondary | ICD-10-CM

## 2019-05-03 DIAGNOSIS — F419 Anxiety disorder, unspecified: Secondary | ICD-10-CM

## 2019-05-03 LAB — URINALYSIS, COMPLETE (UACMP) WITH MICROSCOPIC
Bilirubin Urine: NEGATIVE
Glucose, UA: NEGATIVE mg/dL
Hgb urine dipstick: NEGATIVE
Ketones, ur: 5 mg/dL — AB
Nitrite: POSITIVE — AB
Protein, ur: 100 mg/dL — AB
Specific Gravity, Urine: 1.023 (ref 1.005–1.030)
WBC, UA: >50 WBC/hpf — ABNORMAL HIGH (ref 0–5)
pH: 5 (ref 5.0–8.0)

## 2019-05-03 LAB — ECHOCARDIOGRAM COMPLETE
Height: 65 in
Weight: 3200 oz

## 2019-05-03 LAB — BASIC METABOLIC PANEL
Anion gap: 8 (ref 5–15)
BUN: 15 mg/dL (ref 8–23)
CO2: 29 mmol/L (ref 22–32)
Calcium: 8.8 mg/dL — ABNORMAL LOW (ref 8.9–10.3)
Chloride: 105 mmol/L (ref 98–111)
Creatinine, Ser: 0.97 mg/dL (ref 0.44–1.00)
GFR calc Af Amer: 59 mL/min — ABNORMAL LOW (ref >60)
GFR calc non Af Amer: 51 mL/min — ABNORMAL LOW (ref >60)
Glucose, Bld: 91 mg/dL (ref 70–99)
Potassium: 3.7 mmol/L (ref 3.5–5.1)
Sodium: 142 mmol/L (ref 135–145)

## 2019-05-03 LAB — CBC
HCT: 40.6 % (ref 36.0–46.0)
Hemoglobin: 12.2 g/dL (ref 12.0–15.0)
MCH: 28.8 pg (ref 26.0–34.0)
MCHC: 30 g/dL (ref 30.0–36.0)
MCV: 96 fL (ref 80.0–100.0)
Platelets: 241 10*3/uL (ref 150–400)
RBC: 4.23 MIL/uL (ref 3.87–5.11)
RDW: 16.9 % — ABNORMAL HIGH (ref 11.5–15.5)
WBC: 12.3 10*3/uL — ABNORMAL HIGH (ref 4.0–10.5)
nRBC: 0 % (ref 0.0–0.2)

## 2019-05-03 LAB — GLUCOSE, CAPILLARY: Glucose-Capillary: 85 mg/dL (ref 70–99)

## 2019-05-03 LAB — PATHOLOGIST SMEAR REVIEW

## 2019-05-03 LAB — MRSA PCR SCREENING: MRSA by PCR: NEGATIVE

## 2019-05-03 MED ORDER — AZITHROMYCIN 250 MG PO TABS
250.0000 mg | ORAL_TABLET | Freq: Every day | ORAL | Status: AC
  Administered 2019-05-04 – 2019-05-07 (×4): 250 mg via ORAL
  Filled 2019-05-03 (×4): qty 1

## 2019-05-03 MED ORDER — FUROSEMIDE 10 MG/ML IJ SOLN: 20.0000 mg | Freq: Two times a day (BID) | INTRAMUSCULAR | Status: DC

## 2019-05-03 MED ORDER — IPRATROPIUM-ALBUTEROL 0.5-2.5 (3) MG/3ML IN SOLN: 3.0000 mL | Freq: Four times a day (QID) | RESPIRATORY_TRACT | Status: DC | PRN

## 2019-05-03 MED ORDER — MORPHINE SULFATE (PF) 2 MG/ML IV SOLN
2.0000 mg | INTRAVENOUS | Status: DC | PRN
  Administered 2019-05-03 – 2019-05-04 (×4): 2 mg via INTRAVENOUS
  Filled 2019-05-03 (×4): qty 1

## 2019-05-03 MED ORDER — ORAL CARE MOUTH RINSE
15.0000 mL | Freq: Two times a day (BID) | OROMUCOSAL | Status: DC
  Administered 2019-05-04 – 2019-05-06 (×6): 15 mL via OROMUCOSAL

## 2019-05-03 MED ORDER — SODIUM CHLORIDE 0.9 % IV SOLN
1.0000 g | Freq: Two times a day (BID) | INTRAVENOUS | Status: DC
  Administered 2019-05-03 – 2019-05-06 (×7): 1 g via INTRAVENOUS
  Filled 2019-05-03 (×10): qty 1

## 2019-05-03 MED ORDER — AZITHROMYCIN 500 MG PO TABS
500.0000 mg | ORAL_TABLET | Freq: Every day | ORAL | Status: AC
  Administered 2019-05-03: 500 mg via ORAL
  Filled 2019-05-03: qty 1

## 2019-05-03 MED ORDER — FUROSEMIDE 10 MG/ML IJ SOLN
40.0000 mg | Freq: Two times a day (BID) | INTRAMUSCULAR | Status: DC
  Administered 2019-05-03 – 2019-05-05 (×5): 40 mg via INTRAVENOUS
  Filled 2019-05-03 (×5): qty 4

## 2019-05-03 MED ORDER — OXYCODONE HCL 5 MG PO TABS
5.0000 mg | ORAL_TABLET | Freq: Four times a day (QID) | ORAL | Status: DC | PRN
  Administered 2019-05-04: 5 mg via ORAL
  Filled 2019-05-03: qty 1

## 2019-05-03 MED ORDER — CHLORHEXIDINE GLUCONATE 0.12 % MT SOLN
15.0000 mL | Freq: Two times a day (BID) | OROMUCOSAL | Status: DC
  Administered 2019-05-03 – 2019-05-10 (×11): 15 mL via OROMUCOSAL
  Filled 2019-05-03 (×13): qty 15

## 2019-05-03 MED ORDER — CHLORHEXIDINE GLUCONATE CLOTH 2 % EX PADS
6.0000 | MEDICATED_PAD | Freq: Every day | CUTANEOUS | Status: DC
  Administered 2019-05-03 – 2019-05-10 (×7): 6 via TOPICAL

## 2019-05-03 NOTE — ED Notes (Signed)
Pt is continuously asking where her sons are and why they left her here. States son did not wake her when he left. Pt reoriented to time of day and care while in ED. PT states we gave her medicine to make her sleep. Informed pt that it has been night time and it is normal for the body to sleep at night. Pt is unhappy with this nurse at this time. Will continue to monitor.

## 2019-05-03 NOTE — Consult Note (Signed)
[name] MRN: [id number] DOB: 04-07-1924    ADMISSION DATE:  05/02/2019 CONSULTATION DATE: 05/03/2019  REFERRING MD : Dr. Leslye Peer   CHIEF COMPLAINT: Shortness of Breath   BRIEF PATIENT DESCRIPTION:  86 yo female admitted with acute on chronic hypoxic respiratory failure secondary to large left sided pleural effusion requiring left sided chest tube and acute on chronic diastolic CHF   SIGNIFICANT EVENTS/STUDIES:  03/19: Pt admitted to the stepdown unit with left sided chest tube   HISTORY OF PRESENT ILLNESS:   This is a 86 yo female with a PMH of Hypothyroidism, Seborrheic Dermatitis, Pericarditis, Osteoporosis, HTN, Hyperlipidemia, Rosacea, Poliomyelitis, Right Breat Cancer s/p radiation, Depression, Gout, Cortical Cataract, Anxiety, and Arthritis.  She presented to [facility] ER via EMS on 03/18 from home after her son noticed she was having worsening shortness of breath, onset of symptoms 1 week prior to presentation.  At baseline she ambulates with a walker.  EMS reported when they arrived at pts home she was hypoxic with O2 sats 86% on RA requiring 15L NRB.  Upon arrival to the ER pt noted to have labored respirations with O2 sats 96% on NRB.  CXR revealed a large left pleural effusion with compressive atelectasis requiring left sided chest tube placement with return of 1.2L of serosanguinous pleural fluid.  She was subsequently admitted to the stepdown unit per hospitalist team for additional workup and treatment.  PCCM team and Cardiothoracic Surgery consulted to assist with management.    PAST MEDICAL HISTORY :   has a past medical history of Anxiety, Arthritis, Cancer ([place]) (2011), Cataract cortical, senile, Depression, Gout, History of breast cancer, History of poliomyelitis, History of rosacea, Hyperlipidemia, unspecified, Hypertension, Hypothyroidism, Osteoporosis, post-menopausal, Pericarditis, Personal history of radiation therapy, Renal insufficiency, Seborrheic dermatitis, and  Thyroid disease.  has a past surgical history that includes Back surgery; Breast biopsy (Right, 2011); Cataract extraction (Left, 2008); Cardiac catheterization (03/15/2005); Fracture surgery (Left, 05/17/2005); Fracture surgery (Left, 05/2005); Fracture surgery (04/2004); Excision basal cell carcinoma (Left); and Mastectomy partial / lumpectomy (Right, 2011). Prior to Admission medications   Medication Sig Start Date End Date Taking? Authorizing Provider  acetaminophen (TYLENOL) 325 MG tablet Take 2 tablets (650 mg total) by mouth every 6 (six) hours as needed for mild pain, moderate pain or headache (headache). 09/11/15  Yes Gladstone Lighter, MD  amLODipine (NORVASC) 10 MG tablet Take 1 tablet (10 mg total) by mouth daily. 09/18/14  Yes Demetrios Loll, MD  atorvastatin (LIPITOR) 20 MG tablet Take 20 mg by mouth daily.   Yes [provider]  buPROPion (WELLBUTRIN XL) 150 MG 24 hr tablet Take 150 mg by mouth daily.   Yes [provider]  cholecalciferol (VITAMIN D) 1000 units tablet Take 2,000 Units by mouth daily.   Yes [provider]  escitalopram (LEXAPRO) 20 MG tablet Take 20 mg by mouth daily.    Yes [provider]  hydrALAZINE (APRESOLINE) 25 MG tablet Take 1 tablet (25 mg total) by mouth 3 (three) times daily. 09/18/14  Yes Demetrios Loll, MD  levothyroxine (SYNTHROID) 125 MCG tablet Take 125 mcg by mouth daily before breakfast.    Yes [provider]  metoprolol (LOPRESSOR) 50 MG tablet Take 1 tablet (50 mg total) by mouth 2 (two) times daily. 09/18/14  Yes Demetrios Loll, MD  polyethylene glycol [facility] / Floria Raveling) packet Take 17 g by mouth daily as needed for moderate constipation or severe constipation. 02/10/17  Yes Loletha Grayer, MD  potassium chloride (MICRO-K)  10 MEQ CR capsule Take 10 mEq by mouth daily.   Yes [provider]   Allergies  Allergen Reactions  . Codeine Nausea And Vomiting  . Tetracyclines & Related Nausea And Vomiting  .  Adhesive [Tape] Rash  . Keflex [Cephalexin] Rash  . Naproxen Rash    Patient was on both Keflex and Naproxen when developed rash -  unclear as to which caused rash    FAMILY HISTORY:  family history includes Alzheimer's disease in her brother and mother; Coronary artery disease in her mother; Hypertension in her mother and another family member; Lung cancer in her father. SOCIAL HISTORY:  reports that she has never smoked. She has never used smokeless tobacco. She reports that she does not drink alcohol or use drugs.  REVIEW OF SYSTEMS:  Positives in BOLD  Gen: Denies fever, chills, weight change, fatigue, night sweats HEENT: Denies blurred vision, double vision, hearing loss, tinnitus, sinus congestion, rhinorrhea, sore throat, neck stiffness, dysphagia PULM: shortness of breath, cough, sputum production, hemoptysis, wheezing CV: left sided chest pain at chest tube site, edema, orthopnea, paroxysmal nocturnal dyspnea, palpitations GI: Denies abdominal pain, nausea, vomiting, diarrhea, hematochezia, melena, constipation, change in bowel habits GU: Denies dysuria, hematuria, polyuria, oliguria, urethral discharge Endocrine: Denies hot or cold intolerance, polyuria, polyphagia or appetite change Derm: Denies rash, dry skin, scaling or peeling skin change Heme: Denies easy bruising, bleeding, bleeding gums Neuro: Denies headache, numbness, weakness, slurred speech, loss of memory or consciousness  SUBJECTIVE:  No complaints at this time   VITAL SIGNS: Temp:  [97.9 F (36.6 C)] 97.9 F (36.6 C) (03/19 1831) Pulse Rate:  [61-74] 69 (03/19 1831) Resp:  [13-23] 18 (03/19 1831) BP: (97-152)/(45-98) 141/62 (03/19 1831) SpO2:  [90 %-99 %] 91 % (03/19 1831)  PHYSICAL EXAMINATION: General: acutely ill appearing elderly female, resting in bed NAD  Neuro: alert and disoriented to situation at times, follows commands, PERRLA HEENT: supple, no JVD Cardiovascular: nsr, rrr, no R/G, left sided  chest tube with serosanguinous drainage   Lungs: crackles throughout, diminished on the left, even, non labored  Abdomen: +BS x4, obese, soft, non tender, non distended  Musculoskeletal: 1+ bilateral lower extremity edema, moves all extremities  Skin: left sided chest tube incision site dressing clean/dry/intact   Recent Labs  Lab 05/02/19 1634 05/02/19 2246 05/03/19 0538  NA 140  --  142  K 3.7  --  3.7  CL 103  --  105  CO2 29  --  29  BUN 15  --  15  CREATININE 1.07* 1.05* 0.97  GLUCOSE 120*  --  91   Recent Labs  Lab 05/02/19 1634 05/02/19 2246 05/03/19 0538  HGB 13.1 12.0 12.2  HCT 43.3 40.1 40.6  WBC 8.7 9.8 12.3*  PLT 267 247 241   DG Chest Portable 1 View  Result Date: 05/02/2019 CLINICAL DATA:  Post chest tube placement EXAM: PORTABLE CHEST 1 VIEW COMPARISON:  05/02/2019, 02/06/2017 FINDINGS: Interim placement of left lower chest tube with the tip projecting over the central left lower lung. Significant decrease in left pleural effusion with small residual. Small right-sided pleural effusion. Enlarged cardiomediastinal silhouette with vascular congestion. Hazy and interstitial opacities likely due to edema. Residual partial atelectasis at the left base. No pneumothorax. Aortic atherosclerosis. IMPRESSION: 1. Placement of left-sided chest tube with decreased left pleural effusion and improved aeration of left thorax. Small residual left pleural effusion and partial atelectasis at the left base. 2. Small right pleural effusion with platelike  atelectasis at the right base 3. Cardiomegaly with vascular congestion and pulmonary edema Electronically Signed   By: Donavan Foil M.D.   On: 05/02/2019 21:19   DG Chest Portable 1 View  Result Date: 05/02/2019 CLINICAL DATA:  Respiratory distress. EXAM: PORTABLE CHEST 1 VIEW COMPARISON:  02/06/2017 FINDINGS: Lungs are hypoinflated with near complete opacification of the left lung with minimal aeration over the left upper lobe. Patchy  hazy opacification of the right lung. Minimal blunting of the right costophrenic angle. Left heart border is obscured. Remainder of the exam is unchanged. IMPRESSION: Findings likely due to large left effusion with associated atelectasis. Underlying mass or infection is possible. Patchy hazy opacification over the right lung which may be due to edema or infection. Possible small amount right pleural fluid. Electronically Signed   By: Marin Olp M.D.   On: 05/02/2019 16:37   ECHOCARDIOGRAM COMPLETE  Result Date: 05/03/2019    ECHOCARDIOGRAM REPORT   Patient Name:   Andrea Schaefer Date of Exam: 05/03/2019 Medical Rec #:  [id number]   Height:       65.0 in Accession #:    [id number]  Weight:       200.0 lb Date of Birth:  August 02, 1924   BSA:          1.978 m Patient Age:    52 years    BP:           120/59 mmHg Patient Gender: F           HR:           67 bpm. Exam Location:  [facility] Procedure: 2D Echo, Cardiac Doppler and Color Doppler Indications:     CHF-acute diastolic [id number]  History:         Patient has no prior history of Echocardiogram examinations.                  Risk Factors:Hypertension.  Sonographer:     Sherrie Sport RDCS (AE) Referring Phys:  [id number] Athena Masse Diagnosing Phys: Nelva Bush MD  Sonographer Comments: Suboptimal apical window. IMPRESSIONS  1. Left ventricular ejection fraction, by estimation, is 60 to 65%. The left ventricle has normal function. The left ventricle has no regional wall motion abnormalities. There is moderate left ventricular hypertrophy. Left ventricular diastolic parameters are consistent with Grade I diastolic dysfunction (impaired relaxation). Elevated left atrial pressure.  2. There is moderate pulmonary hypertension (PASP 40-45 mmHg plus central venous pressure). Right ventricular systolic function is low normal. The right ventricular size is moderately enlarged. mildly increased right ventricular wall thickness.  3. Right atrial size was mildly dilated.  4. Question  small posterior pericardial effusion, though internal echos raise possibility of complex fluid or soft tissue in the pericardial space.. The pericardial effusion is posterior to the left ventricle.  5. The mitral valve is rheumatic. Trivial mitral valve regurgitation. No evidence of mitral stenosis.  6. Tricuspid valve regurgitation is mild to moderate.  7. The aortic valve is tricuspid. Aortic valve regurgitation is not visualized. Mild aortic valve sclerosis is present, with no evidence of aortic valve stenosis. FINDINGS  Left Ventricle: Left ventricular ejection fraction, by estimation, is 60 to 65%. The left ventricle has normal function. The left ventricle has no regional wall motion abnormalities. The left ventricular internal cavity size was normal in size. There is  moderate left ventricular hypertrophy. Left ventricular diastolic parameters are consistent with Grade I diastolic dysfunction (impaired relaxation). Elevated left atrial pressure. Right  Ventricle: There is moderate pulmonary hypertension (PASP 40-45 mmHg plus central venous pressure). The right ventricular size is moderately enlarged. Mildly increased right ventricular wall thickness. Right ventricular systolic function is low normal. Left Atrium: Left atrial size was normal in size. Right Atrium: Right atrial size was mildly dilated. Pericardium: Question small posterior pericardial effusion, though internal echos raise possibility of complex fluid or soft tissue in the pericardial space. The pericardium was not well visualized. The pericardial effusion is posterior to the left ventricle. Mitral Valve: The mitral valve is rheumatic. Trivial mitral valve regurgitation. No evidence of mitral valve stenosis. Tricuspid Valve: The tricuspid valve is normal in structure. Tricuspid valve regurgitation is mild to moderate. Aortic Valve: The aortic valve is tricuspid. . There is mild thickening and mild calcification of the aortic valve. Aortic valve  regurgitation is not visualized. Mild aortic valve sclerosis is present, with no evidence of aortic valve stenosis. There is mild thickening of the aortic valve. There is mild calcification of the aortic valve. Aortic valve mean gradient measures 5.3 mmHg. Aortic valve peak gradient measures 9.3 mmHg. Aortic valve area, by VTI measures 1.78 cm. Pulmonic Valve: The pulmonic valve was not well visualized. Pulmonic valve regurgitation is mild. No evidence of pulmonic stenosis. Aorta: The aortic root is normal in size and structure. Pulmonary Artery: The pulmonary artery is not well seen. Venous: The inferior vena cava was not well visualized. IAS/Shunts: The interatrial septum was not well visualized. Additional Comments: There is pleural effusion in the left lateral region.  LEFT VENTRICLE PLAX 2D LVIDd:         3.72 cm  Diastology LVIDs:         2.06 cm  LV e' lateral:   3.92 cm/s LV PW:         1.23 cm  LV E/e' lateral: 19.1 LV IVS:        1.11 cm  LV e' medial:    3.92 cm/s LVOT diam:     2.00 cm  LV E/e' medial:  19.1 LV SV:         55 LV SV Index:   28 LVOT Area:     3.14 cm  RIGHT VENTRICLE RV Basal diam:  5.01 cm RV S prime:     10.60 cm/s TAPSE (M-mode): 2.1 cm LEFT ATRIUM             Index       RIGHT ATRIUM           Index LA diam:        2.60 cm 1.31 cm/m  RA Area:     21.50 cm LA Vol (A2C):   80.1 ml 40.49 ml/m RA Volume:   61.40 ml  31.04 ml/m LA Vol (A4C):   27.9 ml 14.10 ml/m LA Biplane Vol: 48.7 ml 24.62 ml/m  AORTIC VALVE                    PULMONIC VALVE AV Area (Vmax):    1.54 cm     PV Vmax:        0.55 m/s AV Area (Vmean):   1.36 cm     PV Peak grad:   1.2 mmHg AV Area (VTI):     1.78 cm     RVOT Peak grad: 2 mmHg AV Vmax:           152.67 cm/s AV Vmean:          106.433 cm/s AV VTI:  0.311 m AV Peak Grad:      9.3 mmHg AV Mean Grad:      5.3 mmHg LVOT Vmax:         74.60 cm/s LVOT Vmean:        46.100 cm/s LVOT VTI:          0.176 m LVOT/AV VTI ratio: 0.57  AORTA Ao Root  diam: 2.90 cm MITRAL VALVE               TRICUSPID VALVE MV Area (PHT): 2.18 cm    TR Peak grad:   44.4 mmHg MV Decel Time: 348 msec    TR Vmax:        333.00 cm/s MV E velocity: 74.70 cm/s MV A velocity: 85.40 cm/s  SHUNTS MV E/A ratio:  0.87        Systemic VTI:  0.18 m                            Systemic Diam: 2.00 cm Nelva Bush MD Electronically signed by Nelva Bush MD Signature Date/Time: 05/03/2019/3:57:27 PM    Final     ASSESSMENT / PLAN: Acute on chronic hypoxic respiratory failure secondary to left sided pleural effusion and acute on chronic diastolic CHF  Supplemental O2 for dyspnea and/or hypoxia  Prn bronchodilator therapy  Per cardiothoracic surgery recommendations-chest tube to remain in place with suction at 20 cm of water for now Continue IV lasix Trend WBC and monitor fever curve Continue meropenem  Follow cultures  Aggressive pulmonary hygiene  Trend BMP  Replace electrolytes as indicated  Monitor UOP Avoid nephrotoxic medications   Best Practice: VTE px: subq lovenox SUP px: not indicated  Diet: Heart Healthy   Marda Stalker, [place] Pager [phone] (please enter 7 digits) PCCM Consult Pager [phone] (please enter 7 digits)

## 2019-05-03 NOTE — ED Notes (Signed)
Pt resting in bed, calls out and asks for water to drink. Pt provided with ice cold water. PT again asks when she will be going home, patient educated on need to be able to take care of self and breath well so that she will not have to come back. Pt unhappy family is not here at this time. Call light in reach, will continue to monitor.

## 2019-05-03 NOTE — ED Notes (Signed)
Pt awoke from sleep and hit call light complaining of pain in the left side at chest tube. MD notified and pain medication orders placed and admin to pt

## 2019-05-03 NOTE — ED Notes (Signed)
Pt has bruising and edema noted on the right forearm. Son states pt fell several weeks ago. MD notified. Will continue to monitor.

## 2019-05-03 NOTE — ED Notes (Signed)
Pt requests water, provided per request. Asks again where her family is and states that she wants to see them. Pt was educated on situation. Call light in reach

## 2019-05-03 NOTE — Progress Notes (Signed)
Assumed care for patient at 1905. Patient wake, alert, and oriented to person only. Patient did say the year was 2020. Patient reoriented to time, place and her current medical situation. Patient is on HFNC at 12L. Lung sound are clear and slightly diminished in the bilateral bases. Left chest tube in place with suction at 20 and no air leaks noted.  Patient educated on using the incentive spirometer, however patient requires extensive education to perform the task correctly. HOB elevated at 45 degrees. IV/ABT for pneumonia administered with no adverse reactions noted. Vital signs are within normal limits. Patient ate 75% of sandwich dinner. Call bell within reach. Will continue to monitor.

## 2019-05-03 NOTE — ED Notes (Signed)
Warm blanket provided.

## 2019-05-03 NOTE — Progress Notes (Signed)
*  PRELIMINARY RESULTS* Echocardiogram 2D Echocardiogram has been performed.  Sherrie Sport 05/03/2019, 1:33 PM

## 2019-05-03 NOTE — Consult Note (Addendum)
Pharmacy Antibiotic Note  Andrea Schaefer is a 86 y.o. female admitted on 05/02/2019 with pneumonia.  Pharmacy has been consulted for Meropenem dosing.  Patient has an order for azithromycin.   PCN allergy - low  And documented in 2016. Patient was taking keflex and naproxen at the same time. Per son, he does not remember a severe reaction (SOB, facial swelling, etc.)  and denies a severe rash.   Plan: 1. Meropenem 1g Q12H  Height: 5\' 5"  (165.1 cm) Weight: 200 lb (90.7 kg) IBW/kg (Calculated) : 57  Temp (24hrs), Avg:97.4 F (36.3 C), Min:97.4 F (36.3 C), Max:97.4 F (36.3 C)  Recent Labs  Lab 05/02/19 1626 05/02/19 1634 05/02/19 2246 05/03/19 0538  WBC  --  8.7 9.8 12.3*  CREATININE  --  1.07* 1.05* 0.97  LATICACIDVEN 0.7  --   --   --     Estimated Creatinine Clearance: 41.2 mL/min (by C-G formula based on SCr of 0.97 mg/dL).    Allergies  Allergen Reactions  . Codeine Nausea And Vomiting  . Tetracyclines & Related Nausea And Vomiting  . Adhesive [Tape] Rash  . Keflex [Cephalexin] Rash  . Naproxen Rash    Patient was on both Keflex and Naproxen when developed rash -  unclear as to which caused rash    Antimicrobials this admission:  Dose adjustments this admission:  Microbiology results:   Thank you for allowing pharmacy to be a part of this patient's care.  Rowland Lathe 05/03/2019 8:15 AM

## 2019-05-03 NOTE — ED Notes (Signed)
Pt anxious and yelling out from bed. PT reports having too many blankets on her person and being uncomfortable. Attempted to remove NRB but was redirected by this nurse and nurse Annie Main. PT assisted with repositioning and comfort measures and pillow provided to pt liking and left in bed with call light in reach

## 2019-05-03 NOTE — ED Notes (Signed)
Pt unable to tolerate [place], sats declined to 87% on 6LPM. Replaced on NRB at 15LPM

## 2019-05-03 NOTE — Progress Notes (Signed)
Pt continues to remove NRB mask, pt titrated to 10L Cool Mist HFNC. Pt assisted with drink of water and repositioned for comfort.

## 2019-05-03 NOTE — Progress Notes (Signed)
Patient ID: Andrea Schaefer, female   DOB: 02/07/1925, 86 y.o.   MRN: [id number]  Chief Complaint  Patient presents with  . Shortness of Breath    Referred By Dr. Marquis Lunch Reason for Referral management of left chest tube  HPI Location, Quality, Duration, Severity, Timing, Context, Modifying Factors, Associated Signs and Symptoms.  Andrea Schaefer is a 86 y.o. female.  She is currently sitting in the emergency department where she is inspiring nasal cannula oxygen.  She appears very comfortable.  I attempted to discuss with her her medical condition but she appeared somewhat confused and I am entirely unclear as to exactly her history.  However her son states that she has been short of breath for about a week.  When she came to the emergency department she had a very large left-sided pleural effusion and a chest tube was inserted.  The chest tube is well-positioned and drained about 1200 cc from the chest.  There is no air leak present.  There is some serosanguineous fluid in the tube.  Overall she states that she feels better at this time.   Past Medical History:  Diagnosis Date  . Anxiety    unspecified  . Arthritis   . Cancer [facility]) 2011   Right  . Cataract cortical, senile   . Depression   . Gout   . History of breast cancer   . History of poliomyelitis    as a child  . History of rosacea   . Hyperlipidemia, unspecified   . Hypertension   . Hypothyroidism    unspecified  . Osteoporosis, post-menopausal   . Pericarditis   . Personal history of radiation therapy   . Renal insufficiency   . Seborrheic dermatitis   . Thyroid disease     Past Surgical History:  Procedure Laterality Date  . BACK SURGERY    . BASAL CELL CARCINOMA EXCISION Left    BCC left cheek  . BREAST BIOPSY Right 2011   +  . CARDIAC CATHETERIZATION  03/15/2005  . CATARACT EXTRACTION Left 2008  . FRACTURE SURGERY Left 05/17/2005   ORIF left distal radius fracture  . FRACTURE SURGERY Left 05/2005   left wrist  . FRACTURE SURGERY  04/2004   right wrist  . MASTECTOMY PARTIAL / LUMPECTOMY Right 2011   right breast    Family History  Problem Relation Age of Onset  . Hypertension Other   . Alzheimer's disease Mother   . Coronary artery disease Mother   . Hypertension Mother   . Lung cancer Father   . Alzheimer's disease Brother   . Breast cancer Neg Hx     Social History Social History   Tobacco Use  . Smoking status: Never Smoker  . Smokeless tobacco: Never Used  Substance Use Topics  . Alcohol use: No  . Drug use: No    Allergies  Allergen Reactions  . Codeine Nausea And Vomiting  . Tetracyclines & Related Nausea And Vomiting  . Adhesive [Tape] Rash  . Keflex [Cephalexin] Rash  . Naproxen Rash    Patient was on both Keflex and Naproxen when developed rash -  unclear as to which caused rash    Current Facility-Administered Medications  Medication Dose Route Frequency Provider Last Rate Last Admin  . atorvastatin (LIPITOR) tablet 20 mg  20 mg Oral Daily Athena Masse, MD   20 mg at 05/03/19 [id number]  . [START ON 05/04/2019] azithromycin (ZITHROMAX) tablet 250 mg  250 mg Oral Daily  Loletha Grayer, MD      . buPROPion (WELLBUTRIN XL) 24 hr tablet 150 mg  150 mg Oral Daily Athena Masse, MD   150 mg at 05/03/19 0923  . enoxaparin (LOVENOX) injection 40 mg  40 mg Subcutaneous Q24H Athena Masse, MD   40 mg at 05/02/19 2244  . escitalopram (LEXAPRO) tablet 20 mg  20 mg Oral Daily Judd Gaudier V, MD   20 mg at 05/03/19 [id number]  . furosemide (LASIX) injection 40 mg  40 mg Intravenous BID Loletha Grayer, MD   40 mg at 05/03/19 0903  . levothyroxine (SYNTHROID) tablet 125 mcg  125 mcg Oral QAC breakfast Athena Masse, MD   125 mcg at 05/03/19 0533  . meropenem (MERREM) 1 g in sodium chloride 0.9 % 100 mL IVPB  1 g Intravenous Q12H Rowland Lathe, RPH   Stopped at 05/03/19 0947  . metoprolol tartrate (LOPRESSOR) tablet 50 mg  50 mg Oral BID Athena Masse, MD   50 mg  at 05/03/19 0921  . morphine 2 MG/ML injection 2 mg  2 mg Intravenous Q4H PRN Lang Snow, NP   2 mg at 05/03/19 0917  . oxyCODONE (Oxy IR/ROXICODONE) immediate release tablet 5 mg  5 mg Oral Q6H PRN Lang Snow, NP       Current Outpatient Medications  Medication Sig Dispense Refill  . acetaminophen (TYLENOL) 325 MG tablet Take 2 tablets (650 mg total) by mouth every 6 (six) hours as needed for mild pain, moderate pain or headache (headache). 30 tablet 0  . amLODipine (NORVASC) 10 MG tablet Take 1 tablet (10 mg total) by mouth daily. 30 tablet 0  . atorvastatin (LIPITOR) 20 MG tablet Take 20 mg by mouth daily.    Marland Kitchen buPROPion (WELLBUTRIN XL) 150 MG 24 hr tablet Take 150 mg by mouth daily.    . cholecalciferol (VITAMIN D) 1000 units tablet Take 2,000 Units by mouth daily.    Marland Kitchen escitalopram (LEXAPRO) 20 MG tablet Take 20 mg by mouth daily.     . hydrALAZINE (APRESOLINE) 25 MG tablet Take 1 tablet (25 mg total) by mouth 3 (three) times daily. 90 tablet 0  . levothyroxine (SYNTHROID) 125 MCG tablet Take 125 mcg by mouth daily before breakfast.     . metoprolol (LOPRESSOR) 50 MG tablet Take 1 tablet (50 mg total) by mouth 2 (two) times daily. 30 tablet 0  . polyethylene glycol (MIRALAX / GLYCOLAX) packet Take 17 g by mouth daily as needed for moderate constipation or severe constipation. 14 each 0  . potassium chloride (MICRO-K) 10 MEQ CR capsule Take 10 mEq by mouth daily.        Review of Systems A complete review of systems was asked and was negative except for the following positive findings unable to obtain  Blood pressure (!) 120/59, pulse 67, temperature (!) 97.4 F (36.3 C), temperature source Oral, resp. rate 16, height 5\' 5"  (1.651 m), weight 90.7 kg, SpO2 92 %.  Physical Exam CONSTITUTIONAL:  Pleasant, well-developed, well-nourished, and in no acute distress. EYES: Pupils equal and reactive to light, Sclera non-icteric EARS, NOSE, MOUTH AND THROAT:  The  oropharynx was clear.  Dentition is good repair.  Oral mucosa pink and moist. LYMPH NODES:  Lymph nodes in the neck and axillae were normal RESPIRATORY:  Lungs were clear.  Normal respiratory effort without pathologic use of accessory muscles of respiration CARDIOVASCULAR: Heart was regular without murmurs.  There were no carotid bruits.  GI: The abdomen was soft, nontender, and nondistended. There were no palpable masses. There was no hepatosplenomegaly. There were normal bowel sounds in all quadrants. GU:  Rectal deferred.   MUSCULOSKELETAL:  Normal muscle strength and tone.  No clubbing or cyanosis.   SKIN:  There were no pathologic skin lesions.  There were no nodules on palpation. NEUROLOGIC:  Sensation is normal.  Cranial nerves are grossly intact. PSYCH:  Oriented to person, place and time.  Mood and affect are normal.  Data Reviewed Chest x-rays pre and post chest tube insertion  I have personally reviewed the patient's imaging, laboratory findings and medical records.    Assessment    Left pleural effusion status post chest tube insertion with near complete drainage    Plan    At the present time I would recommend we leave her chest tube to suction at 20 cm of water.  She will be admitted to the hospital for management of her heart failure.  We will continue her chest tube to suction over the weekend and make further assessments next week.    Nestor Lewandowsky, MD 05/03/2019, 2:07 PM   Patient ID: Andrea Schaefer, female   DOB: 1924-06-19, 86 y.o.   MRN: [id number]

## 2019-05-03 NOTE — Progress Notes (Signed)
Patient ID: Andrea Schaefer, female   DOB: 06/28/1924, 86 y.o.   MRN: [id number] [facility] PROGRESS NOTE  Andrea Schaefer [id number] DOB: 06-17-1924 DOA: 05/02/2019 PCP: [name], MD  HPI/Subjective: Patient stated she was dreaming.  She does have a little discomfort in her left chest where the chest tube is placed.  States she is breathing okay.  Objective: Vitals:   05/03/19 1130 05/03/19 1200  BP: 127/66 (!) 120/59  Pulse: 65 67  Resp: 18 16  Temp:    SpO2: 95% 92%    Intake/Output Summary (Last 24 hours) at 05/03/2019 1313 Last data filed at 05/03/2019 0947 Gross per 24 hour  Intake 300 ml  Output --  Net 300 ml   Filed Weights   05/02/19 1625  Weight: 90.7 kg    ROS: Review of Systems  Constitutional: Negative for fever.  Eyes: Negative for blurred vision.  Respiratory: Positive for shortness of breath. Negative for cough.   Cardiovascular: Positive for chest pain.  Gastrointestinal: Negative for abdominal pain, nausea and vomiting.  Genitourinary: Negative for dysuria.  Musculoskeletal: Negative for joint pain.  Neurological: Negative for headaches.   Exam: Physical Exam  HENT:  Nose: No mucosal edema.  Mouth/Throat: No oropharyngeal exudate or posterior oropharyngeal edema.  Eyes: Conjunctivae and lids are normal.  Cardiovascular: S1 normal and S2 normal. Exam reveals no gallop.  No murmur heard. Respiratory: No respiratory distress. She has decreased breath sounds in the right lower field, the left middle field and the left lower field. She has no wheezes. She has rhonchi in the left lower field. She has rales in the right lower field.  GI: Soft. Bowel sounds are normal. There is no abdominal tenderness.  Musculoskeletal:     Right ankle: Swelling present.     Left ankle: Swelling present.  Lymphadenopathy:    She has no cervical adenopathy.  Neurological: She is alert. No cranial nerve deficit.  Skin: Skin is warm. Nails show no clubbing.   Bruising right arm  Psychiatric: She has a normal mood and affect.      Data Reviewed: Basic Metabolic Panel: Recent Labs  Lab 05/02/19 1634 05/02/19 2246 05/03/19 0538  NA 140  --  142  K 3.7  --  3.7  CL 103  --  105  CO2 29  --  29  GLUCOSE 120*  --  91  BUN 15  --  15  CREATININE 1.07* 1.05* 0.97  CALCIUM 9.2  --  8.8*   Liver Function Tests: Recent Labs  Lab 05/02/19 1634  AST 16  ALT 12  ALKPHOS 79  BILITOT 0.9  PROT 7.5  ALBUMIN 3.7   CBC: Recent Labs  Lab 05/02/19 1634 05/02/19 2246 05/03/19 0538  WBC 8.7 9.8 12.3*  NEUTROABS 6.8  --   --   HGB 13.1 12.0 12.2  HCT 43.3 40.1 40.6  MCV 95.6 95.7 96.0  PLT 267 247 241   BNP (last 3 results) Recent Labs    05/02/19 1634  BNP 469.0*     Recent Results (from the past 240 hour(s))  Blood culture (single)     Status: None (Preliminary result)   Collection Time: 05/02/19  4:35 PM   Specimen: BLOOD  Result Value Ref Range Status   Specimen Description BLOOD RIGHT ANTECUBITAL  Final   Special Requests   Final    BOTTLES DRAWN AEROBIC AND ANAEROBIC Blood Culture adequate volume   Culture   Final    NO  GROWTH < 24 HOURS Performed at [facility], [address] [place], [place] [address]    Report Status PENDING  Incomplete  Blood culture (single)     Status: None (Preliminary result)   Collection Time: 05/02/19  4:54 PM   Specimen: BLOOD  Result Value Ref Range Status   Specimen Description BLOOD BLOOD LEFT HAND  Final   Special Requests   Final    BOTTLES DRAWN AEROBIC AND ANAEROBIC Blood Culture results may not be optimal due to an inadequate volume of blood received in culture bottles   Culture   Final    NO GROWTH < 24 HOURS Performed at [facility], [address], [place], [place] [address]    Report Status PENDING  Incomplete  Respiratory Panel by RT PCR (Flu A&B, Covid) - Nasopharyngeal Swab     Status: None   Collection Time: 05/02/19  5:05 PM   Specimen:  Nasopharyngeal Swab  Result Value Ref Range Status   SARS Coronavirus 2 by RT PCR NEGATIVE NEGATIVE Final    Comment: (NOTE) SARS-CoV-2 target nucleic acids are NOT DETECTED. The SARS-CoV-2 RNA is generally detectable in upper respiratoy specimens during the acute phase of infection. The lowest concentration of SARS-CoV-2 viral copies this assay can detect is 131 copies/mL. A negative result does not preclude SARS-Cov-2 infection and should not be used as the sole basis for treatment or other patient management decisions. A negative result may occur with  improper specimen collection/handling, submission of specimen other than nasopharyngeal swab, presence of viral mutation(s) within the areas targeted by this assay, and inadequate number of viral copies (<131 copies/mL). A negative result must be combined with clinical observations, patient history, and epidemiological information. The expected result is Negative. Fact Sheet for Patients:  PinkCheek.be Fact Sheet for Healthcare Providers:  GravelBags.it This test is not yet ap proved or cleared by the [place] FDA and  has been authorized for detection and/or diagnosis of SARS-CoV-2 by FDA under an Emergency Use Authorization (EUA). This EUA will remain  in effect (meaning this test can be used) for the duration of the COVID-19 declaration under Section 564(b)(1) of the Act, 21 U.S.C. section 360bbb-3(b)(1), unless the authorization is terminated or revoked sooner.    Influenza A by PCR NEGATIVE NEGATIVE Final   Influenza B by PCR NEGATIVE NEGATIVE Final    Comment: (NOTE) The Xpert Xpress SARS-CoV-2/FLU/RSV assay is intended as an aid in  the diagnosis of influenza from Nasopharyngeal swab specimens and  should not be used as a sole basis for treatment. Nasal washings and  aspirates are unacceptable for Xpert Xpress SARS-CoV-2/FLU/RSV  testing. Fact Sheet for  Patients: PinkCheek.be Fact Sheet for Healthcare Providers: GravelBags.it This test is not yet approved or cleared by the [place] FDA and  has been authorized for detection and/or diagnosis of SARS-CoV-2 by  FDA under an Emergency Use Authorization (EUA). This EUA will remain  in effect (meaning this test can be used) for the duration of the  Covid-19 declaration under Section 564(b)(1) of the Act, 21  U.S.C. section 360bbb-3(b)(1), unless the authorization is  terminated or revoked. Performed at [facility], [address] [place], [place] [address]   Body fluid culture (includes gram stain)     Status: None (Preliminary result)   Collection Time: 05/02/19  8:31 PM   Specimen: Pleural Fluid  Result Value Ref Range Status   Specimen Description   Final    PLEURAL Performed at [facility]  [facility], [address] [place], [place] [address]    Special Requests   Final    NONE Performed at [facility], [address] [place], [place] [address]    Gram Stain   Final    FEW WBC PRESENT, PREDOMINANTLY MONONUCLEAR NO ORGANISMS SEEN    Culture   Final    NO GROWTH < 12 HOURS Performed at [facility] [address] [place], [place] [address]    Report Status PENDING  Incomplete     Studies: DG Chest Portable 1 View  Result Date: 05/02/2019 CLINICAL DATA:  Post chest tube placement EXAM: PORTABLE CHEST 1 VIEW COMPARISON:  05/02/2019, 02/06/2017 FINDINGS: Interim placement of left lower chest tube with the tip projecting over the central left lower lung. Significant decrease in left pleural effusion with small residual. Small right-sided pleural effusion. Enlarged cardiomediastinal silhouette with vascular congestion. Hazy and interstitial opacities likely due to edema. Residual partial atelectasis at the left base. No pneumothorax. Aortic atherosclerosis. IMPRESSION: 1. Placement of  left-sided chest tube with decreased left pleural effusion and improved aeration of left thorax. Small residual left pleural effusion and partial atelectasis at the left base. 2. Small right pleural effusion with platelike atelectasis at the right base 3. Cardiomegaly with vascular congestion and pulmonary edema Electronically Signed   By: Donavan Foil M.D.   On: 05/02/2019 21:19   DG Chest Portable 1 View  Result Date: 05/02/2019 CLINICAL DATA:  Respiratory distress. EXAM: PORTABLE CHEST 1 VIEW COMPARISON:  02/06/2017 FINDINGS: Lungs are hypoinflated with near complete opacification of the left lung with minimal aeration over the left upper lobe. Patchy hazy opacification of the right lung. Minimal blunting of the right costophrenic angle. Left heart border is obscured. Remainder of the exam is unchanged. IMPRESSION: Findings likely due to large left effusion with associated atelectasis. Underlying mass or infection is possible. Patchy hazy opacification over the right lung which may be due to edema or infection. Possible small amount right pleural fluid. Electronically Signed   By: Marin Olp M.D.   On: 05/02/2019 16:37    Scheduled Meds: . atorvastatin  20 mg Oral Daily  . [START ON 05/04/2019] azithromycin  250 mg Oral Daily  . buPROPion  150 mg Oral Daily  . enoxaparin (LOVENOX) injection  40 mg Subcutaneous Q24H  . escitalopram  20 mg Oral Daily  . furosemide  40 mg Intravenous BID  . levothyroxine  125 mcg Oral QAC breakfast  . metoprolol tartrate  50 mg Oral BID   Continuous Infusions: . meropenem (MERREM) IV Stopped (05/03/19 0947)    Assessment/Plan:  1. Acute on chronic hypoxic respiratory failure.  Patient currently on 12 L high flow nasal cannula.  Change status to stepdown.  Consulted critical care specialist. 2. Large left pleural effusion status post chest tube placement.  Case discussed with surgery and they recommend chest tube staying in through the weekend.  Empiric  antibiotics for now. 3. Acute CHF.  IV Lasix ordered.  Echocardiogram ordered to check EF.  Patient on metoprolol. 4. Acute cystitis.  Follow-up urine culture 5. Essential hypertension on metoprolol.  Holding amlodipine and hydralazine 6. Hypothyroidism unspecified on levothyroxine 7. Anxiety depression on Lexapro 8. Hyperlipidemia unspecified on atorvastatin  Code Status:     Code Status Orders  (From admission, onward)         Start     Ordered   05/02/19 2116  Full code  Continuous     05/02/19  2131        Code Status History    Date Active Date Inactive Code Status Order ID Comments User Context   02/16/2018 1921 02/18/2018 1826 Full Code [id number]  Hillary Bow, MD ED   02/06/2017 2332 02/11/2017 1448 Full Code [id number]  Max Sane, MD Inpatient   09/08/2015 2101 09/11/2015 1243 Full Code [id number]  Max Sane, MD Inpatient   09/12/2014 2025 09/18/2014 1609 Full Code [id number]  Hower, Aaron Mose, MD ED   Advance Care Planning Activity    Advance Directive Documentation     Most Recent Value  Type of Advance Directive  Healthcare Power of Attorney  Pre-existing out of facility DNR order (yellow form or pink MOST form)  --  "MOST" Form in Place?  --     Family Communication: Spoke with son on the phone Disposition Plan: Change in status to stepdown status.  Patient has a chest tube in and on high flow nasal cannula.  Patient will have to be down on regular nasal cannula and chest tube will have to be out for any disposition to happen.  Likely this is going to take 5 days or longer.  Consultants:  Critical care specialist  Cardiothoracic surgery  Procedures: -Chest tube placement  Antibiotics:  Meropenem  Zithromax  Time spent: 28 minutes  [place]

## 2019-05-04 ENCOUNTER — Inpatient Hospital Stay: Payer: Medicare Other

## 2019-05-04 DIAGNOSIS — R410 Disorientation, unspecified: Secondary | ICD-10-CM

## 2019-05-04 DIAGNOSIS — J181 Lobar pneumonia, unspecified organism: Secondary | ICD-10-CM

## 2019-05-04 DIAGNOSIS — I5031 Acute diastolic (congestive) heart failure: Secondary | ICD-10-CM

## 2019-05-04 LAB — CBC WITH DIFFERENTIAL/PLATELET
Abs Immature Granulocytes: 0.04 10*3/uL (ref 0.00–0.07)
Basophils Absolute: 0 10*3/uL (ref 0.0–0.1)
Basophils Relative: 0 %
Eosinophils Absolute: 0.2 10*3/uL (ref 0.0–0.5)
Eosinophils Relative: 2 %
HCT: 39.4 % (ref 36.0–46.0)
Hemoglobin: 12 g/dL (ref 12.0–15.0)
Immature Granulocytes: 1 %
Lymphocytes Relative: 16 %
Lymphs Abs: 1.4 10*3/uL (ref 0.7–4.0)
MCH: 28.5 pg (ref 26.0–34.0)
MCHC: 30.5 g/dL (ref 30.0–36.0)
MCV: 93.6 fL (ref 80.0–100.0)
Monocytes Absolute: 1.2 10*3/uL — ABNORMAL HIGH (ref 0.1–1.0)
Monocytes Relative: 13 %
Neutro Abs: 5.9 10*3/uL (ref 1.7–7.7)
Neutrophils Relative %: 68 %
Platelets: 237 10*3/uL (ref 150–400)
RBC: 4.21 MIL/uL (ref 3.87–5.11)
RDW: 16.7 % — ABNORMAL HIGH (ref 11.5–15.5)
WBC: 8.7 10*3/uL (ref 4.0–10.5)
nRBC: 0 % (ref 0.0–0.2)

## 2019-05-04 LAB — BASIC METABOLIC PANEL
Anion gap: 10 (ref 5–15)
BUN: 15 mg/dL (ref 8–23)
CO2: 34 mmol/L — ABNORMAL HIGH (ref 22–32)
Calcium: 8.5 mg/dL — ABNORMAL LOW (ref 8.9–10.3)
Chloride: 98 mmol/L (ref 98–111)
Creatinine, Ser: 0.97 mg/dL (ref 0.44–1.00)
GFR calc Af Amer: 59 mL/min — ABNORMAL LOW (ref >60)
GFR calc non Af Amer: 51 mL/min — ABNORMAL LOW (ref >60)
Glucose, Bld: 88 mg/dL (ref 70–99)
Potassium: 3.1 mmol/L — ABNORMAL LOW (ref 3.5–5.1)
Sodium: 142 mmol/L (ref 135–145)

## 2019-05-04 LAB — URINE CULTURE: Culture: NO GROWTH

## 2019-05-04 LAB — PHOSPHORUS: Phosphorus: 3.2 mg/dL (ref 2.5–4.6)

## 2019-05-04 LAB — MAGNESIUM: Magnesium: 1.6 mg/dL — ABNORMAL LOW (ref 1.7–2.4)

## 2019-05-04 MED ORDER — MAGNESIUM SULFATE 2 GM/50ML IV SOLN
2.0000 g | Freq: Once | INTRAVENOUS | Status: AC
  Administered 2019-05-04: 2 g via INTRAVENOUS
  Filled 2019-05-04: qty 50

## 2019-05-04 MED ORDER — QUETIAPINE FUMARATE 25 MG PO TABS
25.0000 mg | ORAL_TABLET | Freq: Every day | ORAL | Status: DC
  Administered 2019-05-04 – 2019-05-09 (×6): 25 mg via ORAL
  Filled 2019-05-04 (×6): qty 1

## 2019-05-04 MED ORDER — POTASSIUM CHLORIDE 20 MEQ PO PACK
20.0000 meq | PACK | Freq: Two times a day (BID) | ORAL | Status: DC
  Administered 2019-05-04 – 2019-05-06 (×4): 20 meq via ORAL
  Filled 2019-05-04 (×5): qty 1

## 2019-05-04 MED ORDER — POTASSIUM CHLORIDE 10 MEQ/100ML IV SOLN
10.0000 meq | INTRAVENOUS | Status: AC
  Administered 2019-05-04 (×4): 10 meq via INTRAVENOUS
  Filled 2019-05-04 (×4): qty 100

## 2019-05-04 NOTE — Progress Notes (Signed)
Patient ID: Andrea Schaefer, female   DOB: 03/15/1924, 86 y.o.   MRN: [id number] [facility] PROGRESS NOTE  Andrea Schaefer [id number] DOB: [birth year] DOA: 05/02/2019 PCP: [name], MD  HPI/Subjective: Patient a little confused this morning.  Answers a few questions but falls back asleep.  Follows some simple commands.  Objective: Vitals:   05/04/19 1400 05/04/19 1500  BP: (!) 119/57 119/87  Pulse: 67 72  Resp: 18 14  Temp:    SpO2: 92% 92%    Intake/Output Summary (Last 24 hours) at 05/04/2019 1553 Last data filed at 05/04/2019 1300 Gross per 24 hour  Intake 1013.41 ml  Output 2300 ml  Net -1286.59 ml   Filed Weights   05/02/19 1625  Weight: 90.7 kg    ROS: Review of Systems  Unable to perform ROS: Acuity of condition  Respiratory: Positive for shortness of breath.   Cardiovascular: Positive for chest pain.  Gastrointestinal: Negative for abdominal pain.  Genitourinary: Negative for dysuria.  Musculoskeletal: Negative for joint pain.  Neurological: Negative for headaches.   Exam: Physical Exam  HENT:  Nose: No mucosal edema.  Mouth/Throat: No oropharyngeal exudate or posterior oropharyngeal edema.  Eyes: Conjunctivae and lids are normal.  Cardiovascular: S1 normal and S2 normal. Exam reveals no gallop.  No murmur heard. Respiratory: No respiratory distress. She has decreased breath sounds in the right lower field, the left middle field and the left lower field. She has no wheezes. She has rhonchi in the left lower field. She has rales in the right lower field.  GI: Soft. Bowel sounds are normal. There is no abdominal tenderness.  Musculoskeletal:     Right ankle: Swelling present.     Left ankle: Swelling present.  Lymphadenopathy:    She has no cervical adenopathy.  Neurological: She is alert. No cranial nerve deficit.  Skin: Skin is warm. Nails show no clubbing.  Bruising right arm  Psychiatric: She has a normal mood and affect.      Data  Reviewed: Basic Metabolic Panel: Recent Labs  Lab 05/02/19 1634 05/02/19 2246 05/03/19 0538 05/04/19 0352  NA 140  --  142 142  K 3.7  --  3.7 3.1*  CL 103  --  105 98  CO2 29  --  29 34*  GLUCOSE 120*  --  91 88  BUN 15  --  15 15  CREATININE 1.07* 1.05* 0.97 0.97  CALCIUM 9.2  --  8.8* 8.5*  MG  --   --   --  1.6*  PHOS  --   --   --  3.2   Liver Function Tests: Recent Labs  Lab 05/02/19 1634  AST 16  ALT 12  ALKPHOS 79  BILITOT 0.9  PROT 7.5  ALBUMIN 3.7   CBC: Recent Labs  Lab 05/02/19 1634 05/02/19 2246 05/03/19 0538 05/04/19 0352  WBC 8.7 9.8 12.3* 8.7  NEUTROABS 6.8  --   --  5.9  HGB 13.1 12.0 12.2 12.0  HCT 43.3 40.1 40.6 39.4  MCV 95.6 95.7 96.0 93.6  PLT 267 247 241 237   BNP (last 3 results) Recent Labs    05/02/19 1634  BNP 469.0*     Recent Results (from the past 240 hour(s))  Blood culture (single)     Status: None (Preliminary result)   Collection Time: 05/02/19  4:35 PM   Specimen: BLOOD  Result Value Ref Range Status   Specimen Description BLOOD RIGHT ANTECUBITAL  Final   Special  Requests   Final    BOTTLES DRAWN AEROBIC AND ANAEROBIC Blood Culture adequate volume   Culture   Final    NO GROWTH 2 DAYS Performed at [facility], [address] [place], [place] [address]    Report Status PENDING  Incomplete  Blood culture (single)     Status: None (Preliminary result)   Collection Time: 05/02/19  4:54 PM   Specimen: BLOOD  Result Value Ref Range Status   Specimen Description BLOOD BLOOD LEFT HAND  Final   Special Requests   Final    BOTTLES DRAWN AEROBIC AND ANAEROBIC Blood Culture results may not be optimal due to an inadequate volume of blood received in culture bottles   Culture   Final    NO GROWTH 2 DAYS Performed at [facility], [address], [place], [place] [address]    Report Status PENDING  Incomplete  Respiratory Panel by RT PCR (Flu A&B, Covid) - Nasopharyngeal Swab     Status: None    Collection Time: 05/02/19  5:05 PM   Specimen: Nasopharyngeal Swab  Result Value Ref Range Status   SARS Coronavirus 2 by RT PCR NEGATIVE NEGATIVE Final    Comment: (NOTE) SARS-CoV-2 target nucleic acids are NOT DETECTED. The SARS-CoV-2 RNA is generally detectable in upper respiratoy specimens during the acute phase of infection. The lowest concentration of SARS-CoV-2 viral copies this assay can detect is 131 copies/mL. A negative result does not preclude SARS-Cov-2 infection and should not be used as the sole basis for treatment or other patient management decisions. A negative result may occur with  improper specimen collection/handling, submission of specimen other than nasopharyngeal swab, presence of viral mutation(s) within the areas targeted by this assay, and inadequate number of viral copies (<131 copies/mL). A negative result must be combined with clinical observations, patient history, and epidemiological information. The expected result is Negative. Fact Sheet for Patients:  PinkCheek.be Fact Sheet for Healthcare Providers:  GravelBags.it This test is not yet ap proved or cleared by the [place] FDA and  has been authorized for detection and/or diagnosis of SARS-CoV-2 by FDA under an Emergency Use Authorization (EUA). This EUA will remain  in effect (meaning this test can be used) for the duration of the COVID-19 declaration under Section 564(b)(1) of the Act, 21 U.S.C. section 360bbb-3(b)(1), unless the authorization is terminated or revoked sooner.    Influenza A by PCR NEGATIVE NEGATIVE Final   Influenza B by PCR NEGATIVE NEGATIVE Final    Comment: (NOTE) The Xpert Xpress SARS-CoV-2/FLU/RSV assay is intended as an aid in  the diagnosis of influenza from Nasopharyngeal swab specimens and  should not be used as a sole basis for treatment. Nasal washings and  aspirates are unacceptable for Xpert Xpress  SARS-CoV-2/FLU/RSV  testing. Fact Sheet for Patients: PinkCheek.be Fact Sheet for Healthcare Providers: GravelBags.it This test is not yet approved or cleared by the [place] FDA and  has been authorized for detection and/or diagnosis of SARS-CoV-2 by  FDA under an Emergency Use Authorization (EUA). This EUA will remain  in effect (meaning this test can be used) for the duration of the  Covid-19 declaration under Section 564(b)(1) of the Act, 21  U.S.C. section 360bbb-3(b)(1), unless the authorization is  terminated or revoked. Performed at [facility], [address] [place], [place] [address]   Body fluid culture (includes gram stain)     Status: None (Preliminary result)   Collection Time: 05/02/19  8:31 PM  Specimen: Pleural Fluid  Result Value Ref Range Status   Specimen Description   Final    PLEURAL Performed at [facility], [address] [place], [place] [address]    Special Requests   Final    NONE Performed at [facility], [address] [place], [place] [address]    Gram Stain   Final    FEW WBC PRESENT, PREDOMINANTLY MONONUCLEAR NO ORGANISMS SEEN    Culture   Final    NO GROWTH 2 DAYS Performed at [facility], [place] [address], [place], [place] [address]    Report Status PENDING  Incomplete  Urine culture     Status: None   Collection Time: 05/03/19  4:15 AM   Specimen: In/Out Cath Urine  Result Value Ref Range Status   Specimen Description   Final    IN/OUT CATH URINE Performed at [facility], [address] [place], [place] [address]    Special Requests   Final    NONE Performed at [facility], [address] [place], [place] [address]    Culture   Final    NO GROWTH Performed at [facility], [place] [address], [place], [place] [address]    Report Status 05/04/2019 FINAL  Final  MRSA PCR Screening     Status: None    Collection Time: 05/03/19  8:08 PM   Specimen: Nasal Mucosa; Nasopharyngeal  Result Value Ref Range Status   MRSA by PCR NEGATIVE NEGATIVE Final    Comment:        The GeneXpert MRSA Assay (FDA approved for NASAL specimens only), is one component of a comprehensive MRSA colonization surveillance program. It is not intended to diagnose MRSA infection nor to guide or monitor treatment for MRSA infections. Performed at [facility], [address], [place], [place] [address]      Studies: [facility] Chest [facility] 1 View  Result Date: 05/04/2019 CLINICAL DATA:  Acute respiratory failure EXAM: PORTABLE CHEST 1 VIEW COMPARISON:  May 02, 2019 FINDINGS: A left-sided chest tube is identified, stable. Stable cardiomegaly. The hila and mediastinum are unchanged. No pneumothorax. Increasing opacity in the right base with lower lung volumes on the right. Mild interstitial central opacities in the lungs without overt edema. Opacity in left retrocardiac region with obscuration left hemidiaphragm has increased. No other acute interval changes. IMPRESSION: 1. Increasing opacity in the right base could represent atelectasis or infiltrate. 2. Increasing opacity in left base is favored represent atelectasis. 3. Suggested pulmonary venous congestion versus minimal edema. Electronically Signed   By: Dorise Bullion III M.D   On: 05/04/2019 08:17   DG Chest Portable 1 View  Result Date: 05/02/2019 CLINICAL DATA:  Post chest tube placement EXAM: PORTABLE CHEST 1 VIEW COMPARISON:  05/02/2019, 02/06/2017 FINDINGS: Interim placement of left lower chest tube with the tip projecting over the central left lower lung. Significant decrease in left pleural effusion with small residual. Small right-sided pleural effusion. Enlarged cardiomediastinal silhouette with vascular congestion. Hazy and interstitial opacities likely due to edema. Residual partial atelectasis at the left base. No pneumothorax. Aortic atherosclerosis.  IMPRESSION: 1. Placement of left-sided chest tube with decreased left pleural effusion and improved aeration of left thorax. Small residual left pleural effusion and partial atelectasis at the left base. 2. Small right pleural effusion with platelike atelectasis at the right base 3. Cardiomegaly with vascular congestion and pulmonary edema Electronically Signed   By: Madie Reno.D.  On: 05/02/2019 21:19   DG Chest Portable 1 View  Result Date: 05/02/2019 CLINICAL DATA:  Respiratory distress. EXAM: PORTABLE CHEST 1 VIEW COMPARISON:  02/06/2017 FINDINGS: Lungs are hypoinflated with near complete opacification of the left lung with minimal aeration over the left upper lobe. Patchy hazy opacification of the right lung. Minimal blunting of the right costophrenic angle. Left heart border is obscured. Remainder of the exam is unchanged. IMPRESSION: Findings likely due to large left effusion with associated atelectasis. Underlying mass or infection is possible. Patchy hazy opacification over the right lung which may be due to edema or infection. Possible small amount right pleural fluid. Electronically Signed   By: Marin Olp M.D.   On: 05/02/2019 16:37   ECHOCARDIOGRAM COMPLETE  Result Date: 05/03/2019    ECHOCARDIOGRAM REPORT   Patient Name:   Andrea Schaefer Date of Exam: 05/03/2019 Medical Rec #:  [id number]   Height:       65.0 in Accession #:    [id number]  Weight:       200.0 lb Date of Birth:  02-12-1925   BSA:          1.978 m Patient Age:    42 years    BP:           120/59 mmHg Patient Gender: F           HR:           67 bpm. Exam Location:  [facility] Procedure: 2D Echo, Cardiac Doppler and Color Doppler Indications:     CHF-acute diastolic [id number]  History:         Patient has no prior history of Echocardiogram examinations.                  Risk Factors:Hypertension.  Sonographer:     Sherrie Sport RDCS (AE) Referring Phys:  [id number] Athena Masse Diagnosing Phys: Nelva Bush MD  Sonographer Comments:  Suboptimal apical window. IMPRESSIONS  1. Left ventricular ejection fraction, by estimation, is 60 to 65%. The left ventricle has normal function. The left ventricle has no regional wall motion abnormalities. There is moderate left ventricular hypertrophy. Left ventricular diastolic parameters are consistent with Grade I diastolic dysfunction (impaired relaxation). Elevated left atrial pressure.  2. There is moderate pulmonary hypertension (PASP 40-45 mmHg plus central venous pressure). Right ventricular systolic function is low normal. The right ventricular size is moderately enlarged. mildly increased right ventricular wall thickness.  3. Right atrial size was mildly dilated.  4. Question small posterior pericardial effusion, though internal echos raise possibility of complex fluid or soft tissue in the pericardial space.. The pericardial effusion is posterior to the left ventricle.  5. The mitral valve is rheumatic. Trivial mitral valve regurgitation. No evidence of mitral stenosis.  6. Tricuspid valve regurgitation is mild to moderate.  7. The aortic valve is tricuspid. Aortic valve regurgitation is not visualized. Mild aortic valve sclerosis is present, with no evidence of aortic valve stenosis. FINDINGS  Left Ventricle: Left ventricular ejection fraction, by estimation, is 60 to 65%. The left ventricle has normal function. The left ventricle has no regional wall motion abnormalities. The left ventricular internal cavity size was normal in size. There is  moderate left ventricular hypertrophy. Left ventricular diastolic parameters are consistent with Grade I diastolic dysfunction (impaired relaxation). Elevated left atrial pressure. Right Ventricle: There is moderate pulmonary hypertension (PASP 40-45 mmHg plus central venous pressure). The right ventricular size is moderately enlarged. Mildly increased right ventricular wall  thickness. Right ventricular systolic function is low normal. Left Atrium: Left atrial  size was normal in size. Right Atrium: Right atrial size was mildly dilated. Pericardium: Question small posterior pericardial effusion, though internal echos raise possibility of complex fluid or soft tissue in the pericardial space. The pericardium was not well visualized. The pericardial effusion is posterior to the left ventricle. Mitral Valve: The mitral valve is rheumatic. Trivial mitral valve regurgitation. No evidence of mitral valve stenosis. Tricuspid Valve: The tricuspid valve is normal in structure. Tricuspid valve regurgitation is mild to moderate. Aortic Valve: The aortic valve is tricuspid. . There is mild thickening and mild calcification of the aortic valve. Aortic valve regurgitation is not visualized. Mild aortic valve sclerosis is present, with no evidence of aortic valve stenosis. There is mild thickening of the aortic valve. There is mild calcification of the aortic valve. Aortic valve mean gradient measures 5.3 mmHg. Aortic valve peak gradient measures 9.3 mmHg. Aortic valve area, by VTI measures 1.78 cm. Pulmonic Valve: The pulmonic valve was not well visualized. Pulmonic valve regurgitation is mild. No evidence of pulmonic stenosis. Aorta: The aortic root is normal in size and structure. Pulmonary Artery: The pulmonary artery is not well seen. Venous: The inferior vena cava was not well visualized. IAS/Shunts: The interatrial septum was not well visualized. Additional Comments: There is pleural effusion in the left lateral region.  LEFT VENTRICLE PLAX 2D LVIDd:         3.72 cm  Diastology LVIDs:         2.06 cm  LV e' lateral:   3.92 cm/s LV PW:         1.23 cm  LV E/e' lateral: 19.1 LV IVS:        1.11 cm  LV e' medial:    3.92 cm/s LVOT diam:     2.00 cm  LV E/e' medial:  19.1 LV SV:         55 LV SV Index:   28 LVOT Area:     3.14 cm  RIGHT VENTRICLE RV Basal diam:  5.01 cm RV S prime:     10.60 cm/s TAPSE (M-mode): 2.1 cm LEFT ATRIUM             Index       RIGHT ATRIUM           Index  LA diam:        2.60 cm 1.31 cm/m  RA Area:     21.50 cm LA Vol (A2C):   80.1 ml 40.49 ml/m RA Volume:   61.40 ml  31.04 ml/m LA Vol (A4C):   27.9 ml 14.10 ml/m LA Biplane Vol: 48.7 ml 24.62 ml/m  AORTIC VALVE                    PULMONIC VALVE AV Area (Vmax):    1.54 cm     PV Vmax:        0.55 m/s AV Area (Vmean):   1.36 cm     PV Peak grad:   1.2 mmHg AV Area (VTI):     1.78 cm     RVOT Peak grad: 2 mmHg AV Vmax:           152.67 cm/s AV Vmean:          106.433 cm/s AV VTI:            0.311 m AV Peak Grad:      9.3 mmHg AV Mean  Grad:      5.3 mmHg LVOT Vmax:         74.60 cm/s LVOT Vmean:        46.100 cm/s LVOT VTI:          0.176 m LVOT/AV VTI ratio: 0.57  AORTA Ao Root diam: 2.90 cm MITRAL VALVE               TRICUSPID VALVE MV Area (PHT): 2.18 cm    TR Peak grad:   44.4 mmHg MV Decel Time: 348 msec    TR Vmax:        333.00 cm/s MV E velocity: 74.70 cm/s MV A velocity: 85.40 cm/s  SHUNTS MV E/A ratio:  0.87        Systemic VTI:  0.18 m                            Systemic Diam: 2.00 cm Nelva Bush MD Electronically signed by Nelva Bush MD Signature Date/Time: 05/03/2019/3:57:27 PM    Final     Scheduled Meds: . atorvastatin  20 mg Oral Daily  . azithromycin  250 mg Oral Daily  . buPROPion  150 mg Oral Daily  . chlorhexidine  15 mL Mouth Rinse BID  . Chlorhexidine Gluconate Cloth  6 each Topical Daily  . enoxaparin (LOVENOX) injection  40 mg Subcutaneous Q24H  . escitalopram  20 mg Oral Daily  . furosemide  40 mg Intravenous BID  . levothyroxine  125 mcg Oral QAC breakfast  . mouth rinse  15 mL Mouth Rinse q12n4p  . metoprolol tartrate  50 mg Oral BID  . potassium chloride  20 mEq Oral BID  . QUEtiapine  25 mg Oral QHS   Continuous Infusions: . meropenem (MERREM) IV Stopped (05/04/19 [id number])    Assessment/Plan:  1. Acute on chronic hypoxic respiratory failure.  Patient currently on 6 L high flow nasal cannula.  2. Large left pleural effusion status post chest tube  placement.  Chest tube to stay in over the weekend as per cardiothoracic surgery 3. Acute diastolic CHF.  IV Lasix ordered.  Patient on metoprolol. 4. Lobar pneumonia on meropenem and Zithromax 5. Hypokalemia and hypomagnesemia replace magnesium IV.  Potassium replaced IV and orally. 6. Essential hypertension on metoprolol.  Holding amlodipine and hydralazine 7. Hypothyroidism unspecified on levothyroxine 8. Anxiety depression on Lexapro 9. Hyperlipidemia unspecified on atorvastatin 10. Confusion and insomnia.  Seroquel at night to get the patient sleeping  Code Status:     Code Status Orders  (From admission, onward)         Start     Ordered   05/02/19 2116  Full code  Continuous     05/02/19 2131        Code Status History    Date Active Date Inactive Code Status Order ID Comments User Context   02/16/2018 1921 02/18/2018 1826 Full Code [id number]  Hillary Bow, MD ED   02/06/2017 2332 02/11/2017 1448 Full Code [id number]  Max Sane, MD Inpatient   09/08/2015 2101 09/11/2015 1243 Full Code [id number]  Max Sane, MD Inpatient   09/12/2014 2025 09/18/2014 1609 Full Code [id number]  Hower, Aaron Mose, MD ED   Advance Care Planning Activity    Advance Directive Documentation     Most Recent Value  Type of Advance Directive  Healthcare Power of Attorney  Pre-existing out of facility DNR order (yellow form or pink MOST form)  --  "  MOST" Form in Place?  --     Family Communication: Spoke with son on the phone Disposition Plan: Patient has a chest tube plan and they will be no disposition while the chest tube is in.  Cardiothoracic surgery wanted to leave the chest tube in over the weekend and reassess on Monday.  Consultants:  Critical care specialist  Cardiothoracic surgery  Procedures: -Chest tube placement  Antibiotics:  Meropenem  Zithromax  Time spent: 28 minutes  [place]

## 2019-05-04 NOTE — Consult Note (Addendum)
[name] MRN: [id number] DOB: 1924-06-14    ADMISSION DATE:  05/02/2019 CONSULTATION DATE: 05/03/2019  REFERRING MD : Dr. Leslye Peer   CHIEF COMPLAINT: Shortness of Breath   BRIEF PATIENT DESCRIPTION:  86 yo female admitted with acute on chronic hypoxic respiratory failure secondary to large left sided pleural effusion requiring left sided chest tube and acute on chronic diastolic CHF   SIGNIFICANT EVENTS/STUDIES:  03/19: Pt admitted to the stepdown unit with left sided chest tube   3/21 - patient is clinically improved with less supplemental oxygen.  There is minimal output through chest tube.    HISTORY OF PRESENT ILLNESS:   This is a 86 yo female with a PMH of Hypothyroidism, Seborrheic Dermatitis, Pericarditis, Osteoporosis, HTN, Hyperlipidemia, Rosacea, Poliomyelitis, Right Breat Cancer s/p radiation, Depression, Gout, Cortical Cataract, Anxiety, and Arthritis.  She presented to [facility] ER via EMS on 03/18 from home after her son noticed she was having worsening shortness of breath, onset of symptoms 1 week prior to presentation.  At baseline she ambulates with a walker.  EMS reported when they arrived at pts home she was hypoxic with O2 sats 86% on RA requiring 15L NRB.  Upon arrival to the ER pt noted to have labored respirations with O2 sats 96% on NRB.  CXR revealed a large left pleural effusion with compressive atelectasis requiring left sided chest tube placement with return of 1.2L of serosanguinous pleural fluid.  She was subsequently admitted to the stepdown unit per hospitalist team for additional workup and treatment.  PCCM team and Cardiothoracic Surgery consulted to assist with management.    PAST MEDICAL HISTORY :   has a past medical history of Anxiety, Arthritis, Cancer ([place]) (2011), Cataract cortical, senile, Depression, Gout, History of breast cancer, History of poliomyelitis, History of rosacea, Hyperlipidemia, unspecified, Hypertension, Hypothyroidism,  Osteoporosis, post-menopausal, Pericarditis, Personal history of radiation therapy, Renal insufficiency, Seborrheic dermatitis, and Thyroid disease.  has a past surgical history that includes Back surgery; Breast biopsy (Right, 2011); Cataract extraction (Left, 2008); Cardiac catheterization (03/15/2005); Fracture surgery (Left, 05/17/2005); Fracture surgery (Left, 05/2005); Fracture surgery (04/2004); Excision basal cell carcinoma (Left); and Mastectomy partial / lumpectomy (Right, 2011). Prior to Admission medications   Medication Sig Start Date End Date Taking? Authorizing Provider  acetaminophen (TYLENOL) 325 MG tablet Take 2 tablets (650 mg total) by mouth every 6 (six) hours as needed for mild pain, moderate pain or headache (headache). 09/11/15  Yes Gladstone Lighter, MD  amLODipine (NORVASC) 10 MG tablet Take 1 tablet (10 mg total) by mouth daily. 09/18/14  Yes Demetrios Loll, MD  atorvastatin (LIPITOR) 20 MG tablet Take 20 mg by mouth daily.   Yes [provider]  buPROPion (WELLBUTRIN XL) 150 MG 24 hr tablet Take 150 mg by mouth daily.   Yes [provider]  cholecalciferol (VITAMIN D) 1000 units tablet Take 2,000 Units by mouth daily.   Yes [provider]  escitalopram (LEXAPRO) 20 MG tablet Take 20 mg by mouth daily.    Yes [provider]  hydrALAZINE (APRESOLINE) 25 MG tablet Take 1 tablet (25 mg total) by mouth 3 (three) times daily. 09/18/14  Yes Demetrios Loll, MD  levothyroxine (SYNTHROID) 125 MCG tablet Take 125 mcg by mouth daily before breakfast.    Yes [provider]  metoprolol (LOPRESSOR) 50 MG tablet Take 1 tablet (50 mg total) by mouth 2 (two) times daily. 09/18/14  Yes Demetrios Loll, MD  polyethylene glycol [facility] / Floria Raveling) packet Take 17 g  by mouth daily as needed for moderate constipation or severe constipation. 02/10/17  Yes Wieting, Richard, MD  potassium chloride (MICRO-K) 10 MEQ CR capsule Take 10 mEq by mouth daily.   Yes [provider]   Allergies  Allergen Reactions  . Codeine Nausea And Vomiting  . Tetracyclines & Related Nausea And Vomiting  . Adhesive [Tape] Rash  . Keflex [Cephalexin] Rash  . Naproxen Rash    Patient was on both Keflex and Naproxen when developed rash -  unclear as to which caused rash    FAMILY HISTORY:  family history includes Alzheimer's disease in her brother and mother; Coronary artery disease in her mother; Hypertension in her mother and another family member; Lung cancer in her father. SOCIAL HISTORY:  reports that she has never smoked. She has never used smokeless tobacco. She reports that she does not drink alcohol or use drugs.  REVIEW OF SYSTEMS:  Positives in BOLD  Gen: Denies fever, chills, weight change, fatigue, night sweats HEENT: Denies blurred vision, double vision, hearing loss, tinnitus, sinus congestion, rhinorrhea, sore throat, neck stiffness, dysphagia PULM: shortness of breath, cough, sputum production, hemoptysis, wheezing CV: left sided chest pain at chest tube site, edema, orthopnea, paroxysmal nocturnal dyspnea, palpitations GI: Denies abdominal pain, nausea, vomiting, diarrhea, hematochezia, melena, constipation, change in bowel habits GU: Denies dysuria, hematuria, polyuria, oliguria, urethral discharge Endocrine: Denies hot or cold intolerance, polyuria, polyphagia or appetite change Derm: Denies rash, dry skin, scaling or peeling skin change Heme: Denies easy bruising, bleeding, bleeding gums Neuro: Denies headache, numbness, weakness, slurred speech, loss of memory or consciousness  SUBJECTIVE:  No complaints at this time   VITAL SIGNS: Temp:  [97.6 F (36.4 C)-98.5 F (36.9 C)] 98.5 F (36.9 C) (03/20 1300) Pulse Rate:  [62-117] 68 (03/20 1300) Resp:  [15-25] 25 (03/20 1300) BP: (113-149)/(45-93) 113/51 (03/20 1300) SpO2:  [91 %-98 %] 94 % (03/20 1300)  PHYSICAL EXAMINATION: General: acutely ill appearing elderly female, resting in bed  NAD  Neuro: alert and disoriented to situation at times, follows commands, PERRLA HEENT: supple, no JVD Cardiovascular: nsr, rrr, no R/G, left sided chest tube with serosanguinous drainage   Lungs: crackles throughout, diminished on the left, even, non labored  Abdomen: +BS x4, obese, soft, non tender, non distended  Musculoskeletal: 1+ bilateral lower extremity edema, moves all extremities  Skin: left sided chest tube incision site dressing clean/dry/intact   Recent Labs  Lab 05/02/19 1634 05/02/19 1634 05/02/19 2246 05/03/19 0538 05/04/19 0352  NA 140  --   --  142 142  K 3.7  --   --  3.7 3.1*  CL 103  --   --  105 98  CO2 29  --   --  29 34*  BUN 15  --   --  15 15  CREATININE 1.07*   < > 1.05* 0.97 0.97  GLUCOSE 120*  --   --  91 88   < > = values in this interval not displayed.   Recent Labs  Lab 05/02/19 2246 05/03/19 0538 05/04/19 0352  HGB 12.0 12.2 12.0  HCT 40.1 40.6 39.4  WBC 9.8 12.3* 8.7  PLT 247 241 237   DG Chest Port 1 View  Result Date: 05/04/2019 CLINICAL DATA:  Acute respiratory failure EXAM: PORTABLE CHEST 1 VIEW COMPARISON:  May 02, 2019 FINDINGS: A left-sided chest tube is identified, stable. Stable cardiomegaly. The hila and mediastinum are unchanged. No pneumothorax. Increasing opacity in the right base with  lower lung volumes on the right. Mild interstitial central opacities in the lungs without overt edema. Opacity in left retrocardiac region with obscuration left hemidiaphragm has increased. No other acute interval changes. IMPRESSION: 1. Increasing opacity in the right base could represent atelectasis or infiltrate. 2. Increasing opacity in left base is favored represent atelectasis. 3. Suggested pulmonary venous congestion versus minimal edema. Electronically Signed   By: Dorise Bullion III M.D   On: 05/04/2019 08:17   DG Chest Portable 1 View  Result Date: 05/02/2019 CLINICAL DATA:  Post chest tube placement EXAM: PORTABLE CHEST 1 VIEW  COMPARISON:  05/02/2019, 02/06/2017 FINDINGS: Interim placement of left lower chest tube with the tip projecting over the central left lower lung. Significant decrease in left pleural effusion with small residual. Small right-sided pleural effusion. Enlarged cardiomediastinal silhouette with vascular congestion. Hazy and interstitial opacities likely due to edema. Residual partial atelectasis at the left base. No pneumothorax. Aortic atherosclerosis. IMPRESSION: 1. Placement of left-sided chest tube with decreased left pleural effusion and improved aeration of left thorax. Small residual left pleural effusion and partial atelectasis at the left base. 2. Small right pleural effusion with platelike atelectasis at the right base 3. Cardiomegaly with vascular congestion and pulmonary edema Electronically Signed   By: Donavan Foil M.D.   On: 05/02/2019 21:19   DG Chest Portable 1 View  Result Date: 05/02/2019 CLINICAL DATA:  Respiratory distress. EXAM: PORTABLE CHEST 1 VIEW COMPARISON:  02/06/2017 FINDINGS: Lungs are hypoinflated with near complete opacification of the left lung with minimal aeration over the left upper lobe. Patchy hazy opacification of the right lung. Minimal blunting of the right costophrenic angle. Left heart border is obscured. Remainder of the exam is unchanged. IMPRESSION: Findings likely due to large left effusion with associated atelectasis. Underlying mass or infection is possible. Patchy hazy opacification over the right lung which may be due to edema or infection. Possible small amount right pleural fluid. Electronically Signed   By: Marin Olp M.D.   On: 05/02/2019 16:37   ECHOCARDIOGRAM COMPLETE  Result Date: 05/03/2019    ECHOCARDIOGRAM REPORT   Patient Name:   Andrea Schaefer Date of Exam: 05/03/2019 Medical Rec #:  [id number]   Height:       65.0 in Accession #:    [id number]  Weight:       200.0 lb Date of Birth:  03-17-1924   BSA:          1.978 m Patient Age:    20 years    BP:            120/59 mmHg Patient Gender: F           HR:           67 bpm. Exam Location:  [facility] Procedure: 2D Echo, Cardiac Doppler and Color Doppler Indications:     CHF-acute diastolic [id number]  History:         Patient has no prior history of Echocardiogram examinations.                  Risk Factors:Hypertension.  Sonographer:     Sherrie Sport RDCS (AE) Referring Phys:  [id number] Athena Masse Diagnosing Phys: Nelva Bush MD  Sonographer Comments: Suboptimal apical window. IMPRESSIONS  1. Left ventricular ejection fraction, by estimation, is 60 to 65%. The left ventricle has normal function. The left ventricle has no regional wall motion abnormalities. There is moderate left ventricular hypertrophy. Left ventricular diastolic parameters are consistent with  Grade I diastolic dysfunction (impaired relaxation). Elevated left atrial pressure.  2. There is moderate pulmonary hypertension (PASP 40-45 mmHg plus central venous pressure). Right ventricular systolic function is low normal. The right ventricular size is moderately enlarged. mildly increased right ventricular wall thickness.  3. Right atrial size was mildly dilated.  4. Question small posterior pericardial effusion, though internal echos raise possibility of complex fluid or soft tissue in the pericardial space.. The pericardial effusion is posterior to the left ventricle.  5. The mitral valve is rheumatic. Trivial mitral valve regurgitation. No evidence of mitral stenosis.  6. Tricuspid valve regurgitation is mild to moderate.  7. The aortic valve is tricuspid. Aortic valve regurgitation is not visualized. Mild aortic valve sclerosis is present, with no evidence of aortic valve stenosis. FINDINGS  Left Ventricle: Left ventricular ejection fraction, by estimation, is 60 to 65%. The left ventricle has normal function. The left ventricle has no regional wall motion abnormalities. The left ventricular internal cavity size was normal in size. There is  moderate left  ventricular hypertrophy. Left ventricular diastolic parameters are consistent with Grade I diastolic dysfunction (impaired relaxation). Elevated left atrial pressure. Right Ventricle: There is moderate pulmonary hypertension (PASP 40-45 mmHg plus central venous pressure). The right ventricular size is moderately enlarged. Mildly increased right ventricular wall thickness. Right ventricular systolic function is low normal. Left Atrium: Left atrial size was normal in size. Right Atrium: Right atrial size was mildly dilated. Pericardium: Question small posterior pericardial effusion, though internal echos raise possibility of complex fluid or soft tissue in the pericardial space. The pericardium was not well visualized. The pericardial effusion is posterior to the left ventricle. Mitral Valve: The mitral valve is rheumatic. Trivial mitral valve regurgitation. No evidence of mitral valve stenosis. Tricuspid Valve: The tricuspid valve is normal in structure. Tricuspid valve regurgitation is mild to moderate. Aortic Valve: The aortic valve is tricuspid. . There is mild thickening and mild calcification of the aortic valve. Aortic valve regurgitation is not visualized. Mild aortic valve sclerosis is present, with no evidence of aortic valve stenosis. There is mild thickening of the aortic valve. There is mild calcification of the aortic valve. Aortic valve mean gradient measures 5.3 mmHg. Aortic valve peak gradient measures 9.3 mmHg. Aortic valve area, by VTI measures 1.78 cm. Pulmonic Valve: The pulmonic valve was not well visualized. Pulmonic valve regurgitation is mild. No evidence of pulmonic stenosis. Aorta: The aortic root is normal in size and structure. Pulmonary Artery: The pulmonary artery is not well seen. Venous: The inferior vena cava was not well visualized. IAS/Shunts: The interatrial septum was not well visualized. Additional Comments: There is pleural effusion in the left lateral region.  LEFT VENTRICLE  PLAX 2D LVIDd:         3.72 cm  Diastology LVIDs:         2.06 cm  LV e' lateral:   3.92 cm/s LV PW:         1.23 cm  LV E/e' lateral: 19.1 LV IVS:        1.11 cm  LV e' medial:    3.92 cm/s LVOT diam:     2.00 cm  LV E/e' medial:  19.1 LV SV:         55 LV SV Index:   28 LVOT Area:     3.14 cm  RIGHT VENTRICLE RV Basal diam:  5.01 cm RV S prime:     10.60 cm/s TAPSE (M-mode): 2.1 cm LEFT ATRIUM  Index       RIGHT ATRIUM           Index LA diam:        2.60 cm 1.31 cm/m  RA Area:     21.50 cm LA Vol (A2C):   80.1 ml 40.49 ml/m RA Volume:   61.40 ml  31.04 ml/m LA Vol (A4C):   27.9 ml 14.10 ml/m LA Biplane Vol: 48.7 ml 24.62 ml/m  AORTIC VALVE                    PULMONIC VALVE AV Area (Vmax):    1.54 cm     PV Vmax:        0.55 m/s AV Area (Vmean):   1.36 cm     PV Peak grad:   1.2 mmHg AV Area (VTI):     1.78 cm     RVOT Peak grad: 2 mmHg AV Vmax:           152.67 cm/s AV Vmean:          106.433 cm/s AV VTI:            0.311 m AV Peak Grad:      9.3 mmHg AV Mean Grad:      5.3 mmHg LVOT Vmax:         74.60 cm/s LVOT Vmean:        46.100 cm/s LVOT VTI:          0.176 m LVOT/AV VTI ratio: 0.57  AORTA Ao Root diam: 2.90 cm MITRAL VALVE               TRICUSPID VALVE MV Area (PHT): 2.18 cm    TR Peak grad:   44.4 mmHg MV Decel Time: 348 msec    TR Vmax:        333.00 cm/s MV E velocity: 74.70 cm/s MV A velocity: 85.40 cm/s  SHUNTS MV E/A ratio:  0.87        Systemic VTI:  0.18 m                            Systemic Diam: 2.00 cm Nelva Bush MD Electronically signed by Nelva Bush MD Signature Date/Time: 05/03/2019/3:57:27 PM    Final     ASSESSMENT / PLAN: Acute on chronic hypoxic respiratory failure secondary to left sided pleural effusion and acute on chronic diastolic CHF  Supplemental O2 for dyspnea and/or hypoxia  Prn bronchodilator therapy  Per cardiothoracic surgery recommendations-chest tube to remain in place with suction at 20 cm of water for now Continue IV lasix Trend WBC  and monitor fever curve Continue meropenem  Follow cultures  Aggressive pulmonary hygiene  Trend BMP  Replace electrolytes as indicated  Monitor UOP Avoid nephrotoxic medications   Best Practice: VTE px: subq lovenox SUP px: not indicated  Diet: Heart Healthy     Ottie Glazier, M.D.  [facility] [place]

## 2019-05-04 NOTE — Progress Notes (Signed)
Patient a little confused this morning, but able to reorient and state she was at the hospital for "trouble breathing." Chest tube still in place with suction at 20 and no air leaks noted. Encouraged pulmonary exercises and mobility throughout the day, with patient being even able to stand with PT at bedside. Appetite was poor throughout the day, 25% of dinner, boosts and ensures encouraged. Diuresing with lasix well. Pain managed with oxycodone.

## 2019-05-04 NOTE — Evaluation (Signed)
Physical Therapy Evaluation Patient Details [name] MRN: [id number] DOB: 30-Apr-1924 Today's Date: 05/04/2019   History of Present Illness  86 yo female with a PMH of Hypothyroidism, Seborrheic Dermatitis, Pericarditis, Osteoporosis, HTN, Hyperlipidemia, Rosacea, Poliomyelitis, Right Breat Cancer s/p radiation, Depression, Gout, Cortical Cataract, Anxiety, and Arthritis.  She presented to [facility] ER via EMS on 03/18 from home after her son noticed she was having worsening shortness of breath, onset of symptoms 1 week prior to presentation.  At baseline she ambulates with a walker.    Clinical Impression  Pt initially hesitant to do a lot but family, nurse and PT encouragement did convince her to participate more readily.  She c/o pain in L flank (chest tube site) consistently t/o the session but overall was able to tolerate mobility and strength testing relatively well.  She had some confusion but appears to be near her baseline, pt able to sit at EOB w/o assist and did manage to stand ~30 seconds (pt reports urine incontinence and sat herself down, though there was no sign of urine after the fact).  Pt's O2 remained >85% and generally ~90% t/o the effort on 6L, pt will require STR when medically ready to d/c but ultimately did better than expected on initial PT assessment.    Follow Up Recommendations SNF    Equipment Recommendations  None recommended by PT    Recommendations for Other Services       Precautions / Restrictions Precautions Precautions: Fall Restrictions Weight Bearing Restrictions: No      Mobility  Bed Mobility Overal bed mobility: Needs Assistance Bed Mobility: Supine to Sit;Sit to Supine     Supine to sit: Mod assist Sit to supine: Mod assist;Max assist   General bed mobility comments: Pt hesitant to get to sitting but was able to give some assist with transition to sitting, able to scoot at EOB with only light assist  Transfers Overall transfer level:  Needs assistance Equipment used: Rolling walker (2 wheeled) Transfers: Sit to/from Stand Sit to Stand: Min assist         General transfer comment: Pt did surprisingly well in getting to standing despite some hesitation.  Relatively high bed, but did not need excessive assist to transition to standing with walker.  Ambulation/Gait             General Gait Details: unsafe to attempt this date  Stairs            Wheelchair Mobility    Modified Rankin (Stroke Patients Only)       Balance Overall balance assessment: Independent                                           Pertinent Vitals/Pain Pain Assessment: Faces Faces Pain Scale: Hurts even more Pain Location: L ribs/tube insertion site    Home Living Family/patient expects to be discharged to:: Skilled nursing facility                 Additional Comments: lives with son, uses walker for in-home ambulation    Prior Function Level of Independence: Needs assistance         Comments: until this week had been able to ambulate in home with walker, manage most ADLs     Hand Dominance        Extremity/Trunk Assessment   Upper Extremity Assessment Upper Extremity  Assessment: Generalized weakness(L pain limited (chest tube), age appropriate limitations)    Lower Extremity Assessment Lower Extremity Assessment: Generalized weakness(age appropriate limitations, grossly 3+/5)       Communication   Communication: No difficulties  Cognition Arousal/Alertness: Awake/alert Behavior During Therapy: WFL for tasks assessed/performed Overall Cognitive Status: History of cognitive impairments - at baseline                                 General Comments: some baseline confusion, knew she was in hospital and alert to self, unsure of date, situation      General Comments      Exercises     Assessment/Plan    PT Assessment Patient needs continued PT services  PT  Problem List Decreased strength;Decreased range of motion;Decreased activity tolerance;Decreased balance;Decreased mobility;Decreased coordination;Decreased knowledge of use of DME;Decreased safety awareness;Pain       PT Treatment Interventions DME instruction;Gait training;Functional mobility training;Therapeutic exercise;Therapeutic activities;Balance training;Neuromuscular re-education;Patient/family education    PT Goals (Current goals can be found in the Care Plan section)  Acute Rehab PT Goals Patient Stated Goal: get flank pain down PT Goal Formulation: With patient/family Time For Goal Achievement: 05/18/19 Potential to Achieve Goals: Fair    Frequency Min 2X/week   Barriers to discharge        Co-evaluation               AM-PAC PT "6 Clicks" Mobility  Outcome Measure Help needed turning from your back to your side while in a flat bed without using bedrails?: A Lot Help needed moving from lying on your back to sitting on the side of a flat bed without using bedrails?: A Lot Help needed moving to and from a bed to a chair (including a wheelchair)?: A Lot Help needed standing up from a chair using your arms (e.g., wheelchair or bedside chair)?: A Lot Help needed to walk in hospital room?: A Lot Help needed climbing 3-5 steps with a railing? : Total 6 Click Score: 11    End of Session Equipment Utilized During Treatment: Gait belt Activity Tolerance: Patient limited by pain;Patient limited by fatigue Patient left: in bed;with call bell/phone within reach;with nursing/sitter in room;with family/visitor present Nurse Communication: Mobility status PT Visit Diagnosis: Muscle weakness (generalized) (M62.81);Difficulty in walking, not elsewhere classified (R26.2);Unsteadiness on feet (R26.81)    Time: [id number] PT Time Calculation (min) (ACUTE ONLY): 31 min   Charges:   PT Evaluation $PT Eval Low Complexity: 1 Low PT Treatments $Therapeutic Activity: 8-22 mins         Kreg Shropshire, DPT 05/04/2019, 5:02 PM

## 2019-05-04 NOTE — Progress Notes (Signed)
Patient ID: Andrea Schaefer, female   DOB: 1924-04-21, 86 y.o.   MRN: [id number]  Chief Complaint  Patient presents with  . Shortness of Breath    Referred By Dr. Marquis Lunch Reason for Referral management of left chest tube  HPI Andrea Schaefer is a 86 y.o. female.  She is currently sitting in the emergency department where she is inspiring nasal cannula oxygen.  She appears very comfortable.  I attempted to discuss with her her medical condition but she appeared somewhat confused and I am entirely unclear as to exactly her history.  However her son states that she has been short of breath for about a week.  When she came to the emergency department she had a very large left-sided pleural effusion and a chest tube was inserted.  The chest tube is well-positioned and drained about 1200 cc from the chest.  There is no air leak present.  There is some serosanguineous fluid in the tube.  Overall she states that she feels better at this time.  Interval events: The patient was admitted to the stepdown unit.  Today, she is on less supplemental oxygen.  There has been minimal output from her chest tube.  She is poorly responsive to questions and requires significant encouragement from her nurse to achieve a weak cough.  With this limited effort, there does not appear to be any air leak.   Past Medical History:  Diagnosis Date  . Anxiety    unspecified  . Arthritis   . Cancer [facility]) 2011   Right  . Cataract cortical, senile   . Depression   . Gout   . History of breast cancer   . History of poliomyelitis    as a child  . History of rosacea   . Hyperlipidemia, unspecified   . Hypertension   . Hypothyroidism    unspecified  . Osteoporosis, post-menopausal   . Pericarditis   . Personal history of radiation therapy   . Renal insufficiency   . Seborrheic dermatitis   . Thyroid disease     Past Surgical History:  Procedure Laterality Date  . BACK SURGERY    . BASAL CELL CARCINOMA EXCISION  Left    BCC left cheek  . BREAST BIOPSY Right 2011   +  . CARDIAC CATHETERIZATION  03/15/2005  . CATARACT EXTRACTION Left 2008  . FRACTURE SURGERY Left 05/17/2005   ORIF left distal radius fracture  . FRACTURE SURGERY Left 05/2005   left wrist  . FRACTURE SURGERY  04/2004   right wrist  . MASTECTOMY PARTIAL / LUMPECTOMY Right 2011   right breast    Family History  Problem Relation Age of Onset  . Hypertension Other   . Alzheimer's disease Mother   . Coronary artery disease Mother   . Hypertension Mother   . Lung cancer Father   . Alzheimer's disease Brother   . Breast cancer Neg Hx     Social History Social History   Tobacco Use  . Smoking status: Never Smoker  . Smokeless tobacco: Never Used  Substance Use Topics  . Alcohol use: No  . Drug use: No    Allergies  Allergen Reactions  . Codeine Nausea And Vomiting  . Tetracyclines & Related Nausea And Vomiting  . Adhesive [Tape] Rash  . Keflex [Cephalexin] Rash  . Naproxen Rash    Patient was on both Keflex and Naproxen when developed rash -  unclear as to which caused rash    Current  Facility-Administered Medications  Medication Dose Route Frequency Provider Last Rate Last Admin  . atorvastatin (LIPITOR) tablet 20 mg  20 mg Oral Daily Athena Masse, MD   20 mg at 05/04/19 0906  . azithromycin (ZITHROMAX) tablet 250 mg  250 mg Oral Daily Loletha Grayer, MD   250 mg at 05/04/19 [id number]  . buPROPion (WELLBUTRIN XL) 24 hr tablet 150 mg  150 mg Oral Daily Athena Masse, MD   150 mg at 05/04/19 0906  . chlorhexidine (PERIDEX) 0.12 % solution 15 mL  15 mL Mouth Rinse BID Loletha Grayer, MD   15 mL at 05/04/19 0906  . Chlorhexidine Gluconate Cloth 2 % PADS 6 each  6 each Topical Daily Loletha Grayer, MD   6 each at 05/04/19 1103  . enoxaparin (LOVENOX) injection 40 mg  40 mg Subcutaneous Q24H Athena Masse, MD   40 mg at 05/03/19 2119  . escitalopram (LEXAPRO) tablet 20 mg  20 mg Oral Daily Judd Gaudier V, MD    20 mg at 05/04/19 0906  . furosemide (LASIX) injection 40 mg  40 mg Intravenous BID Loletha Grayer, MD   40 mg at 05/04/19 1806  . ipratropium-albuterol (DUONEB) 0.5-2.5 (3) MG/3ML nebulizer solution 3 mL  3 mL Nebulization Q6H PRN Awilda Bill, NP      . levothyroxine (SYNTHROID) tablet 125 mcg  125 mcg Oral QAC breakfast Athena Masse, MD   125 mcg at 05/04/19 0537  . MEDLINE mouth rinse  15 mL Mouth Rinse q12n4p Loletha Grayer, MD   15 mL at 05/04/19 1732  . meropenem (MERREM) 1 g in sodium chloride 0.9 % 100 mL IVPB  1 g Intravenous Q12H Rowland Lathe, RPH   Stopped at 05/04/19 [id number]  . metoprolol tartrate (LOPRESSOR) tablet 50 mg  50 mg Oral BID Athena Masse, MD   50 mg at 05/04/19 0907  . morphine 2 MG/ML injection 2 mg  2 mg Intravenous Q4H PRN Lang Snow, NP   2 mg at 05/04/19 1112  . oxyCODONE (Oxy IR/ROXICODONE) immediate release tablet 5 mg  5 mg Oral Q6H PRN Lang Snow, NP   5 mg at 05/04/19 1735  . potassium chloride (KLOR-CON) packet 20 mEq  20 mEq Oral BID Loletha Grayer, MD      . QUEtiapine (SEROQUEL) tablet 25 mg  25 mg Oral QHS Wieting, Richard, MD          Review of Systems Unable to obtain, secondary to patient mental status  Blood pressure (!) 111/54, pulse 70, temperature 98.5 F (36.9 C), temperature source Oral, resp. rate 13, height 5\' 5"  (1.651 m), weight 90.7 kg, SpO2 96 %.  Physical Exam General: She is alert but is unable to answer normal orientation questions. Cardiovascular: Regular rate and rhythm Pulmonary: No respiratory distress, normal work of breathing.  Chest tube in place with very small amount of serosanguineous drainage.  No air leak     Assessment    Left pleural effusion status post chest tube insertion with near complete drainage    Plan    At the present time I would recommend we leave her chest tube to suction at 20 cm of water. We will continue her chest tube to suction over the weekend and  make further assessments next week.    [name], MD 05/04/2019, 8:55 PM   Patient ID: Andrea Schaefer, female   DOB: 08-30-1924, 86 y.o.   MRN: [id number]

## 2019-05-05 ENCOUNTER — Inpatient Hospital Stay: Payer: Medicare Other

## 2019-05-05 DIAGNOSIS — M25552 Pain in left hip: Secondary | ICD-10-CM

## 2019-05-05 LAB — CBC WITH DIFFERENTIAL/PLATELET
Abs Immature Granulocytes: 0.02 10*3/uL (ref 0.00–0.07)
Basophils Absolute: 0 10*3/uL (ref 0.0–0.1)
Basophils Relative: 0 %
Eosinophils Absolute: 0.2 10*3/uL (ref 0.0–0.5)
Eosinophils Relative: 3 %
HCT: 38.4 % (ref 36.0–46.0)
Hemoglobin: 11.9 g/dL — ABNORMAL LOW (ref 12.0–15.0)
Immature Granulocytes: 0 %
Lymphocytes Relative: 18 %
Lymphs Abs: 1.4 10*3/uL (ref 0.7–4.0)
MCH: 29.2 pg (ref 26.0–34.0)
MCHC: 31 g/dL (ref 30.0–36.0)
MCV: 94.1 fL (ref 80.0–100.0)
Monocytes Absolute: 1.2 10*3/uL — ABNORMAL HIGH (ref 0.1–1.0)
Monocytes Relative: 15 %
Neutro Abs: 4.9 10*3/uL (ref 1.7–7.7)
Neutrophils Relative %: 64 %
Platelets: 209 10*3/uL (ref 150–400)
RBC: 4.08 MIL/uL (ref 3.87–5.11)
RDW: 16.5 % — ABNORMAL HIGH (ref 11.5–15.5)
WBC: 7.8 10*3/uL (ref 4.0–10.5)
nRBC: 0 % (ref 0.0–0.2)

## 2019-05-05 LAB — BASIC METABOLIC PANEL
Anion gap: 8 (ref 5–15)
BUN: 19 mg/dL (ref 8–23)
CO2: 37 mmol/L — ABNORMAL HIGH (ref 22–32)
Calcium: 8.3 mg/dL — ABNORMAL LOW (ref 8.9–10.3)
Chloride: 96 mmol/L — ABNORMAL LOW (ref 98–111)
Creatinine, Ser: 1.12 mg/dL — ABNORMAL HIGH (ref 0.44–1.00)
GFR calc Af Amer: 49 mL/min — ABNORMAL LOW (ref >60)
GFR calc non Af Amer: 43 mL/min — ABNORMAL LOW (ref >60)
Glucose, Bld: 102 mg/dL — ABNORMAL HIGH (ref 70–99)
Potassium: 3.5 mmol/L (ref 3.5–5.1)
Sodium: 141 mmol/L (ref 135–145)

## 2019-05-05 LAB — PROTEIN, BODY FLUID (OTHER): Total Protein, Body Fluid Other: 2.3 g/dL

## 2019-05-05 LAB — PHOSPHORUS: Phosphorus: 3 mg/dL (ref 2.5–4.6)

## 2019-05-05 LAB — MAGNESIUM: Magnesium: 2.1 mg/dL (ref 1.7–2.4)

## 2019-05-05 MED ORDER — FUROSEMIDE 10 MG/ML IJ SOLN
40.0000 mg | Freq: Every day | INTRAMUSCULAR | Status: DC
  Administered 2019-05-06: 40 mg via INTRAVENOUS
  Filled 2019-05-05: qty 4

## 2019-05-05 MED ORDER — MORPHINE SULFATE (PF) 2 MG/ML IV SOLN: 1.0000 mg | INTRAVENOUS | Status: DC | PRN

## 2019-05-05 MED ORDER — TRAMADOL HCL 50 MG PO TABS
50.0000 mg | ORAL_TABLET | Freq: Two times a day (BID) | ORAL | Status: DC | PRN
  Administered 2019-05-05: 50 mg via ORAL
  Filled 2019-05-05: qty 1

## 2019-05-05 NOTE — Progress Notes (Signed)
[name] MRN: [id number] DOB: [birth year]    ADMISSION DATE:  05/02/2019 CONSULTATION DATE: 05/03/2019  REFERRING MD : Dr. Leslye Peer   CHIEF COMPLAINT: Shortness of Breath   BRIEF PATIENT DESCRIPTION:  86 yo female admitted with acute on chronic hypoxic respiratory failure secondary to large left sided pleural effusion requiring left sided chest tube and acute on chronic diastolic CHF   SIGNIFICANT EVENTS/STUDIES:  03/19: Pt admitted to the stepdown unit with left sided chest tube   3/21 - patient is clinically improved with less supplemental oxygen.  There is minimal output through chest tube.   3/22- patient remains stable, no output from chest tube.  CT surgery for chest tube managemnt.  Will sign off.   HISTORY OF PRESENT ILLNESS:   This is a 86 yo female with a PMH of Hypothyroidism, Seborrheic Dermatitis, Pericarditis, Osteoporosis, HTN, Hyperlipidemia, Rosacea, Poliomyelitis, Right Breat Cancer s/p radiation, Depression, Gout, Cortical Cataract, Anxiety, and Arthritis.  She presented to [facility] ER via EMS on 03/18 from home after her son noticed she was having worsening shortness of breath, onset of symptoms 1 week prior to presentation.  At baseline she ambulates with a walker.  EMS reported when they arrived at pts home she was hypoxic with O2 sats 86% on RA requiring 15L NRB.  Upon arrival to the ER pt noted to have labored respirations with O2 sats 96% on NRB.  CXR revealed a large left pleural effusion with compressive atelectasis requiring left sided chest tube placement with return of 1.2L of serosanguinous pleural fluid.  She was subsequently admitted to the stepdown unit per hospitalist team for additional workup and treatment.  PCCM team and Cardiothoracic Surgery consulted to assist with management.    PAST MEDICAL HISTORY :   has a past medical history of Anxiety, Arthritis, Cancer ([place]) (2011), Cataract cortical, senile, Depression, Gout, History of breast cancer,  History of poliomyelitis, History of rosacea, Hyperlipidemia, unspecified, Hypertension, Hypothyroidism, Osteoporosis, post-menopausal, Pericarditis, Personal history of radiation therapy, Renal insufficiency, Seborrheic dermatitis, and Thyroid disease.  has a past surgical history that includes Back surgery; Breast biopsy (Right, 2011); Cataract extraction (Left, 2008); Cardiac catheterization (03/15/2005); Fracture surgery (Left, 05/17/2005); Fracture surgery (Left, 05/2005); Fracture surgery (04/2004); Excision basal cell carcinoma (Left); and Mastectomy partial / lumpectomy (Right, 2011). Prior to Admission medications   Medication Sig Start Date End Date Taking? Authorizing Provider  acetaminophen (TYLENOL) 325 MG tablet Take 2 tablets (650 mg total) by mouth every 6 (six) hours as needed for mild pain, moderate pain or headache (headache). 09/11/15  Yes Gladstone Lighter, MD  amLODipine (NORVASC) 10 MG tablet Take 1 tablet (10 mg total) by mouth daily. 09/18/14  Yes Demetrios Loll, MD  atorvastatin (LIPITOR) 20 MG tablet Take 20 mg by mouth daily.   Yes [provider]  buPROPion (WELLBUTRIN XL) 150 MG 24 hr tablet Take 150 mg by mouth daily.   Yes [provider]  cholecalciferol (VITAMIN D) 1000 units tablet Take 2,000 Units by mouth daily.   Yes [provider]  escitalopram (LEXAPRO) 20 MG tablet Take 20 mg by mouth daily.    Yes [provider]  hydrALAZINE (APRESOLINE) 25 MG tablet Take 1 tablet (25 mg total) by mouth 3 (three) times daily. 09/18/14  Yes Demetrios Loll, MD  levothyroxine (SYNTHROID) 125 MCG tablet Take 125 mcg by mouth daily before breakfast.    Yes [provider]  metoprolol (LOPRESSOR) 50 MG tablet Take 1 tablet (50 mg total) by  mouth 2 (two) times daily. 09/18/14  Yes Demetrios Loll, MD  polyethylene glycol [facility] Floria Raveling) packet Take 17 g by mouth daily as needed for moderate constipation or severe constipation. 02/10/17  Yes Wieting,  Richard, MD  potassium chloride (MICRO-K) 10 MEQ CR capsule Take 10 mEq by mouth daily.   Yes [provider]   Allergies  Allergen Reactions  . Codeine Nausea And Vomiting  . Tetracyclines & Related Nausea And Vomiting  . Adhesive [Tape] Rash  . Keflex [Cephalexin] Rash  . Naproxen Rash    Patient was on both Keflex and Naproxen when developed rash -  unclear as to which caused rash    FAMILY HISTORY:  family history includes Alzheimer's disease in her brother and mother; Coronary artery disease in her mother; Hypertension in her mother and another family member; Lung cancer in her father. SOCIAL HISTORY:  reports that she has never smoked. She has never used smokeless tobacco. She reports that she does not drink alcohol or use drugs.  REVIEW OF SYSTEMS:  Positives in BOLD  Gen: Denies fever, chills, weight change, fatigue, night sweats HEENT: Denies blurred vision, double vision, hearing loss, tinnitus, sinus congestion, rhinorrhea, sore throat, neck stiffness, dysphagia PULM: shortness of breath, cough, sputum production, hemoptysis, wheezing CV: left sided chest pain at chest tube site, edema, orthopnea, paroxysmal nocturnal dyspnea, palpitations GI: Denies abdominal pain, nausea, vomiting, diarrhea, hematochezia, melena, constipation, change in bowel habits GU: Denies dysuria, hematuria, polyuria, oliguria, urethral discharge Endocrine: Denies hot or cold intolerance, polyuria, polyphagia or appetite change Derm: Denies rash, dry skin, scaling or peeling skin change Heme: Denies easy bruising, bleeding, bleeding gums Neuro: Denies headache, numbness, weakness, slurred speech, loss of memory or consciousness  SUBJECTIVE:  No complaints at this time   VITAL SIGNS: Temp:  [97.8 F (36.6 C)-98.6 F (37 C)] 98.6 F (37 C) (03/21 1200) Pulse Rate:  [70-91] 85 (03/21 1300) Resp:  [12-34] 28 (03/21 1300) BP: (82-141)/(44-99) 102/50 (03/21 1300) SpO2:  [89 %-97 %] 93 %  (03/21 1300)  PHYSICAL EXAMINATION: General: acutely ill appearing elderly female, resting in bed NAD  Neuro: alert and disoriented to situation at times, follows commands, PERRLA HEENT: supple, no JVD Cardiovascular: nsr, rrr, no R/G, left sided chest tube with serosanguinous drainage   Lungs: crackles throughout, diminished on the left, even, non labored  Abdomen: +BS x4, obese, soft, non tender, non distended  Musculoskeletal: 1+ bilateral lower extremity edema, moves all extremities  Skin: left sided chest tube incision site dressing clean/dry/intact   Recent Labs  Lab 05/03/19 0538 05/04/19 0352 05/05/19 0426  NA 142 142 141  K 3.7 3.1* 3.5  CL 105 98 96*  CO2 29 34* 37*  BUN 15 15 19   CREATININE 0.97 0.97 1.12*  GLUCOSE 91 88 102*   Recent Labs  Lab 05/03/19 0538 05/04/19 0352 05/05/19 0426  HGB 12.2 12.0 11.9*  HCT 40.6 39.4 38.4  WBC 12.3* 8.7 7.8  PLT 241 237 209   DG Chest Port 1 View  Result Date: 05/04/2019 CLINICAL DATA:  Acute respiratory failure EXAM: PORTABLE CHEST 1 VIEW COMPARISON:  May 02, 2019 FINDINGS: A left-sided chest tube is identified, stable. Stable cardiomegaly. The hila and mediastinum are unchanged. No pneumothorax. Increasing opacity in the right base with lower lung volumes on the right. Mild interstitial central opacities in the lungs without overt edema. Opacity in left retrocardiac region with obscuration left hemidiaphragm has increased. No other acute interval changes. IMPRESSION: 1. Increasing  opacity in the right base could represent atelectasis or infiltrate. 2. Increasing opacity in left base is favored represent atelectasis. 3. Suggested pulmonary venous congestion versus minimal edema. Electronically Signed   By: Dorise Bullion III M.D   On: 05/04/2019 08:17   DG HIP UNILAT WITH PELVIS 2-3 VIEWS LEFT  Result Date: 05/05/2019 CLINICAL DATA:  Left hip pain. EXAM: DG HIP (WITH OR WITHOUT PELVIS) 2-3V LEFT COMPARISON:  February 06, 2017. FINDINGS: There is no evidence of hip fracture or dislocation. There is no evidence of arthropathy or other focal bone abnormality. IMPRESSION: Negative. Electronically Signed   By: Marijo Conception M.D.   On: 05/05/2019 12:42    ASSESSMENT / PLAN: Acute on chronic hypoxic respiratory failure secondary to left sided pleural effusion and acute on chronic diastolic CHF  Supplemental O2 for dyspnea and/or hypoxia  Prn bronchodilator therapy  Per cardiothoracic surgery recommendations-chest tube to remain in place with suction at 20 cm of water for now Continue IV lasix Trend WBC and monitor fever curve Continue meropenem  Follow cultures  Aggressive pulmonary hygiene  Trend BMP  Replace electrolytes as indicated  Monitor UOP Avoid nephrotoxic medications   Best Practice: VTE px: subq lovenox SUP px: not indicated  Diet: Heart Healthy     Ottie Glazier, M.D.  Pulmonary & [place]

## 2019-05-05 NOTE — Progress Notes (Signed)
Patient ID: Andrea Schaefer, female   DOB: 1924/06/08, 86 y.o.   MRN: [id number] [facility] PROGRESS NOTE  Andrea Schaefer [id number] DOB: March 11, 1924 DOA: 05/02/2019 PCP: [name], MD  HPI/Subjective: As per nursing staff slept well last night with the Seroquel.  Patient complains of left hip pain.  Feels her breathing is a little bit better.  Objective: Vitals:   05/05/19 1000 05/05/19 1100  BP: (!) 141/65 128/64  Pulse: 88 85  Resp: 19 (!) 22  Temp:    SpO2: 93% 97%    Intake/Output Summary (Last 24 hours) at 05/05/2019 1219 Last data filed at 05/05/2019 1100 Gross per 24 hour  Intake 821.52 ml  Output 1950 ml  Net -1128.48 ml   Filed Weights   05/02/19 1625  Weight: 90.7 kg    ROS: Review of Systems  Constitutional: Negative for fever.  Eyes: Negative for blurred vision.  Respiratory: Positive for shortness of breath.   Cardiovascular: Negative for chest pain.  Gastrointestinal: Negative for abdominal pain.  Genitourinary: Negative for dysuria.  Musculoskeletal: Positive for joint pain.  Neurological: Negative for headaches.   Exam: Physical Exam  HENT:  Nose: No mucosal edema.  Mouth/Throat: No oropharyngeal exudate or posterior oropharyngeal edema.  Eyes: Conjunctivae and lids are normal.  Cardiovascular: S1 normal and S2 normal. Exam reveals no gallop.  No murmur heard. Respiratory: No respiratory distress. She has decreased breath sounds in the right lower field and the left lower field. She has no wheezes. She has rhonchi in the right lower field and the left lower field. She has no rales.  GI: Soft. Bowel sounds are normal. There is no abdominal tenderness.  Musculoskeletal:     Right ankle: Swelling present.     Left ankle: Swelling present.     Comments: Patient complains of pain when I flex her hip and press over her hip.  Lymphadenopathy:    She has no cervical adenopathy.  Neurological: She is alert.  Skin: Skin is warm. Nails show no  clubbing.  Bruising right arm, left hand  Psychiatric: She has a normal mood and affect.      Data Reviewed: Basic Metabolic Panel: Recent Labs  Lab 05/02/19 1634 05/02/19 2246 05/03/19 0538 05/04/19 0352 05/05/19 0426  NA 140  --  142 142 141  K 3.7  --  3.7 3.1* 3.5  CL 103  --  105 98 96*  CO2 29  --  29 34* 37*  GLUCOSE 120*  --  91 88 102*  BUN 15  --  15 15 19   CREATININE 1.07* 1.05* 0.97 0.97 1.12*  CALCIUM 9.2  --  8.8* 8.5* 8.3*  MG  --   --   --  1.6* 2.1  PHOS  --   --   --  3.2 3.0   Liver Function Tests: Recent Labs  Lab 05/02/19 1634  AST 16  ALT 12  ALKPHOS 79  BILITOT 0.9  PROT 7.5  ALBUMIN 3.7   CBC: Recent Labs  Lab 05/02/19 1634 05/02/19 2246 05/03/19 0538 05/04/19 0352 05/05/19 0426  WBC 8.7 9.8 12.3* 8.7 7.8  NEUTROABS 6.8  --   --  5.9 4.9  HGB 13.1 12.0 12.2 12.0 11.9*  HCT 43.3 40.1 40.6 39.4 38.4  MCV 95.6 95.7 96.0 93.6 94.1  PLT 267 247 241 237 209   BNP (last 3 results) Recent Labs    05/02/19 1634  BNP 469.0*     Recent Results (from the  past 240 hour(s))  Blood culture (single)     Status: None (Preliminary result)   Collection Time: 05/02/19  4:35 PM   Specimen: BLOOD  Result Value Ref Range Status   Specimen Description BLOOD RIGHT ANTECUBITAL  Final   Special Requests   Final    BOTTLES DRAWN AEROBIC AND ANAEROBIC Blood Culture adequate volume   Culture   Final    NO GROWTH 3 DAYS Performed at [facility], [address] [place], [place] [address]    Report Status PENDING  Incomplete  Blood culture (single)     Status: None (Preliminary result)   Collection Time: 05/02/19  4:54 PM   Specimen: BLOOD  Result Value Ref Range Status   Specimen Description BLOOD BLOOD LEFT HAND  Final   Special Requests   Final    BOTTLES DRAWN AEROBIC AND ANAEROBIC Blood Culture results may not be optimal due to an inadequate volume of blood received in culture bottles   Culture   Final    NO GROWTH 3  DAYS Performed at [facility], [address] [place], [place] [address]    Report Status PENDING  Incomplete  Respiratory Panel by RT PCR (Flu A&B, Covid) - Nasopharyngeal Swab     Status: None   Collection Time: 05/02/19  5:05 PM   Specimen: Nasopharyngeal Swab  Result Value Ref Range Status   SARS Coronavirus 2 by RT PCR NEGATIVE NEGATIVE Final    Comment: (NOTE) SARS-CoV-2 target nucleic acids are NOT DETECTED. The SARS-CoV-2 RNA is generally detectable in upper respiratoy specimens during the acute phase of infection. The lowest concentration of SARS-CoV-2 viral copies this assay can detect is 131 copies/mL. A negative result does not preclude SARS-Cov-2 infection and should not be used as the sole basis for treatment or other patient management decisions. A negative result may occur with  improper specimen collection/handling, submission of specimen other than nasopharyngeal swab, presence of viral mutation(s) within the areas targeted by this assay, and inadequate number of viral copies (<131 copies/mL). A negative result must be combined with clinical observations, patient history, and epidemiological information. The expected result is Negative. Fact Sheet for Patients:  PinkCheek.be Fact Sheet for Healthcare Providers:  GravelBags.it This test is not yet ap proved or cleared by the [place] FDA and  has been authorized for detection and/or diagnosis of SARS-CoV-2 by FDA under an Emergency Use Authorization (EUA). This EUA will remain  in effect (meaning this test can be used) for the duration of the COVID-19 declaration under Section 564(b)(1) of the Act, 21 U.S.C. section 360bbb-3(b)(1), unless the authorization is terminated or revoked sooner.    Influenza A by PCR NEGATIVE NEGATIVE Final   Influenza B by PCR NEGATIVE NEGATIVE Final    Comment: (NOTE) The Xpert Xpress SARS-CoV-2/FLU/RSV assay  is intended as an aid in  the diagnosis of influenza from Nasopharyngeal swab specimens and  should not be used as a sole basis for treatment. Nasal washings and  aspirates are unacceptable for Xpert Xpress SARS-CoV-2/FLU/RSV  testing. Fact Sheet for Patients: PinkCheek.be Fact Sheet for Healthcare Providers: GravelBags.it This test is not yet approved or cleared by the [place] FDA and  has been authorized for detection and/or diagnosis of SARS-CoV-2 by  FDA under an Emergency Use Authorization (EUA). This EUA will remain  in effect (meaning this test can be used) for the duration of the  Covid-19 declaration under Section 564(b)(1) of the Act, 21  U.S.C. section 360bbb-3(b)(1),  unless the authorization is  terminated or revoked. Performed at [facility], [address] [place], [place] [address]   Body fluid culture (includes gram stain)     Status: None (Preliminary result)   Collection Time: 05/02/19  8:31 PM   Specimen: Pleural Fluid  Result Value Ref Range Status   Specimen Description   Final    PLEURAL Performed at [facility], [address], [place], [place] [address]    Special Requests   Final    NONE Performed at [facility], [address] [place], [place] [address]    Gram Stain   Final    FEW WBC PRESENT, PREDOMINANTLY MONONUCLEAR NO ORGANISMS SEEN    Culture   Final    NO GROWTH 3 DAYS Performed at [facility], [place] [address], [place], [place] [address]    Report Status PENDING  Incomplete  Urine culture     Status: None   Collection Time: 05/03/19  4:15 AM   Specimen: In/Out Cath Urine  Result Value Ref Range Status   Specimen Description   Final    IN/OUT CATH URINE Performed at [facility], [address], [place], [place] [address]    Special Requests   Final    NONE Performed at [facility], [address],  [place], [place] [address]    Culture   Final    NO GROWTH Performed at [facility], [place] [address], [place], [place] [address]    Report Status 05/04/2019 FINAL  Final  MRSA PCR Screening     Status: None   Collection Time: 05/03/19  8:08 PM   Specimen: Nasal Mucosa; Nasopharyngeal  Result Value Ref Range Status   MRSA by PCR NEGATIVE NEGATIVE Final    Comment:        The GeneXpert MRSA Assay (FDA approved for NASAL specimens only), is one component of a comprehensive MRSA colonization surveillance program. It is not intended to diagnose MRSA infection nor to guide or monitor treatment for MRSA infections. Performed at [facility], [address], [place], [place] [address]      Studies: [facility] Chest Resurgens [facility] 1 View  Result Date: 05/04/2019 CLINICAL DATA:  Acute respiratory failure EXAM: PORTABLE CHEST 1 VIEW COMPARISON:  May 02, 2019 FINDINGS: A left-sided chest tube is identified, stable. Stable cardiomegaly. The hila and mediastinum are unchanged. No pneumothorax. Increasing opacity in the right base with lower lung volumes on the right. Mild interstitial central opacities in the lungs without overt edema. Opacity in left retrocardiac region with obscuration left hemidiaphragm has increased. No other acute interval changes. IMPRESSION: 1. Increasing opacity in the right base could represent atelectasis or infiltrate. 2. Increasing opacity in left base is favored represent atelectasis. 3. Suggested pulmonary venous congestion versus minimal edema. Electronically Signed   By: Dorise Bullion III M.D   On: 05/04/2019 08:17   ECHOCARDIOGRAM COMPLETE  Result Date: 05/03/2019    ECHOCARDIOGRAM REPORT   Patient Name:   ANGIELINA RANCE Date of Exam: 05/03/2019 Medical Rec #:  [id number]   Height:       65.0 in Accession #:    [id number]  Weight:       200.0 lb Date of Birth:  Dec 21, 1924   BSA:          1.978 m Patient Age:    30 years    BP:           120/59  mmHg Patient Gender: F            HR:           67 bpm. Exam Location:  [facility] Procedure: 2D Echo, Cardiac Doppler and Color Doppler Indications:     CHF-acute diastolic [id number]  History:         Patient has no prior history of Echocardiogram examinations.                  Risk Factors:Hypertension.  Sonographer:     Sherrie Sport RDCS (AE) Referring Phys:  [id number] Athena Masse Diagnosing Phys: Nelva Bush MD  Sonographer Comments: Suboptimal apical window. IMPRESSIONS  1. Left ventricular ejection fraction, by estimation, is 60 to 65%. The left ventricle has normal function. The left ventricle has no regional wall motion abnormalities. There is moderate left ventricular hypertrophy. Left ventricular diastolic parameters are consistent with Grade I diastolic dysfunction (impaired relaxation). Elevated left atrial pressure.  2. There is moderate pulmonary hypertension (PASP 40-45 mmHg plus central venous pressure). Right ventricular systolic function is low normal. The right ventricular size is moderately enlarged. mildly increased right ventricular wall thickness.  3. Right atrial size was mildly dilated.  4. Question small posterior pericardial effusion, though internal echos raise possibility of complex fluid or soft tissue in the pericardial space.. The pericardial effusion is posterior to the left ventricle.  5. The mitral valve is rheumatic. Trivial mitral valve regurgitation. No evidence of mitral stenosis.  6. Tricuspid valve regurgitation is mild to moderate.  7. The aortic valve is tricuspid. Aortic valve regurgitation is not visualized. Mild aortic valve sclerosis is present, with no evidence of aortic valve stenosis. FINDINGS  Left Ventricle: Left ventricular ejection fraction, by estimation, is 60 to 65%. The left ventricle has normal function. The left ventricle has no regional wall motion abnormalities. The left ventricular internal cavity size was normal in size. There is  moderate left ventricular hypertrophy. Left ventricular  diastolic parameters are consistent with Grade I diastolic dysfunction (impaired relaxation). Elevated left atrial pressure. Right Ventricle: There is moderate pulmonary hypertension (PASP 40-45 mmHg plus central venous pressure). The right ventricular size is moderately enlarged. Mildly increased right ventricular wall thickness. Right ventricular systolic function is low normal. Left Atrium: Left atrial size was normal in size. Right Atrium: Right atrial size was mildly dilated. Pericardium: Question small posterior pericardial effusion, though internal echos raise possibility of complex fluid or soft tissue in the pericardial space. The pericardium was not well visualized. The pericardial effusion is posterior to the left ventricle. Mitral Valve: The mitral valve is rheumatic. Trivial mitral valve regurgitation. No evidence of mitral valve stenosis. Tricuspid Valve: The tricuspid valve is normal in structure. Tricuspid valve regurgitation is mild to moderate. Aortic Valve: The aortic valve is tricuspid. . There is mild thickening and mild calcification of the aortic valve. Aortic valve regurgitation is not visualized. Mild aortic valve sclerosis is present, with no evidence of aortic valve stenosis. There is mild thickening of the aortic valve. There is mild calcification of the aortic valve. Aortic valve mean gradient measures 5.3 mmHg. Aortic valve peak gradient measures 9.3 mmHg. Aortic valve area, by VTI measures 1.78 cm. Pulmonic Valve: The pulmonic valve was not well visualized. Pulmonic valve regurgitation is mild. No evidence of pulmonic stenosis. Aorta: The aortic root is normal in size and structure. Pulmonary Artery: The pulmonary artery is not well seen. Venous: The inferior vena cava was not well visualized. IAS/Shunts: The interatrial septum was  not well visualized. Additional Comments: There is pleural effusion in the left lateral region.  LEFT VENTRICLE PLAX 2D LVIDd:         3.72 cm  Diastology  LVIDs:         2.06 cm  LV e' lateral:   3.92 cm/s LV PW:         1.23 cm  LV E/e' lateral: 19.1 LV IVS:        1.11 cm  LV e' medial:    3.92 cm/s LVOT diam:     2.00 cm  LV E/e' medial:  19.1 LV SV:         55 LV SV Index:   28 LVOT Area:     3.14 cm  RIGHT VENTRICLE RV Basal diam:  5.01 cm RV S prime:     10.60 cm/s TAPSE (M-mode): 2.1 cm LEFT ATRIUM             Index       RIGHT ATRIUM           Index LA diam:        2.60 cm 1.31 cm/m  RA Area:     21.50 cm LA Vol (A2C):   80.1 ml 40.49 ml/m RA Volume:   61.40 ml  31.04 ml/m LA Vol (A4C):   27.9 ml 14.10 ml/m LA Biplane Vol: 48.7 ml 24.62 ml/m  AORTIC VALVE                    PULMONIC VALVE AV Area (Vmax):    1.54 cm     PV Vmax:        0.55 m/s AV Area (Vmean):   1.36 cm     PV Peak grad:   1.2 mmHg AV Area (VTI):     1.78 cm     RVOT Peak grad: 2 mmHg AV Vmax:           152.67 cm/s AV Vmean:          106.433 cm/s AV VTI:            0.311 m AV Peak Grad:      9.3 mmHg AV Mean Grad:      5.3 mmHg LVOT Vmax:         74.60 cm/s LVOT Vmean:        46.100 cm/s LVOT VTI:          0.176 m LVOT/AV VTI ratio: 0.57  AORTA Ao Root diam: 2.90 cm MITRAL VALVE               TRICUSPID VALVE MV Area (PHT): 2.18 cm    TR Peak grad:   44.4 mmHg MV Decel Time: 348 msec    TR Vmax:        333.00 cm/s MV E velocity: 74.70 cm/s MV A velocity: 85.40 cm/s  SHUNTS MV E/A ratio:  0.87        Systemic VTI:  0.18 m                            Systemic Diam: 2.00 cm Nelva Bush MD Electronically signed by Nelva Bush MD Signature Date/Time: 05/03/2019/3:57:27 PM    Final     Scheduled Meds: . atorvastatin  20 mg Oral Daily  . azithromycin  250 mg Oral Daily  . buPROPion  150 mg Oral Daily  . chlorhexidine  15 mL Mouth Rinse BID  .  Chlorhexidine Gluconate Cloth  6 each Topical Daily  . enoxaparin (LOVENOX) injection  40 mg Subcutaneous Q24H  . escitalopram  20 mg Oral Daily  . [START ON 05/06/2019] furosemide  40 mg Intravenous Daily  . levothyroxine  125 mcg  Oral QAC breakfast  . mouth rinse  15 mL Mouth Rinse q12n4p  . metoprolol tartrate  50 mg Oral BID  . potassium chloride  20 mEq Oral BID  . QUEtiapine  25 mg Oral QHS   Continuous Infusions: . meropenem (MERREM) IV Stopped (05/05/19 0845)    Assessment/Plan:  1. Acute on chronic hypoxic respiratory failure.  Patient currently on 4 L high flow nasal cannula.  Patient can transfer out of the ICU at this point.  Chronically wears 2.5 liters at home chronically 2. Large left pleural effusion status post chest tube placement.  Chest tube to stay in over the weekend as per cardiothoracic surgery.  Case discussed with critical care specialist and this is more secondary to CHF. 3. Acute diastolic CHF.  Change IV Lasix from twice daily dosing to daily dosing.  Patient on metoprolol. 4. Left hip pain we will get an x-ray of the left hip and pelvis 5. Possible lobar pneumonia on meropenem and Zithromax 6. Hypokalemia and hypomagnesemia.  Magnesium already replaced.  Potassium supplement orally 7. Essential hypertension on metoprolol.  Holding amlodipine and hydralazine 8. Hypothyroidism unspecified on levothyroxine 9. Anxiety depression on Lexapro 10. Hyperlipidemia unspecified on atorvastatin 11. Confusion and insomnia.  Seroquel helped with sleeping.  Code Status:     Code Status Orders  (From admission, onward)         Start     Ordered   05/02/19 2116  Full code  Continuous     05/02/19 2131        Code Status History    Date Active Date Inactive Code Status Order ID Comments User Context   02/16/2018 1921 02/18/2018 1826 Full Code [id number]  Hillary Bow, MD ED   02/06/2017 2332 02/11/2017 1448 Full Code [id number]  Max Sane, MD Inpatient   09/08/2015 2101 09/11/2015 1243 Full Code [id number]  Max Sane, MD Inpatient   09/12/2014 2025 09/18/2014 1609 Full Code [id number]  Hower, Aaron Mose, MD ED   Advance Care Planning Activity    Advance Directive Documentation     Most Recent  Value  Type of Advance Directive  Healthcare Power of Attorney  Pre-existing out of facility DNR order (yellow form or pink MOST form)  --  "MOST" Form in Place?  --     Family Communication: Spoke with son on the phone Disposition Plan: Patient has a chest tube plan and they will be no disposition while the chest tube is in.  Cardiothoracic surgery wanted to leave the chest tube in over the weekend and reassess the tube status on Monday.  Patient is also still on high flow nasal cannula.  Patient will be have to be switched over from high flow nasal cannula back to regular nasal cannula and be stable for 24 hours prior to any disposition.  Physical therapy currently recommending rehab.  Consultants:  Critical care specialist  Cardiothoracic surgery  Procedures: -Chest tube placement  Antibiotics:  Meropenem  Zithromax  Time spent: 27 minutes.  Case discussed with critical care specialist  Maury Groninger [facility]  [facility]

## 2019-05-05 NOTE — Plan of Care (Signed)

## 2019-05-05 NOTE — Progress Notes (Signed)
Patient ID: Andrea Schaefer, female   DOB: March 29, 1924, 86 y.o.   MRN: [id number]  Chief Complaint  Patient presents with  . Shortness of Breath    Referred By Dr. Marquis Lunch Reason for Referral management of left chest tube  HPI CAHTERINE Schaefer is a 86 y.o. female.  She is currently sitting in the emergency department where she is inspiring nasal cannula oxygen.  She appears very comfortable.  I attempted to discuss with her her medical condition but she appeared somewhat confused and I am entirely unclear as to exactly her history.  However her son states that she has been short of breath for about a week.  When she came to the emergency department she had a very large left-sided pleural effusion and a chest tube was inserted.  The chest tube is well-positioned and drained about 1200 cc from the chest.  There is no air leak present.  There is some serosanguineous fluid in the tube.  Overall she states that she feels better at this time.  Interval events: There has been essentially no recorded output from her chest tube.  She is quite drowsy today and is unable to cooperate with a cough.  I am unable to determine whether or not there is an air leak.   Past Medical History:  Diagnosis Date  . Anxiety    unspecified  . Arthritis   . Cancer [facility]) 2011   Right  . Cataract cortical, senile   . Depression   . Gout   . History of breast cancer   . History of poliomyelitis    as a child  . History of rosacea   . Hyperlipidemia, unspecified   . Hypertension   . Hypothyroidism    unspecified  . Osteoporosis, post-menopausal   . Pericarditis   . Personal history of radiation therapy   . Renal insufficiency   . Seborrheic dermatitis   . Thyroid disease     Past Surgical History:  Procedure Laterality Date  . BACK SURGERY    . BASAL CELL CARCINOMA EXCISION Left    BCC left cheek  . BREAST BIOPSY Right 2011   +  . CARDIAC CATHETERIZATION  03/15/2005  . CATARACT EXTRACTION Left 2008   . FRACTURE SURGERY Left 05/17/2005   ORIF left distal radius fracture  . FRACTURE SURGERY Left 05/2005   left wrist  . FRACTURE SURGERY  04/2004   right wrist  . MASTECTOMY PARTIAL / LUMPECTOMY Right 2011   right breast    Family History  Problem Relation Age of Onset  . Hypertension Other   . Alzheimer's disease Mother   . Coronary artery disease Mother   . Hypertension Mother   . Lung cancer Father   . Alzheimer's disease Brother   . Breast cancer Neg Hx     Social History Social History   Tobacco Use  . Smoking status: Never Smoker  . Smokeless tobacco: Never Used  Substance Use Topics  . Alcohol use: No  . Drug use: No    Allergies  Allergen Reactions  . Codeine Nausea And Vomiting  . Tetracyclines & Related Nausea And Vomiting  . Adhesive [Tape] Rash  . Keflex [Cephalexin] Rash  . Naproxen Rash    Patient was on both Keflex and Naproxen when developed rash -  unclear as to which caused rash    Current Facility-Administered Medications  Medication Dose Route Frequency Provider Last Rate Last Admin  . atorvastatin (LIPITOR) tablet 20 mg  20 mg Oral Daily Athena Masse, MD   20 mg at 05/05/19 [id number]  . azithromycin (ZITHROMAX) tablet 250 mg  250 mg Oral Daily Loletha Grayer, MD   250 mg at 05/05/19 [id number]  . buPROPion (WELLBUTRIN XL) 24 hr tablet 150 mg  150 mg Oral Daily Judd Gaudier V, MD   150 mg at 05/05/19 1234  . chlorhexidine (PERIDEX) 0.12 % solution 15 mL  15 mL Mouth Rinse BID Loletha Grayer, MD   15 mL at 05/05/19 0821  . Chlorhexidine Gluconate Cloth 2 % PADS 6 each  6 each Topical Daily Loletha Grayer, MD   6 each at 05/04/19 1103  . enoxaparin (LOVENOX) injection 40 mg  40 mg Subcutaneous Q24H Athena Masse, MD   40 mg at 05/04/19 2132  . escitalopram (LEXAPRO) tablet 20 mg  20 mg Oral Daily Athena Masse, MD   20 mg at 05/05/19 [id number]  . [START ON 05/06/2019] furosemide (LASIX) injection 40 mg  40 mg Intravenous Daily Wieting, Richard, MD       . ipratropium-albuterol (DUONEB) 0.5-2.5 (3) MG/3ML nebulizer solution 3 mL  3 mL Nebulization Q6H PRN Awilda Bill, NP      . levothyroxine (SYNTHROID) tablet 125 mcg  125 mcg Oral QAC breakfast Athena Masse, MD   125 mcg at 05/05/19 [id number]  . MEDLINE mouth rinse  15 mL Mouth Rinse q12n4p Loletha Grayer, MD   15 mL at 05/05/19 1223  . meropenem (MERREM) 1 g in sodium chloride 0.9 % 100 mL IVPB  1 g Intravenous Q12H Rowland Lathe, RPH   Stopped at 05/05/19 0845  . metoprolol tartrate (LOPRESSOR) tablet 50 mg  50 mg Oral BID Athena Masse, MD   50 mg at 05/05/19 [id number]  . morphine 2 MG/ML injection 1 mg  1 mg Intravenous Q4H PRN Wieting, Richard, MD      . potassium chloride (KLOR-CON) packet 20 mEq  20 mEq Oral BID Loletha Grayer, MD   20 mEq at 05/05/19 [id number]  . QUEtiapine (SEROQUEL) tablet 25 mg  25 mg Oral QHS Loletha Grayer, MD   25 mg at 05/04/19 2131  . traMADol (ULTRAM) tablet 50 mg  50 mg Oral Q12H PRN Loletha Grayer, MD   50 mg at 05/05/19 1223      Review of Systems Unable to obtain, secondary to patient mental status  Blood pressure (!) 102/50, pulse 85, temperature 98.6 F (37 C), temperature source Oral, resp. rate (!) 28, height 5\' 5"  (1.651 m), weight 90.7 kg, SpO2 93 %.  Physical Exam General: Drowsy and difficult to arouse. Cardiovascular: Regular rate and rhythm Pulmonary: No respiratory distress, normal work of breathing.  Chest tube in place with very small amount of serosanguineous drainage.      Assessment    Left pleural effusion status post chest tube insertion with near complete drainage    Plan    At the present time I would recommend we leave her chest tube to suction at 20 cm of water. We will continue her chest tube to suction over the weekend and make further assessments next week.    [name], MD 05/05/2019, 2:18 PM   Patient ID: Andrea Schaefer, female   DOB: 17-Mar-1924, 86 y.o.   MRN: [id number]

## 2019-05-06 ENCOUNTER — Inpatient Hospital Stay: Payer: Medicare Other

## 2019-05-06 DIAGNOSIS — I5031 Acute diastolic (congestive) heart failure: Secondary | ICD-10-CM

## 2019-05-06 DIAGNOSIS — N179 Acute kidney failure, unspecified: Secondary | ICD-10-CM

## 2019-05-06 DIAGNOSIS — J9621 Acute and chronic respiratory failure with hypoxia: Secondary | ICD-10-CM

## 2019-05-06 DIAGNOSIS — I10 Essential (primary) hypertension: Secondary | ICD-10-CM

## 2019-05-06 DIAGNOSIS — J90 Pleural effusion, not elsewhere classified: Secondary | ICD-10-CM

## 2019-05-06 LAB — PH, BODY FLUID: pH, Body Fluid: 7.5

## 2019-05-06 LAB — CBC WITH DIFFERENTIAL/PLATELET
Abs Immature Granulocytes: 0.03 10*3/uL (ref 0.00–0.07)
Basophils Absolute: 0 10*3/uL (ref 0.0–0.1)
Basophils Relative: 0 %
Eosinophils Absolute: 0.3 10*3/uL (ref 0.0–0.5)
Eosinophils Relative: 3 %
HCT: 40.7 % (ref 36.0–46.0)
Hemoglobin: 12.4 g/dL (ref 12.0–15.0)
Immature Granulocytes: 0 %
Lymphocytes Relative: 16 %
Lymphs Abs: 1.5 10*3/uL (ref 0.7–4.0)
MCH: 28.7 pg (ref 26.0–34.0)
MCHC: 30.5 g/dL (ref 30.0–36.0)
MCV: 94.2 fL (ref 80.0–100.0)
Monocytes Absolute: 1.5 10*3/uL — ABNORMAL HIGH (ref 0.1–1.0)
Monocytes Relative: 16 %
Neutro Abs: 5.9 10*3/uL (ref 1.7–7.7)
Neutrophils Relative %: 65 %
Platelets: 224 10*3/uL (ref 150–400)
RBC: 4.32 MIL/uL (ref 3.87–5.11)
RDW: 16.3 % — ABNORMAL HIGH (ref 11.5–15.5)
WBC: 9.2 10*3/uL (ref 4.0–10.5)
nRBC: 0 % (ref 0.0–0.2)

## 2019-05-06 LAB — BODY FLUID CULTURE: Culture: NO GROWTH

## 2019-05-06 LAB — BASIC METABOLIC PANEL
Anion gap: 9 (ref 5–15)
BUN: 23 mg/dL (ref 8–23)
CO2: 39 mmol/L — ABNORMAL HIGH (ref 22–32)
Calcium: 8.4 mg/dL — ABNORMAL LOW (ref 8.9–10.3)
Chloride: 96 mmol/L — ABNORMAL LOW (ref 98–111)
Creatinine, Ser: 1.24 mg/dL — ABNORMAL HIGH (ref 0.44–1.00)
GFR calc Af Amer: 44 mL/min — ABNORMAL LOW (ref >60)
GFR calc non Af Amer: 38 mL/min — ABNORMAL LOW (ref >60)
Glucose, Bld: 116 mg/dL — ABNORMAL HIGH (ref 70–99)
Potassium: 3.8 mmol/L (ref 3.5–5.1)
Sodium: 144 mmol/L (ref 135–145)

## 2019-05-06 LAB — CYTOLOGY - NON PAP

## 2019-05-06 LAB — PHOSPHORUS: Phosphorus: 2.8 mg/dL (ref 2.5–4.6)

## 2019-05-06 LAB — MAGNESIUM: Magnesium: 2 mg/dL (ref 1.7–2.4)

## 2019-05-06 MED ORDER — SODIUM CHLORIDE 0.9 % IV SOLN
1.0000 g | INTRAVENOUS | Status: AC
  Administered 2019-05-06 – 2019-05-08 (×3): 1 g via INTRAVENOUS
  Filled 2019-05-06: qty 1
  Filled 2019-05-06: qty 10
  Filled 2019-05-06: qty 1

## 2019-05-06 NOTE — Progress Notes (Signed)
  Patient ID: Andrea Schaefer, female   DOB: 1924/12/21, 86 y.o.   MRN: [id number]  HISTORY: She remains confused.  She was unclear if she was in the hospital setting or not.  She only complained of pain at her IV site but not at her chest tube site.   Vitals:   05/06/19 0100 05/06/19 0733  BP:  (!) 124/57  Pulse:  88  Resp:  17  Temp: 99.3 F (37.4 C) 98.6 F (37 C)  SpO2:  95%     EXAM:    Resp: Lungs are clear bilaterally.  No respiratory distress, normal effort. Heart:  Regular without murmurs Abd:  Abdomen is soft, non distended and non tender. No masses are palpable.  There is no rebound and no guarding.  Neurological: Alert and oriented to person, place, and time. Coordination normal.  Skin: Skin is warm and dry. No rash noted. No diaphoretic. No erythema. No pallor.  Psychiatric: Normal mood and affect. Normal behavior. Judgment and thought content normal.   There is no air leak present.  It is unclear how much actually drain from her chest tube as this has not been measured.  ASSESSMENT: I have independently reviewed her chest x-ray from today.  There are small bilateral pleural effusions.  There is no pneumothorax.   PLAN:   We will place her chest tube to waterseal today.  We will repeat her chest x-ray tomorrow.  If her x-ray looks okay we will remove her chest tube    Nestor Lewandowsky, MDPatient ID: Andrea Schaefer, female   DOB: [birth year], 86 y.o.   MRN: [id number]

## 2019-05-06 NOTE — Consult Note (Signed)
Cardiology Consultation:   Patient ID: Andrea Schaefer; [id number]; 10/07/1924   Admit date: 05/02/2019 Date of Consult: 05/06/2019  Primary Care Provider: [name], MD Primary Cardiologist: New to [facility] - consult by Andrea Schaefer   Patient Profile:   Andrea Schaefer is a 86 y.o. female with a hx of chronic hypoxic respiratory failure on supplemental oxygen at 2.5 L via nasal cannula since 2019 following hosptial admission for dyspnea, reported pericarditis, right sided breast cancer s/p partial mastectomy and lumpectomy in 2011, poliomyelitis, HTN, HLD, hypothyroidism, gout and anxiety who is being seen today for the evaluation of dyspnea at the request of Dr. Leslye Schaefer.  History of Present Illness:   Andrea Schaefer underwent cardiac cath in 2007 with details being uncertain. No documentation of cardiology follow up thereafter. She presented to [facility] on 3/18 with a 1-week history of worsening dyspnea and was found to be hypoxic with O2 saturations on 86% on room air upon EMS arrival requiring NRB. Imaging showed a large left pleural effusion with compressive atelectasis. She underwent chest tube placement with 1200 mL of serosanguineous fluid, consistent with exudative etiology. Cytology is pending. Echo showed an EF of 60-65%, no RWMA, Gr1DD, moderate pulmonary hypertension, low normal RVSF with moderately enlarged RV cavity size, mildly dilated right atrium, possible small posterior pericardial effusion, rheumatic mitral valve with trivial regurgitation. She has been diuresed with IV Lasix with a slight bump in renal function currently. Repeat CXR from 3/20 concerning for increasing opacities of the right and left bases favoring atelectasis vs infiltrate with pulmonary venous congestion. BNP 469. HS-Tn negative. Blood cultures negative to date. PCT < 0.10. Covid-19 negative. She denies any fevers, chills, myalgias, cough, or orthopnea. She does report intermittent lower extremity swelling.     Past Medical  History:  Diagnosis Date  . Anxiety    unspecified  . Arthritis   . Cancer [facility]) 2011   Right  . Cataract cortical, senile   . Depression   . Gout   . History of breast cancer   . History of poliomyelitis    as a child  . History of rosacea   . Hyperlipidemia, unspecified   . Hypertension   . Hypothyroidism    unspecified  . Osteoporosis, post-menopausal   . Pericarditis   . Personal history of radiation therapy   . Renal insufficiency   . Seborrheic dermatitis   . Thyroid disease     Past Surgical History:  Procedure Laterality Date  . BACK SURGERY    . BASAL CELL CARCINOMA EXCISION Left    BCC left cheek  . BREAST BIOPSY Right 2011   +  . CARDIAC CATHETERIZATION  03/15/2005  . CATARACT EXTRACTION Left 2008  . FRACTURE SURGERY Left 05/17/2005   ORIF left distal radius fracture  . FRACTURE SURGERY Left 05/2005   left wrist  . FRACTURE SURGERY  04/2004   right wrist  . MASTECTOMY PARTIAL / LUMPECTOMY Right 2011   right breast     Home Meds: Prior to Admission medications   Medication Sig Start Date End Date Taking? Authorizing Provider  acetaminophen (TYLENOL) 325 MG tablet Take 2 tablets (650 mg total) by mouth every 6 (six) hours as needed for mild pain, moderate pain or headache (headache). 09/11/15  Yes Gladstone Lighter, MD  amLODipine (NORVASC) 10 MG tablet Take 1 tablet (10 mg total) by mouth daily. 09/18/14  Yes Demetrios Loll, MD  atorvastatin (LIPITOR) 20 MG tablet Take 20 mg by mouth daily.  Yes [provider]  buPROPion (WELLBUTRIN XL) 150 MG 24 hr tablet Take 150 mg by mouth daily.   Yes [provider]  cholecalciferol (VITAMIN D) 1000 units tablet Take 2,000 Units by mouth daily.   Yes [provider]  escitalopram (LEXAPRO) 20 MG tablet Take 20 mg by mouth daily.    Yes [provider]  hydrALAZINE (APRESOLINE) 25 MG tablet Take 1 tablet (25 mg total) by mouth 3 (three) times daily. 09/18/14  Yes Demetrios Loll, MD   levothyroxine (SYNTHROID) 125 MCG tablet Take 125 mcg by mouth daily before breakfast.    Yes [provider]  metoprolol (LOPRESSOR) 50 MG tablet Take 1 tablet (50 mg total) by mouth 2 (two) times daily. 09/18/14  Yes Demetrios Loll, MD  polyethylene glycol [facility] Floria Raveling) packet Take 17 g by mouth daily as needed for moderate constipation or severe constipation. 02/10/17  Yes Wieting, Richard, MD  potassium chloride (MICRO-K) 10 MEQ CR capsule Take 10 mEq by mouth daily.   Yes [provider]    Inpatient Medications: Scheduled Meds: . atorvastatin  20 mg Oral Daily  . azithromycin  250 mg Oral Daily  . buPROPion  150 mg Oral Daily  . chlorhexidine  15 mL Mouth Rinse BID  . Chlorhexidine Gluconate Cloth  6 each Topical Daily  . enoxaparin (LOVENOX) injection  40 mg Subcutaneous Q24H  . escitalopram  20 mg Oral Daily  . furosemide  40 mg Intravenous Daily  . levothyroxine  125 mcg Oral QAC breakfast  . mouth rinse  15 mL Mouth Rinse q12n4p  . metoprolol tartrate  50 mg Oral BID  . potassium chloride  20 mEq Oral BID  . QUEtiapine  25 mg Oral QHS   Continuous Infusions: . meropenem (MERREM) IV Stopped (05/06/19 0000)   PRN Meds: ipratropium-albuterol, morphine injection, traMADol  Allergies:   Allergies  Allergen Reactions  . Codeine Nausea And Vomiting  . Tetracyclines & Related Nausea And Vomiting  . Adhesive [Tape] Rash  . Keflex [Cephalexin] Rash  . Naproxen Rash    Patient was on both Keflex and Naproxen when developed rash -  unclear as to which caused rash    Social History:   Social History   Socioeconomic History  . Marital status: Widowed    Spouse name: Not on file  . Number of children: 2  . Years of education: 78  . Highest education level: High school graduate  Occupational History  . Not on file  Tobacco Use  . Smoking status: Never Smoker  . Smokeless tobacco: Never Used  Substance and Sexual Activity  . Alcohol use: No  . Drug  use: No  . Sexual activity: Not Currently  Other Topics Concern  . Not on file  Social History Narrative   Full Code   Widowed   Never smoker   No alcohol use   No smokeless tobacco use   2 children   Lives with son   Social Determinants of Health   Financial Resource Strain:   . Difficulty of Paying Living Expenses:   Food Insecurity:   . Worried About Charity fundraiser in the Last Year:   . Arboriculturist in the Last Year:   Transportation Needs:   . Film/video editor (Medical):   Marland Kitchen Lack of Transportation (Non-Medical):   Physical Activity:   . Days of Exercise per Week:   . Minutes of Exercise per Session:   Stress:   .  Feeling of Stress :   Social Connections:   . Frequency of Communication with Friends and Family:   . Frequency of Social Gatherings with Friends and Family:   . Attends Religious Services:   . Active Member of Clubs or Organizations:   . Attends Archivist Meetings:   Marland Kitchen Marital Status:   Intimate Partner Violence:   . Fear of Current or Ex-Partner:   . Emotionally Abused:   Marland Kitchen Physically Abused:   . Sexually Abused:      Family History:   Family History  Problem Relation Age of Onset  . Hypertension Other   . Alzheimer's disease Mother   . Coronary artery disease Mother   . Hypertension Mother   . Lung cancer Father   . Alzheimer's disease Brother   . Breast cancer Neg Hx     ROS:  Review of Systems  Constitutional: Positive for malaise/fatigue. Negative for chills, diaphoresis, fever and weight loss.  HENT: Negative for congestion.   Eyes: Negative for discharge and redness.  Respiratory: Positive for shortness of breath. Negative for cough, sputum production and wheezing.   Cardiovascular: Positive for leg swelling. Negative for chest pain, palpitations, orthopnea, claudication and PND.  Gastrointestinal: Negative for abdominal pain, heartburn, nausea and vomiting.  Musculoskeletal: Negative for falls and myalgias.   Skin: Negative for rash.  Neurological: Positive for weakness. Negative for dizziness, tingling, tremors, sensory change, speech change, focal weakness and loss of consciousness.  Psychiatric/Behavioral: Negative for substance abuse. The patient is not nervous/anxious.   All other systems reviewed and are negative.     Physical Exam/Data:   Vitals:   05/05/19 1658 05/05/19 2303 05/06/19 0100 05/06/19 0733  BP: (!) 120/58 (!) 117/58  (!) 124/57  Pulse: 82 99  88  Resp: 18 16  17   Temp: 98.2 F (36.8 C) (!) 100.6 F (38.1 C) 99.3 F (37.4 C) 98.6 F (37 C)  TempSrc: Oral Oral Oral Oral  SpO2: 98% 90%  95%  Weight:      Height:        Intake/Output Summary (Last 24 hours) at 05/06/2019 0817 Last data filed at 05/05/2019 1100 Gross per 24 hour  Intake 100 ml  Output 500 ml  Net -400 ml   Filed Weights   05/02/19 1625  Weight: 90.7 kg   Body mass index is 33.28 kg/m.   Physical Exam: General: Well developed, well nourished, in no acute distress. Head: Normocephalic, atraumatic, sclera non-icteric, no xanthomas, nares without discharge.  Neck: Negative for carotid bruits. JVD not elevated. Lungs: Diminished and coarse breath sounds bilaterally, particularly along the bilateral bases. Breathing is unlabored. On 3 L via nasal cannula.  Heart: RRR with S1 S2. No murmurs, rubs, or gallops appreciated. Abdomen: Soft, non-tender, non-distended with normoactive bowel sounds. No hepatomegaly. No rebound/guarding. No obvious abdominal masses. Msk:  Strength and tone appear normal for age. Extremities: No clubbing or cyanosis. No edema. Distal pedal pulses are 2+ and equal bilaterally. Neuro: Alert and oriented X 3. No facial asymmetry. No focal deficit. Moves all extremities spontaneously. Psych:  Responds to questions appropriately with a normal affect.   EKG:  The EKG was personally reviewed and demonstrates: NSR, 68 bpm, 1st degree AV block, poor R wave progression along the  precordial leads, baseline wandering making further interpretation difficult Telemetry:  Telemetry was personally reviewed and demonstrates: SR  Weights: [employer]   05/02/19 1625  Weight: 90.7 kg    Relevant CV Studies: 2D echo  05/03/2019: 1. Left ventricular ejection fraction, by estimation, is 60 to 65%. The  left ventricle has normal function. The left ventricle has no regional  wall motion abnormalities. There is moderate left ventricular hypertrophy.  Left ventricular diastolic parameters are consistent with Grade I diastolic dysfunction (impaired relaxation). Elevated left atrial pressure.  2. There is moderate pulmonary hypertension (PASP 40-45 mmHg plus central venous pressure). Right ventricular systolic function is low normal. The  right ventricular size is moderately enlarged. mildly increased right  ventricular wall thickness.  3. Right atrial size was mildly dilated.  4. Question small posterior pericardial effusion, though internal echos  raise possibility of complex fluid or soft tissue in the pericardial  space.. The pericardial effusion is posterior to the left ventricle.  5. The mitral valve is rheumatic. Trivial mitral valve regurgitation. No  evidence of mitral stenosis.  6. Tricuspid valve regurgitation is mild to moderate.  7. The aortic valve is tricuspid. Aortic valve regurgitation is not  visualized. Mild aortic valve sclerosis is present, with no evidence of  aortic valve stenosis.   Laboratory Data:  Chemistry Recent Labs  Lab 05/04/19 0352 05/05/19 0426 05/06/19 0328  NA 142 141 144  K 3.1* 3.5 3.8  CL 98 96* 96*  CO2 34* 37* 39*  GLUCOSE 88 102* 116*  BUN 15 19 23   CREATININE 0.97 1.12* 1.24*  CALCIUM 8.5* 8.3* 8.4*  GFRNONAA 51* 43* 38*  GFRAA 59* 49* 44*  ANIONGAP 10 8 9     Recent Labs  Lab 05/02/19 1634  PROT 7.5  ALBUMIN 3.7  AST 16  ALT 12  ALKPHOS 79  BILITOT 0.9   Hematology Recent Labs  Lab 05/04/19 0352  05/05/19 0426 05/06/19 0328  WBC 8.7 7.8 9.2  RBC 4.21 4.08 4.32  HGB 12.0 11.9* 12.4  HCT 39.4 38.4 40.7  MCV 93.6 94.1 94.2  MCH 28.5 29.2 28.7  MCHC 30.5 31.0 30.5  RDW 16.7* 16.5* 16.3*  PLT 237 209 224   Cardiac EnzymesNo results for input(s): TROPONINI in the last 168 hours. No results for input(s): TROPIPOC in the last 168 hours.  BNP Recent Labs  Lab 05/02/19 1634  BNP 469.0*    DDimer No results for input(s): DDIMER in the last 168 hours.  Radiology/Studies:  [facility] Chest Port 1 View  Result Date: 05/04/2019 IMPRESSION: 1. Increasing opacity in the right base could represent atelectasis or infiltrate. 2. Increasing opacity in left base is favored represent atelectasis. 3. Suggested pulmonary venous congestion versus minimal edema. Electronically Signed   By: Dorise Bullion III M.D   On: 05/04/2019 08:17   DG Chest Portable 1 View  Result Date: 05/02/2019 IMPRESSION: 1. Placement of left-sided chest tube with decreased left pleural effusion and improved aeration of left thorax. Small residual left pleural effusion and partial atelectasis at the left base. 2. Small right pleural effusion with platelike atelectasis at the right base 3. Cardiomegaly with vascular congestion and pulmonary edema Electronically Signed   By: Donavan Foil M.D.   On: 05/02/2019 21:19   DG Chest Portable 1 View  Result Date: 05/02/2019 IMPRESSION: Findings likely due to large left effusion with associated atelectasis. Underlying mass or infection is possible. Patchy hazy opacification over the right lung which may be due to edema or infection. Possible small amount right pleural fluid. Electronically Signed   By: Marin Olp M.D.   On: 05/02/2019 16:37    DG HIP UNILAT WITH PELVIS 2-3 VIEWS LEFT  Result Date: 05/05/2019  IMPRESSION: Negative. Electronically Signed   By: Marijo Conception M.D.   On: 05/05/2019 12:42    Assessment and Plan:   1. Acute on chronic hypoxic respiratory failure:  -Multifactorial including exudative pleural effusion s/p chest tube placement with cytology pending, pulmonary hypertension, HFpEF and possible pulmonary infiltrate with a low grade fever over the past 24 hours as well -She has been on supplemental oxygen since 2019 following admission for dyspnea in which workup was largely unrevealing -Dyspnea improved following chest tube placement and diuresis with subsequent bump in renal function  2. Acute diastolic CHF/pulmonary hypertension: -Symptoms improved following chest tube placement and diuresis  -IV Lasix has been decreased from bid dosing to 40 mg daily, monitor UOP and renal function -She was never a smoker -May need sleep study  -Follow up with pulmonology -If symptoms persist, consider RHC as an outpatient to further gauge hemodynamics and treatment   3. AKI: -Lasix has been decreased this morning as above  4. Large left pleural effusion: -Appears to be exudative and possibly in the setting of PNA -Status post chest tube -Symptoms improving -Await cytology   5. Likely PNA: -ABX per IM  6. HTN: -BP currently well controlled -Continue current therapy per IM  7. HLD: -Lipitor    For questions or updates, please contact [place] Please consult [email or web address] for contact info under Cardiology/STEMI.   Signed, Christell Faith, PA-C [place] Pager: [phone] 05/06/2019, 8:17 AM

## 2019-05-06 NOTE — Consult Note (Signed)
Pharmacy Antibiotic Note  Andrea Schaefer is a 86 y.o. female admitted on 05/02/2019 with Pneumonia.  Pharmacy has been consulted for Meropenem dosing.  Patient is also receiving azithromycin.   Patient S/P Chest tube placement by cardiothoracic surgery.   PCN allergy - low  And documented in 2016. Patient was taking keflex and naproxen at the same time. Per son, he does not remember a severe reaction (SOB, facial swelling, etc.)  and denies a severe rash.   Plan: Day 5 IV abx. Continue Meropenem 1g Q12H for CrCl < 27ml/min   Height: 5\' 5"  (165.1 cm) Weight: 200 lb (90.7 kg) IBW/kg (Calculated) : 57  Temp (24hrs), Avg:99.1 F (37.3 C), Min:98.2 F (36.8 C), Max:100.6 F (38.1 C)  Recent Labs  Lab 05/02/19 1626 05/02/19 1634 05/02/19 2246 05/03/19 0538 05/04/19 0352 05/05/19 0426 05/06/19 0328  WBC  --    < > 9.8 12.3* 8.7 7.8 9.2  CREATININE  --    < > 1.05* 0.97 0.97 1.12* 1.24*  LATICACIDVEN 0.7  --   --   --   --   --   --    < > = values in this interval not displayed.    Estimated Creatinine Clearance: 32.2 mL/min (A) (by C-G formula based on SCr of 1.24 mg/dL (H)).    Allergies  Allergen Reactions  . Codeine Nausea And Vomiting  . Tetracyclines & Related Nausea And Vomiting  . Adhesive [Tape] Rash  . Keflex [Cephalexin] Rash  . Naproxen Rash    Patient was on both Keflex and Naproxen when developed rash -  unclear as to which caused rash    Antimicrobials this admission: 3/18 Meropenem>> 3/19 Azithromycin>>   Thank you for allowing pharmacy to be a part of this patient's care.  Pernell Dupre, PharmD, BCPS Clinical Pharmacist 05/06/2019 9:10 AM

## 2019-05-06 NOTE — Care Management Important Message (Signed)
Important Message  Patient Details  [name] MRN: [id number] Date of Birth: April 15, 1924   Medicare Important Message Given:  Yes     Shelbie Ammons, RN 05/06/2019, 10:47 AM

## 2019-05-06 NOTE — NC FL2 (Signed)
[place] LEVEL OF CARE SCREENING TOOL     IDENTIFICATION  Patient [name] Birthdate: [birth year] Sex: female Admission Date (Current Location): 05/02/2019  [place] and [place] Number:  Engineering geologist and Address:  [facility], [address] [place], [place] [address]      Provider Number: [id number]  Attending Physician Name and Address:  Loletha Grayer, MD  Relative Name and Phone Number:       Current Level of Care: Hospital Recommended Level of Care: [place] Prior Approval Number:    Date Approved/Denied:   PASRR Number: [id number]  Discharge Plan: SNF    Current Diagnoses: Patient Active Problem List   Diagnosis Date Noted  . Left hip pain   . Acute diastolic CHF (congestive heart failure) ([place])   . Confusion   . Acute congestive heart failure ([place])   . Hyperlipidemia   . Pleural effusion on left 05/02/2019  . Acute on chronic respiratory failure with hypoxia ([place]) 05/02/2019  . S/P thoracostomy tube placement ([place]) 05/02/2019  . Essential hypertension 05/02/2019  . Hypothyroidism 05/02/2019  . Anxiety and depression 05/02/2019  . Acute respiratory failure with hypoxia ([place]) 05/02/2019  . UTI (urinary tract infection) 02/16/2018  . Chronic obstructive pulmonary disease with hypoxia ([place]) 03/07/2017  . Peripheral edema 03/06/2017  . Generalized weakness 03/06/2017  . Hypoxia 02/06/2017  . Lobar pneumonia ([place]) 09/08/2015  . Compression fracture of L3 lumbar vertebra 09/12/2014  . Intractable back pain 09/12/2014    Orientation RESPIRATION BLADDER Height & Weight     Self, Place  O2 Incontinent Weight: 200 lb (90.7 kg) Height:  5\' 5"  (165.1 cm)  BEHAVIORAL SYMPTOMS/MOOD NEUROLOGICAL BOWEL NUTRITION STATUS      Continent Diet(see dc summary)  AMBULATORY STATUS COMMUNICATION OF NEEDS Skin   Extensive Assist Verbally Normal                       Personal Care Assistance  Level of Assistance  Bathing, Feeding, Dressing Bathing Assistance: Limited assistance Feeding assistance: Independent Dressing Assistance: Limited assistance     Functional Limitations Info  Sight, Hearing, Speech Sight Info: Adequate Hearing Info: Adequate Speech Info: Adequate    SPECIAL CARE FACTORS FREQUENCY  PT (By licensed PT), OT (By licensed OT)     PT Frequency: 5 x OT Frequency: 3x            Contractures Contractures Info: Not present    Additional Factors Info  Code Status, Allergies, Psychotropic Code Status Info: Full Allergies Info: Codeine, Tetracyclines and related, Adhesive tape, Keflex, Naproxen Psychotropic Info: Wellbutrin, Lexapro         Current Medications (05/06/2019):  This is the current hospital active medication list Current Facility-Administered Medications  Medication Dose Route Frequency Provider Last Rate Last Admin  . atorvastatin (LIPITOR) tablet 20 mg  20 mg Oral Daily Athena Masse, MD   20 mg at 05/06/19 0933  . azithromycin (ZITHROMAX) tablet 250 mg  250 mg Oral Daily Loletha Grayer, MD   250 mg at 05/06/19 0933  . buPROPion (WELLBUTRIN XL) 24 hr tablet 150 mg  150 mg Oral Daily Athena Masse, MD   150 mg at 05/06/19 0933  . cefTRIAXone (ROCEPHIN) 1 g in sodium chloride 0.9 % 100 mL IVPB  1 g Intravenous Q24H Wieting, Richard, MD      . chlorhexidine (PERIDEX) 0.12 % solution 15 mL  15 mL Mouth Rinse BID Wieting,  Richard, MD   15 mL at 05/06/19 0933  . Chlorhexidine Gluconate Cloth 2 % PADS 6 each  6 each Topical Daily Loletha Grayer, MD   6 each at 05/06/19 [id number]  . enoxaparin (LOVENOX) injection 40 mg  40 mg Subcutaneous Q24H Athena Masse, MD   40 mg at 05/05/19 2308  . escitalopram (LEXAPRO) tablet 20 mg  20 mg Oral Daily Judd Gaudier V, MD   20 mg at 05/06/19 0934  . furosemide (LASIX) injection 40 mg  40 mg Intravenous Daily Loletha Grayer, MD   40 mg at 05/06/19 0934  . ipratropium-albuterol (DUONEB) 0.5-2.5 (3)  MG/3ML nebulizer solution 3 mL  3 mL Nebulization Q6H PRN Awilda Bill, NP      . levothyroxine (SYNTHROID) tablet 125 mcg  125 mcg Oral QAC breakfast Athena Masse, MD   125 mcg at 05/06/19 0502  . MEDLINE mouth rinse  15 mL Mouth Rinse q12n4p Loletha Grayer, MD   15 mL at 05/06/19 1529  . metoprolol tartrate (LOPRESSOR) tablet 50 mg  50 mg Oral BID Athena Masse, MD   50 mg at 05/06/19 0933  . morphine 2 MG/ML injection 1 mg  1 mg Intravenous Q4H PRN Wieting, Richard, MD      . potassium chloride (KLOR-CON) packet 20 mEq  20 mEq Oral BID Loletha Grayer, MD   20 mEq at 05/06/19 0933  . QUEtiapine (SEROQUEL) tablet 25 mg  25 mg Oral QHS Loletha Grayer, MD   25 mg at 05/05/19 2308  . traMADol (ULTRAM) tablet 50 mg  50 mg Oral Q12H PRN Loletha Grayer, MD   50 mg at 05/05/19 1223     Discharge Medications: Please see discharge summary for a list of discharge medications.  Relevant Imaging Results:  Relevant Lab Results:   Additional Information SSN: [id number]  Shade Flood,

## 2019-05-06 NOTE — Progress Notes (Signed)
Patient ID: Andrea Schaefer, female   DOB: Apr 28, 1924, 86 y.o.   MRN: [id number] [facility] PROGRESS NOTE  Andrea Schaefer [id number] DOB: Jun 14, 1924 DOA: 05/02/2019 PCP: [name], MD  HPI/Subjective: Patient answers a few questions.  More alert today.  Slept last night.  Had a low-grade temperature overnight.  Breathing a little bit better.  Objective: Vitals:   05/06/19 0100 05/06/19 0733  BP:  (!) 124/57  Pulse:  88  Resp:  17  Temp: 99.3 F (37.4 C) 98.6 F (37 C)  SpO2:  95%    Intake/Output Summary (Last 24 hours) at 05/06/2019 1315 Last data filed at 05/06/2019 1148 Gross per 24 hour  Intake 100 ml  Output --  Net 100 ml   Filed Weights   05/02/19 1625  Weight: 90.7 kg    ROS: Review of Systems  Constitutional: Negative for fever.  Eyes: Negative for blurred vision.  Respiratory: Positive for shortness of breath.   Cardiovascular: Negative for chest pain.  Gastrointestinal: Negative for abdominal pain.  Genitourinary: Negative for dysuria.  Musculoskeletal: Negative for joint pain.  Neurological: Negative for headaches.   Exam: Physical Exam  HENT:  Nose: No mucosal edema.  Mouth/Throat: No oropharyngeal exudate or posterior oropharyngeal edema.  Eyes: Conjunctivae and lids are normal.  Cardiovascular: S1 normal and S2 normal. Exam reveals no gallop.  No murmur heard. Respiratory: No respiratory distress. She has decreased breath sounds in the right lower field and the left lower field. She has no wheezes. She has rhonchi in the right lower field and the left lower field. She has no rales.  GI: Soft. Bowel sounds are normal. There is no abdominal tenderness.  Musculoskeletal:     Right ankle: Swelling present.     Left ankle: Swelling present.  Lymphadenopathy:    She has no cervical adenopathy.  Neurological: She is alert.  Skin: Skin is warm. Nails show no clubbing.  Bruising right arm, left hand  Psychiatric: She has a normal mood and  affect.      Data Reviewed: Basic Metabolic Panel: Recent Labs  Lab 05/02/19 1634 05/02/19 1634 05/02/19 2246 05/03/19 0538 05/04/19 0352 05/05/19 0426 05/06/19 0328  NA 140  --   --  142 142 141 144  K 3.7  --   --  3.7 3.1* 3.5 3.8  CL 103  --   --  105 98 96* 96*  CO2 29  --   --  29 34* 37* 39*  GLUCOSE 120*  --   --  91 88 102* 116*  BUN 15  --   --  15 15 19 23   CREATININE 1.07*   < > 1.05* 0.97 0.97 1.12* 1.24*  CALCIUM 9.2  --   --  8.8* 8.5* 8.3* 8.4*  MG  --   --   --   --  1.6* 2.1 2.0  PHOS  --   --   --   --  3.2 3.0 2.8   < > = values in this interval not displayed.   Liver Function Tests: Recent Labs  Lab 05/02/19 1634  AST 16  ALT 12  ALKPHOS 79  BILITOT 0.9  PROT 7.5  ALBUMIN 3.7   CBC: Recent Labs  Lab 05/02/19 1634 05/02/19 1634 05/02/19 2246 05/03/19 0538 05/04/19 0352 05/05/19 0426 05/06/19 0328  WBC 8.7   < > 9.8 12.3* 8.7 7.8 9.2  NEUTROABS 6.8  --   --   --  5.9 4.9 5.9  HGB 13.1   < > 12.0 12.2 12.0 11.9* 12.4  HCT 43.3   < > 40.1 40.6 39.4 38.4 40.7  MCV 95.6   < > 95.7 96.0 93.6 94.1 94.2  PLT 267   < > 247 241 237 209 224   < > = values in this interval not displayed.   BNP (last 3 results) Recent Labs    05/02/19 1634  BNP 469.0*     Recent Results (from the past 240 hour(s))  Blood culture (single)     Status: None (Preliminary result)   Collection Time: 05/02/19  4:35 PM   Specimen: BLOOD  Result Value Ref Range Status   Specimen Description BLOOD RIGHT ANTECUBITAL  Final   Special Requests   Final    BOTTLES DRAWN AEROBIC AND ANAEROBIC Blood Culture adequate volume   Culture   Final    NO GROWTH 4 DAYS Performed at [facility], [address], [place], [place] [address]    Report Status PENDING  Incomplete  Blood culture (single)     Status: None (Preliminary result)   Collection Time: 05/02/19  4:54 PM   Specimen: BLOOD  Result Value Ref Range Status   Specimen Description BLOOD BLOOD LEFT  HAND  Final   Special Requests   Final    BOTTLES DRAWN AEROBIC AND ANAEROBIC Blood Culture results may not be optimal due to an inadequate volume of blood received in culture bottles   Culture   Final    NO GROWTH 4 DAYS Performed at [facility], [address], [place], [place] [address]    Report Status PENDING  Incomplete  Respiratory Panel by RT PCR (Flu A&B, Covid) - Nasopharyngeal Swab     Status: None   Collection Time: 05/02/19  5:05 PM   Specimen: Nasopharyngeal Swab  Result Value Ref Range Status   SARS Coronavirus 2 by RT PCR NEGATIVE NEGATIVE Final    Comment: (NOTE) SARS-CoV-2 target nucleic acids are NOT DETECTED. The SARS-CoV-2 RNA is generally detectable in upper respiratoy specimens during the acute phase of infection. The lowest concentration of SARS-CoV-2 viral copies this assay can detect is 131 copies/mL. A negative result does not preclude SARS-Cov-2 infection and should not be used as the sole basis for treatment or other patient management decisions. A negative result may occur with  improper specimen collection/handling, submission of specimen other than nasopharyngeal swab, presence of viral mutation(s) within the areas targeted by this assay, and inadequate number of viral copies (<131 copies/mL). A negative result must be combined with clinical observations, patient history, and epidemiological information. The expected result is Negative. Fact Sheet for Patients:  PinkCheek.be Fact Sheet for Healthcare Providers:  GravelBags.it This test is not yet ap proved or cleared by the [place] FDA and  has been authorized for detection and/or diagnosis of SARS-CoV-2 by FDA under an Emergency Use Authorization (EUA). This EUA will remain  in effect (meaning this test can be used) for the duration of the COVID-19 declaration under Section 564(b)(1) of the Act, 21 U.S.C. section  360bbb-3(b)(1), unless the authorization is terminated or revoked sooner.    Influenza A by PCR NEGATIVE NEGATIVE Final   Influenza B by PCR NEGATIVE NEGATIVE Final    Comment: (NOTE) The Xpert Xpress SARS-CoV-2/FLU/RSV assay is intended as an aid in  the diagnosis of influenza from Nasopharyngeal swab specimens and  should not be used as a sole basis for treatment. Nasal washings and  aspirates are unacceptable  for Xpert Xpress SARS-CoV-2/FLU/RSV  testing. Fact Sheet for Patients: PinkCheek.be Fact Sheet for Healthcare Providers: GravelBags.it This test is not yet approved or cleared by the [place] FDA and  has been authorized for detection and/or diagnosis of SARS-CoV-2 by  FDA under an Emergency Use Authorization (EUA). This EUA will remain  in effect (meaning this test can be used) for the duration of the  Covid-19 declaration under Section 564(b)(1) of the Act, 21  U.S.C. section 360bbb-3(b)(1), unless the authorization is  terminated or revoked. Performed at [facility], [address] [place], [place] [address]   Body fluid culture (includes gram stain)     Status: None   Collection Time: 05/02/19  8:31 PM   Specimen: Pleural Fluid  Result Value Ref Range Status   Specimen Description   Final    PLEURAL Performed at [facility], [address], [place], [place] [address]    Special Requests   Final    NONE Performed at [facility], [address] [place], [place] [address]    Gram Stain   Final    FEW WBC PRESENT, PREDOMINANTLY MONONUCLEAR NO ORGANISMS SEEN    Culture   Final    NO GROWTH 3 DAYS Performed at [facility], [place] [address], [place], [place] [address]    Report Status 05/06/2019 FINAL  Final  Urine culture     Status: None   Collection Time: 05/03/19  4:15 AM   Specimen: In/Out Cath Urine  Result Value Ref Range Status   Specimen Description    Final    IN/OUT CATH URINE Performed at [facility], [address] [place], [place] [address]    Special Requests   Final    NONE Performed at [facility], [address] [place], [place] [address]    Culture   Final    NO GROWTH Performed at [facility], [place] [address], [place], [place] [address]    Report Status 05/04/2019 FINAL  Final  MRSA PCR Screening     Status: None   Collection Time: 05/03/19  8:08 PM   Specimen: Nasal Mucosa; Nasopharyngeal  Result Value Ref Range Status   MRSA by PCR NEGATIVE NEGATIVE Final    Comment:        The GeneXpert MRSA Assay (FDA approved for NASAL specimens only), is one component of a comprehensive MRSA colonization surveillance program. It is not intended to diagnose MRSA infection nor to guide or monitor treatment for MRSA infections. Performed at [facility], [address] [place], [place] [address]      Studies: [facility] Chest [facility] 1 View  Result Date: 05/06/2019 CLINICAL DATA:  Chest tube in place EXAM: PORTABLE CHEST 1 VIEW COMPARISON:  May 04, 2019 FINDINGS: Chest tube present on the left without change in position. No pneumothorax. There is atelectatic change in the lung bases with small pleural effusions bilaterally. No frank consolidation. Heart is mildly enlarged with pulmonary vascularity normal. No adenopathy. There is aortic atherosclerosis. No bone lesions. IMPRESSION: No change in chest tube position. No pneumothorax. Small pleural effusions bilaterally with bibasilar atelectasis. Stable cardiac silhouette Aortic Atherosclerosis (ICD10-I70.0). Electronically Signed   By: Lowella Grip III M.D.   On: 05/06/2019 09:05   DG HIP UNILAT WITH PELVIS 2-3 VIEWS LEFT  Result Date: 05/05/2019 CLINICAL DATA:  Left hip pain. EXAM: DG HIP (WITH OR WITHOUT PELVIS) 2-3V LEFT COMPARISON:  February 06, 2017. FINDINGS: There is  no evidence of hip fracture or dislocation. There is no evidence of  arthropathy or other focal bone abnormality. IMPRESSION: Negative. Electronically Signed   By: Marijo Conception M.D.   On: 05/05/2019 12:42    Scheduled Meds: . atorvastatin  20 mg Oral Daily  . azithromycin  250 mg Oral Daily  . buPROPion  150 mg Oral Daily  . chlorhexidine  15 mL Mouth Rinse BID  . Chlorhexidine Gluconate Cloth  6 each Topical Daily  . enoxaparin (LOVENOX) injection  40 mg Subcutaneous Q24H  . escitalopram  20 mg Oral Daily  . furosemide  40 mg Intravenous Daily  . levothyroxine  125 mcg Oral QAC breakfast  . mouth rinse  15 mL Mouth Rinse q12n4p  . metoprolol tartrate  50 mg Oral BID  . potassium chloride  20 mEq Oral BID  . QUEtiapine  25 mg Oral QHS   Continuous Infusions: . meropenem (MERREM) IV 1 g (05/06/19 1148)    Assessment/Plan:  1. Acute on chronic hypoxic respiratory failure.  Patient currently on 3.5 L high flow nasal cannula.  Chronically wears 2.5 liters at home chronically.  Hopefully can switch over to regular nasal cannula today 2. Large left pleural effusion status post chest tube placement.  Chest tube to switch over to waterseal today.  If chest x-ray stable tomorrow potentially chest tube will come out tomorrow. 3. Acute diastolic CHF.  Continue IV Lasix daily dosing.  Patient on metoprolol. 4. Possible lobar pneumonia on meropenem and Zithromax 5. Hypokalemia and hypomagnesemia.  Replaced 6. Essential hypertension on metoprolol.  Holding amlodipine and hydralazine 7. Hypothyroidism unspecified on levothyroxine 8. Anxiety depression on Lexapro 9. Hyperlipidemia unspecified on atorvastatin 10. Confusion and insomnia.  Seroquel helped with sleeping.  Code Status:     Code Status Orders  (From admission, onward)         Start     Ordered   05/02/19 2116  Full code  Continuous     05/02/19 2131        Code Status History    Date Active Date Inactive Code Status Order ID Comments User Context   02/16/2018 1921 02/18/2018 1826 Full Code  [id number]  Hillary Bow, MD ED   02/06/2017 2332 02/11/2017 1448 Full Code [id number]  Max Sane, MD Inpatient   09/08/2015 2101 09/11/2015 1243 Full Code [id number]  Max Sane, MD Inpatient   09/12/2014 2025 09/18/2014 1609 Full Code [id number]  Hower, Aaron Mose, MD ED   Advance Care Planning Activity    Advance Directive Documentation     Most Recent Value  Type of Advance Directive  Healthcare Power of Attorney  Pre-existing out of facility DNR order (yellow form or pink MOST form)  --  "MOST" Form in Place?  --     Family Communication: Spoke with son on the phone Disposition Plan: Potentially chest tube to come out tomorrow.  Hopefully can switch over from high flow nasal cannula to normal nasal cannula.  Physical therapy recommended rehab but hopeful for a physical therapy reevaluation.  Potential disposition to rehab earliest on Wednesday if chest tube out and over to regular nasal cannula.  Consultants:  Critical care specialist  Cardiothoracic surgery  Procedures: -Chest tube placement  Antibiotics:  Meropenem  Zithromax  Time spent: 27 minutes.  Case discussed with cardiothoracic surgery  [place]  [facility]

## 2019-05-06 NOTE — TOC Initial Note (Signed)
Transition of Care [facility]) - Initial/Assessment Note    Patient Details  [name] MRN: [id number] Date of Birth: [birth year]  Transition of [facility]) CM/SW Contact:    Shade Flood, LCSW Phone Number: 05/06/2019, 3:40 PM  Clinical Narrative:                  Pt admitted from home. PT recommending SNF rehab. Pt not fully able to participate in assessment with this LCSW. Spoke with pt's son by phone. Discussed SNF rehab recommendations and he is in agreement that pt will need rehab at dc. He states pt has been at [facility] in the past. Explained that [facility] is no longer providing SNF rehab and so they will have to choose from other agencies that make a bed offer. Pt's son expresses understanding and he is agreeable to referrals to local SNFs.  Will refer and follow up in AM. MD anticipating dc Wednesday at the earliest per progress note from today.  Expected Discharge Plan: Skilled Nursing Facility Barriers to Discharge: Continued Medical Work up   Patient Goals and CMS Choice Patient states their goals for this hospitalization and ongoing recovery are:: get better CMS Medicare.gov Compare Post Acute Care list provided to:: Patient Represenative (must comment) Choice offered to / list presented to : Adult Children  Expected Discharge Plan and Services Expected Discharge Plan: [place] In-house Referral: Clinical Social Work   Post Acute Care Choice: [place] Living arrangements for the past 2 months: [place]                                      Prior Living Arrangements/Services Living arrangements for the past 2 months: Single Family Home Lives with:: Self Patient language and need for interpreter reviewed:: Yes Do you feel safe going back to the place where you live?: Yes      Need for Family Participation in Patient Care: No (Comment) Care giver support system in place?: Yes (comment) Current home services:  DME Criminal Activity/Legal Involvement Pertinent to Current Situation/Hospitalization: No - Comment as needed  Activities of Daily Living Home Assistive Devices/Equipment: None ADL Screening (condition at time of admission) Patient's cognitive ability adequate to safely complete daily activities?: Yes Is the patient deaf or have difficulty hearing?: No Does the patient have difficulty seeing, even when wearing glasses/contacts?: No Does the patient have difficulty concentrating, remembering, or making decisions?: Yes Patient able to express need for assistance with ADLs?: Yes Does the patient have difficulty dressing or bathing?: Yes Independently performs ADLs?: No Does the patient have difficulty walking or climbing stairs?: Yes Weakness of Legs: Right Weakness of Arms/Hands: Right  Permission Sought/Granted Permission sought to share information with : [name] granted to share information with : Yes, Verbal Permission Granted     Permission granted to share info w AGENCY: local snfs        Emotional Assessment       Orientation: : Oriented to Self Alcohol / Substance Use: Not Applicable Psych Involvement: No (comment)  Admission diagnosis:  Pleural effusion [J90] Acute respiratory failure with hypoxia (HCC) [J96.01] Acute on chronic respiratory failure with hypoxia (HCC) [J96.21] Chronic obstructive pulmonary disease, unspecified COPD type ([place]) [J44.9] Patient Active Problem List   Diagnosis Date Noted  . Left hip pain   . Acute diastolic CHF (congestive heart failure) ([place])   . Confusion   .  Acute congestive heart failure ([place])   . Hyperlipidemia   . Pleural effusion on left 05/02/2019  . Acute on chronic respiratory failure with hypoxia ([place]) 05/02/2019  . S/P thoracostomy tube placement ([place]) 05/02/2019  . Essential hypertension 05/02/2019  . Hypothyroidism 05/02/2019  . Anxiety and depression 05/02/2019  . Acute respiratory  failure with hypoxia ([place]) 05/02/2019  . UTI (urinary tract infection) 02/16/2018  . Chronic obstructive pulmonary disease with hypoxia ([place]) 03/07/2017  . Peripheral edema 03/06/2017  . Generalized weakness 03/06/2017  . Hypoxia 02/06/2017  . Lobar pneumonia ([place]) 09/08/2015  . Compression fracture of L3 lumbar vertebra 09/12/2014  . Intractable back pain 09/12/2014   PCP:  [name], MD Pharmacy:   [place], [place] - [place] [address] [place] [address] Phone: [phone] Fax: [phone]     Social Determinants of Health (SDOH) Interventions    Readmission Risk Interventions No flowsheet data found.

## 2019-05-06 NOTE — Progress Notes (Signed)
Physical Therapy Treatment Patient Details [name] MRN: [id number] DOB: 12-09-1924 Today's Date: 05/06/2019    History of Present Illness 86 yo female with a PMH of Hypothyroidism, Seborrheic Dermatitis, Pericarditis, Osteoporosis, HTN, Hyperlipidemia, Rosacea, Poliomyelitis, Right Breat Cancer s/p radiation, Depression, Gout, Cortical Cataract, Anxiety, and Arthritis.  She presented to [facility] ER via EMS on 03/18 from home after her son noticed she was having worsening shortness of breath, onset of symptoms 1 week prior to presentation.  At baseline she ambulates with a walker.  Chest tube converted to waterseal this date.    PT Comments    Pt is making limited progress towards goals. Cleared to participate via RN as pt now on waterseal. Takes increased coaxing to participate. Once seated at EOB, able to maintain upright posture. Several attempts to stand, however unable to lift buttocks off bed in order to stand. Somewhat self limiting and reports of back pain with upright posture. All mobility performed on 3.5L of HFNC with sats ranging 84-87% with rest/exertion. Notified RN as sats never improved past 88%. No SOB symptoms noted with exertion. Currently not safe to dc home as he isn't at baseline level.   Follow Up Recommendations  SNF     Equipment Recommendations  None recommended by PT    Recommendations for Other Services       Precautions / Restrictions Precautions Precautions: Fall Restrictions Weight Bearing Restrictions: No    Mobility  Bed Mobility Overal bed mobility: Needs Assistance Bed Mobility: Supine to Sit;Sit to Supine     Supine to sit: Mod assist Sit to supine: Mod assist;Max assist   General bed mobility comments: needs extensive cues to perform OOB. Able to slide B LEs off edge of bed and able to sit with cga. Pt then needs mod assist to return supine and max assist for sliding up towards HOB.  Transfers Overall transfer level: Needs  assistance Equipment used: Rolling walker (2 wheeled) Transfers: Sit to/from Stand Sit to Stand: Max assist         General transfer comment: needs assist to participate. Needs foot blocking to prevent sliding. Unable to fully lift buttocks off bed in order to perform standing. Reports increased back pain with mobility.  Ambulation/Gait             General Gait Details: unsafe to attempt this date   Stairs             Wheelchair Mobility    Modified Rankin (Stroke Patients Only)       Balance Overall balance assessment: Needs assistance Sitting-balance support: Feet supported Sitting balance-Leahy Scale: Good                                      Cognition Arousal/Alertness: Awake/alert Behavior During Therapy: WFL for tasks assessed/performed Overall Cognitive Status: History of cognitive impairments - at baseline                                 General Comments: confused to date/situation/place. Able to state she lives with son and usually able to ambulate      Exercises Other Exercises Other Exercises: supine/seated ther-ex performed on B LE including heel slides, hip abd/add, SAQ, LAQ, SLRs, and alt. marching. All ther-ex performed x 10 reps reporting increased difficulty with increased reps.     General  Comments        Pertinent Vitals/Pain Pain Assessment: Faces Faces Pain Scale: Hurts little more Pain Location: low back Pain Descriptors / Indicators: Aching Pain Intervention(s): Limited activity within patient's tolerance    Home Living                      Prior Function            PT Goals (current goals can now be found in the care plan section) Acute Rehab PT Goals Patient Stated Goal: go home PT Goal Formulation: With patient Time For Goal Achievement: 05/18/19 Potential to Achieve Goals: Fair Progress towards PT goals: Progressing toward goals    Frequency    Min 2X/week      PT  Plan Current plan remains appropriate    Co-evaluation              AM-PAC PT "6 Clicks" Mobility   Outcome Measure  Help needed turning from your back to your side while in a flat bed without using bedrails?: A Lot Help needed moving from lying on your back to sitting on the side of a flat bed without using bedrails?: A Lot Help needed moving to and from a bed to a chair (including a wheelchair)?: A Lot Help needed standing up from a chair using your arms (e.g., wheelchair or bedside chair)?: A Lot Help needed to walk in hospital room?: A Lot Help needed climbing 3-5 steps with a railing? : Total 6 Click Score: 11    End of Session   Activity Tolerance: Patient limited by pain;Patient limited by fatigue Patient left: in bed;with bed alarm set Nurse Communication: Mobility status PT Visit Diagnosis: Muscle weakness (generalized) (M62.81);Difficulty in walking, not elsewhere classified (R26.2);Unsteadiness on feet (R26.81)     Time: 1353-1416 PT Time Calculation (min) (ACUTE ONLY): 23 min  Charges:  $Therapeutic Exercise: 8-22 mins $Therapeutic Activity: 8-22 mins                     Andrea Schaefer, PT, DPT [phone]    Andrea Schaefer 05/06/2019, 2:47 PM

## 2019-05-07 ENCOUNTER — Inpatient Hospital Stay: Payer: Medicare Other

## 2019-05-07 DIAGNOSIS — E876 Hypokalemia: Secondary | ICD-10-CM

## 2019-05-07 LAB — BASIC METABOLIC PANEL
Anion gap: 11 (ref 5–15)
BUN: 25 mg/dL — ABNORMAL HIGH (ref 8–23)
CO2: 38 mmol/L — ABNORMAL HIGH (ref 22–32)
Calcium: 8.3 mg/dL — ABNORMAL LOW (ref 8.9–10.3)
Chloride: 93 mmol/L — ABNORMAL LOW (ref 98–111)
Creatinine, Ser: 1.26 mg/dL — ABNORMAL HIGH (ref 0.44–1.00)
GFR calc Af Amer: 43 mL/min — ABNORMAL LOW (ref >60)
GFR calc non Af Amer: 37 mL/min — ABNORMAL LOW (ref >60)
Glucose, Bld: 88 mg/dL (ref 70–99)
Potassium: 3.6 mmol/L (ref 3.5–5.1)
Sodium: 142 mmol/L (ref 135–145)

## 2019-05-07 LAB — CBC WITH DIFFERENTIAL/PLATELET
Abs Immature Granulocytes: 0.02 10*3/uL (ref 0.00–0.07)
Basophils Absolute: 0 10*3/uL (ref 0.0–0.1)
Basophils Relative: 0 %
Eosinophils Absolute: 0.3 10*3/uL (ref 0.0–0.5)
Eosinophils Relative: 4 %
HCT: 38.4 % (ref 36.0–46.0)
Hemoglobin: 11.6 g/dL — ABNORMAL LOW (ref 12.0–15.0)
Immature Granulocytes: 0 %
Lymphocytes Relative: 18 %
Lymphs Abs: 1.4 10*3/uL (ref 0.7–4.0)
MCH: 28.7 pg (ref 26.0–34.0)
MCHC: 30.2 g/dL (ref 30.0–36.0)
MCV: 95 fL (ref 80.0–100.0)
Monocytes Absolute: 1.3 10*3/uL — ABNORMAL HIGH (ref 0.1–1.0)
Monocytes Relative: 17 %
Neutro Abs: 4.7 10*3/uL (ref 1.7–7.7)
Neutrophils Relative %: 61 %
Platelets: 206 10*3/uL (ref 150–400)
RBC: 4.04 MIL/uL (ref 3.87–5.11)
RDW: 16.2 % — ABNORMAL HIGH (ref 11.5–15.5)
WBC: 7.8 10*3/uL (ref 4.0–10.5)
nRBC: 0 % (ref 0.0–0.2)

## 2019-05-07 LAB — CULTURE, BLOOD (SINGLE)
Culture: NO GROWTH
Culture: NO GROWTH
Special Requests: ADEQUATE

## 2019-05-07 LAB — MAGNESIUM: Magnesium: 2.1 mg/dL (ref 1.7–2.4)

## 2019-05-07 LAB — PHOSPHORUS: Phosphorus: 2.7 mg/dL (ref 2.5–4.6)

## 2019-05-07 MED ORDER — FUROSEMIDE 20 MG PO TABS
20.0000 mg | ORAL_TABLET | Freq: Every day | ORAL | Status: DC
  Administered 2019-05-07 – 2019-05-10 (×4): 20 mg via ORAL
  Filled 2019-05-07 (×4): qty 1

## 2019-05-07 MED ORDER — LORAZEPAM 2 MG/ML IJ SOLN
1.0000 mg | Freq: Once | INTRAMUSCULAR | Status: AC
  Administered 2019-05-07: 1 mg via INTRAVENOUS

## 2019-05-07 MED ORDER — POTASSIUM CHLORIDE 20 MEQ PO PACK
20.0000 meq | PACK | Freq: Every day | ORAL | Status: DC
  Administered 2019-05-07 – 2019-05-10 (×4): 20 meq via ORAL
  Filled 2019-05-07 (×4): qty 1

## 2019-05-07 MED ORDER — HALOPERIDOL LACTATE 5 MG/ML IJ SOLN
1.0000 mg | Freq: Four times a day (QID) | INTRAMUSCULAR | Status: DC | PRN
  Administered 2019-05-08: 1 mg via INTRAVENOUS
  Filled 2019-05-07: qty 1

## 2019-05-07 MED ORDER — LORAZEPAM 2 MG/ML IJ SOLN
INTRAMUSCULAR | Status: AC
  Filled 2019-05-07: qty 1

## 2019-05-07 MED ORDER — LORAZEPAM 0.5 MG PO TABS
0.5000 mg | ORAL_TABLET | Freq: Once | ORAL | Status: AC
  Administered 2019-05-07: 0.5 mg via ORAL
  Filled 2019-05-07: qty 1

## 2019-05-07 NOTE — Progress Notes (Signed)
Patient ID: Andrea Schaefer, female   DOB: [birth year], 86 y.o.   MRN: [id number] [facility] PROGRESS NOTE  Andrea Schaefer [id number] DOB: 1924-12-01 DOA: 05/02/2019 PCP: [name], MD  HPI/Subjective: Patient had a rough night with pulling out 3 IVs.  Did not sleep very well.  It looks like she also received 2 doses of Ativan.  Patient required a sitter.  Patient answered a few questions.  Objective: Vitals:   05/07/19 0030 05/07/19 0810  BP:  (!) 130/55  Pulse:  76  Resp:  16  Temp:  (!) 97.5 F (36.4 C)  SpO2: 92% 94%    Intake/Output Summary (Last 24 hours) at 05/07/2019 1154 Last data filed at 05/07/2019 1045 Gross per 24 hour  Intake 720 ml  Output 750 ml  Net -30 ml   Filed Weights   05/02/19 1625  Weight: 90.7 kg    ROS: Review of Systems  Unable to perform ROS: Acuity of condition  Respiratory: Positive for shortness of breath.   Cardiovascular: Negative for chest pain.  Gastrointestinal: Negative for abdominal pain.   Exam: Physical Exam  HENT:  Nose: No mucosal edema.  Mouth/Throat: No oropharyngeal exudate or posterior oropharyngeal edema.  Eyes: Conjunctivae and lids are normal.  Cardiovascular: S1 normal and S2 normal. Exam reveals no gallop.  No murmur heard. Respiratory: No respiratory distress. She has decreased breath sounds in the right lower field and the left lower field. She has no wheezes. She has rhonchi in the right lower field and the left lower field. She has no rales.  GI: Soft. Bowel sounds are normal. There is no abdominal tenderness.  Musculoskeletal:     Right ankle: Swelling present.     Left ankle: Swelling present.  Lymphadenopathy:    She has no cervical adenopathy.  Neurological: She is alert.  Skin: Skin is warm. Nails show no clubbing.  Bruising right arm, left hand  Psychiatric: She has a normal mood and affect.      Data Reviewed: Basic Metabolic Panel: Recent Labs  Lab 05/03/19 0538 05/04/19 0352  05/05/19 0426 05/06/19 0328 05/07/19 0427  NA 142 142 141 144 142  K 3.7 3.1* 3.5 3.8 3.6  CL 105 98 96* 96* 93*  CO2 29 34* 37* 39* 38*  GLUCOSE 91 88 102* 116* 88  BUN 15 15 19 23  25*  CREATININE 0.97 0.97 1.12* 1.24* 1.26*  CALCIUM 8.8* 8.5* 8.3* 8.4* 8.3*  MG  --  1.6* 2.1 2.0 2.1  PHOS  --  3.2 3.0 2.8 2.7   Liver Function Tests: Recent Labs  Lab 05/02/19 1634  AST 16  ALT 12  ALKPHOS 79  BILITOT 0.9  PROT 7.5  ALBUMIN 3.7   CBC: Recent Labs  Lab 05/02/19 1634 05/02/19 2246 05/03/19 0538 05/04/19 0352 05/05/19 0426 05/06/19 0328 05/07/19 0427  WBC 8.7   < > 12.3* 8.7 7.8 9.2 7.8  NEUTROABS 6.8  --   --  5.9 4.9 5.9 4.7  HGB 13.1   < > 12.2 12.0 11.9* 12.4 11.6*  HCT 43.3   < > 40.6 39.4 38.4 40.7 38.4  MCV 95.6   < > 96.0 93.6 94.1 94.2 95.0  PLT 267   < > 241 237 209 224 206   < > = values in this interval not displayed.   BNP (last 3 results) Recent Labs    05/02/19 1634  BNP 469.0*     Recent Results (from the past 240 hour(s))  Blood culture (single)     Status: None   Collection Time: 05/02/19  4:35 PM   Specimen: BLOOD  Result Value Ref Range Status   Specimen Description BLOOD RIGHT ANTECUBITAL  Final   Special Requests   Final    BOTTLES DRAWN AEROBIC AND ANAEROBIC Blood Culture adequate volume   Culture   Final    NO GROWTH 5 DAYS Performed at [facility], [address] [place], [place] [address]    Report Status 05/07/2019 FINAL  Final  Blood culture (single)     Status: None   Collection Time: 05/02/19  4:54 PM   Specimen: BLOOD  Result Value Ref Range Status   Specimen Description BLOOD BLOOD LEFT HAND  Final   Special Requests   Final    BOTTLES DRAWN AEROBIC AND ANAEROBIC Blood Culture results may not be optimal due to an inadequate volume of blood received in culture bottles   Culture   Final    NO GROWTH 5 DAYS Performed at [facility], [address] [place], [place] [address]    Report Status  05/07/2019 FINAL  Final  Respiratory Panel by RT PCR (Flu A&B, Covid) - Nasopharyngeal Swab     Status: None   Collection Time: 05/02/19  5:05 PM   Specimen: Nasopharyngeal Swab  Result Value Ref Range Status   SARS Coronavirus 2 by RT PCR NEGATIVE NEGATIVE Final    Comment: (NOTE) SARS-CoV-2 target nucleic acids are NOT DETECTED. The SARS-CoV-2 RNA is generally detectable in upper respiratoy specimens during the acute phase of infection. The lowest concentration of SARS-CoV-2 viral copies this assay can detect is 131 copies/mL. A negative result does not preclude SARS-Cov-2 infection and should not be used as the sole basis for treatment or other patient management decisions. A negative result may occur with  improper specimen collection/handling, submission of specimen other than nasopharyngeal swab, presence of viral mutation(s) within the areas targeted by this assay, and inadequate number of viral copies (<131 copies/mL). A negative result must be combined with clinical observations, patient history, and epidemiological information. The expected result is Negative. Fact Sheet for Patients:  PinkCheek.be Fact Sheet for Healthcare Providers:  GravelBags.it This test is not yet ap proved or cleared by the [place] FDA and  has been authorized for detection and/or diagnosis of SARS-CoV-2 by FDA under an Emergency Use Authorization (EUA). This EUA will remain  in effect (meaning this test can be used) for the duration of the COVID-19 declaration under Section 564(b)(1) of the Act, 21 U.S.C. section 360bbb-3(b)(1), unless the authorization is terminated or revoked sooner.    Influenza A by PCR NEGATIVE NEGATIVE Final   Influenza B by PCR NEGATIVE NEGATIVE Final    Comment: (NOTE) The Xpert Xpress SARS-CoV-2/FLU/RSV assay is intended as an aid in  the diagnosis of influenza from Nasopharyngeal swab specimens and  should  not be used as a sole basis for treatment. Nasal washings and  aspirates are unacceptable for Xpert Xpress SARS-CoV-2/FLU/RSV  testing. Fact Sheet for Patients: PinkCheek.be Fact Sheet for Healthcare Providers: GravelBags.it This test is not yet approved or cleared by the [place] FDA and  has been authorized for detection and/or diagnosis of SARS-CoV-2 by  FDA under an Emergency Use Authorization (EUA). This EUA will remain  in effect (meaning this test can be used) for the duration of the  Covid-19 declaration under Section 564(b)(1) of the Act, 21  U.S.C. section 360bbb-3(b)(1), unless the authorization is  terminated  or revoked. Performed at [facility], [address] [place], [place] [address]   Body fluid culture (includes gram stain)     Status: None   Collection Time: 05/02/19  8:31 PM   Specimen: Pleural Fluid  Result Value Ref Range Status   Specimen Description   Final    PLEURAL Performed at [facility], [address] [place], [place] [address]    Special Requests   Final    NONE Performed at [facility], [address] [place], [place] [address]    Gram Stain   Final    FEW WBC PRESENT, PREDOMINANTLY MONONUCLEAR NO ORGANISMS SEEN    Culture   Final    NO GROWTH 3 DAYS Performed at [facility], [place] [address] [place], [place] [address]    Report Status 05/06/2019 FINAL  Final  Urine culture     Status: None   Collection Time: 05/03/19  4:15 AM   Specimen: In/Out Cath Urine  Result Value Ref Range Status   Specimen Description   Final    IN/OUT CATH URINE Performed at [facility], [address] [place], [place] [address]    Special Requests   Final    NONE Performed at [facility], [address], [place], [place] [address]    Culture   Final    NO GROWTH Performed at [facility], [place] [address], [place], [place]  [address]    Report Status 05/04/2019 FINAL  Final  MRSA PCR Screening     Status: None   Collection Time: 05/03/19  8:08 PM   Specimen: Nasal Mucosa; Nasopharyngeal  Result Value Ref Range Status   MRSA by PCR NEGATIVE NEGATIVE Final    Comment:        The GeneXpert MRSA Assay (FDA approved for NASAL specimens only), is one component of a comprehensive MRSA colonization surveillance program. It is not intended to diagnose MRSA infection nor to guide or monitor treatment for MRSA infections. Performed at [facility], [address], [place],  [address]      Studies: [facility] Chest [place] 1 View  Result Date: 05/07/2019 CLINICAL DATA:  86 year old female under postoperative evaluation. Hypertension. EXAM: PORTABLE CHEST 1 VIEW COMPARISON:  Chest x-ray 05/06/2019. FINDINGS: Left chest tube in position with tip projecting over the lower left hemithorax. One side-port appears within the thoracic cage, while the other side port appears exterior to the thoracic cage. Bibasilar opacities (left greater than right) which may reflect areas of atelectasis and/or consolidation. Small bilateral pleural effusions. No definite pneumothorax. Cephalization of the pulmonary vasculature. Mild cardiomegaly. Upper mediastinal contours are within normal limits. Aortic atherosclerosis. IMPRESSION: 1. Stable radiographic appearance the chest, as detailed above. Electronically Signed   By: Vinnie Langton M.D.   On: 05/07/2019 09:08   DG Chest Port 1 View  Result Date: 05/06/2019 CLINICAL DATA:  Chest tube in place EXAM: PORTABLE CHEST 1 VIEW COMPARISON:  May 04, 2019 FINDINGS: Chest tube present on the left without change in position. No pneumothorax. There is atelectatic change in the lung bases with small pleural effusions bilaterally. No frank consolidation. Heart is mildly enlarged with pulmonary vascularity normal. No adenopathy. There is aortic atherosclerosis. No bone lesions. IMPRESSION: No  change in chest tube position. No pneumothorax. Small pleural effusions bilaterally with bibasilar atelectasis. Stable cardiac silhouette Aortic Atherosclerosis (ICD10-I70.0). Electronically Signed   By: Lowella Grip III M.D.   On: 05/06/2019 09:05  DG HIP UNILAT WITH PELVIS 2-3 VIEWS LEFT  Result Date: 05/05/2019 CLINICAL DATA:  Left hip pain. EXAM: DG HIP (WITH OR WITHOUT PELVIS) 2-3V LEFT COMPARISON:  February 06, 2017. FINDINGS: There is no evidence of hip fracture or dislocation. There is no evidence of arthropathy or other focal bone abnormality. IMPRESSION: Negative. Electronically Signed   By: Marijo Conception M.D.   On: 05/05/2019 12:42    Scheduled Meds: . atorvastatin  20 mg Oral Daily  . azithromycin  250 mg Oral Daily  . buPROPion  150 mg Oral Daily  . chlorhexidine  15 mL Mouth Rinse BID  . Chlorhexidine Gluconate Cloth  6 each Topical Daily  . enoxaparin (LOVENOX) injection  40 mg Subcutaneous Q24H  . escitalopram  20 mg Oral Daily  . furosemide  20 mg Oral Daily  . levothyroxine  125 mcg Oral QAC breakfast  . LORazepam      . mouth rinse  15 mL Mouth Rinse q12n4p  . metoprolol tartrate  50 mg Oral BID  . potassium chloride  20 mEq Oral Daily  . QUEtiapine  25 mg Oral QHS   Continuous Infusions: . cefTRIAXone (ROCEPHIN)  IV Stopped (05/06/19 2134)    Assessment/Plan:  1. Acute delirium.  Discontinue any pain medications.  Would avoid Ativan in this patient.  Seroquel at night and as needed Haldol for agitation.  Patient currently has a Actuary.   2. Acute on chronic hypoxic respiratory failure.  Patient currently on 5 L high flow nasal cannula.  Chronically wears 2.5 liters at home chronically. 3. Large left pleural effusion status post chest tube placement.  Chest tube switched over to waterseal yesterday.  Cardiothoracic surgery to follow-up on whether or not they can remove the chest tube 4. Acute diastolic CHF.  Switch to oral Lasix with her pulling out IVs.   Patient on metoprolol. 5. Possible lobar pneumonia on Rocephin and Zithromax 6. Hypokalemia and hypomagnesemia.  Replacing oral potassium.  Magnesium in normal range 7. Essential hypertension on metoprolol.  Holding amlodipine and hydralazine 8. Hypothyroidism unspecified on levothyroxine 9. Anxiety depression on Lexapro 10. Hyperlipidemia unspecified on atorvastatin 11. I discussed potential of palliative care consultation.  Son would like to hold off on that right now.  If things do not get better he will consider palliative care.  Code Status:     Code Status Orders  (From admission, onward)         Start     Ordered   05/02/19 2116  Full code  Continuous     05/02/19 2131        Code Status History    Date Active Date Inactive Code Status Order ID Comments User Context   02/16/2018 1921 02/18/2018 1826 Full Code [id number]  Hillary Bow, MD ED   02/06/2017 2332 02/11/2017 1448 Full Code [id number]  Max Sane, MD Inpatient   09/08/2015 2101 09/11/2015 1243 Full Code [id number]  Max Sane, MD Inpatient   09/12/2014 2025 09/18/2014 1609 Full Code [id number]  Hower, Aaron Mose, MD ED   Advance Care Planning Activity    Advance Directive Documentation     Most Recent Value  Type of Advance Directive  Healthcare Power of Attorney  Pre-existing out of facility DNR order (yellow form or pink MOST form)  --  "MOST" Form in Place?  --     Family Communication: Spoke with son on the phone Disposition Plan: Acute delirium will need to clear prior to any disposition.  As  of this morning patient still had chest tube in.  Patient was also on high flow nasal cannula 5 L.  Patient will need to be switched over to regular nasal cannula prior to disposition.  The patient will likely end up needing rehab.  I am suspecting will need a few more days here in the hospital  Consultants:  Critical care specialist  Cardiothoracic surgery  Procedures: -Chest tube  placement  Antibiotics:  Rocephin  Zithromax  Time spent: 26 minutes.   [place]  [facility] [employer]

## 2019-05-07 NOTE — Plan of Care (Signed)

## 2019-05-07 NOTE — Progress Notes (Signed)
Patient agitated and pulling at chest tube. NP notified, came to floor to assess patient. Verbal order for 1 mg Ativan. Mitts applied. Ativan given with good effect. Will continue to monitor.

## 2019-05-07 NOTE — TOC Progression Note (Signed)
Transition of Care [facility]) - Progression Note    Patient Details  [name] MRN: [id number] Date of Birth: [birth year]  Transition of Care [facility]) CM/SW Contact  Shade Flood, LCSW Phone Number: 05/07/2019, 2:30 PM  Clinical Narrative:     TOC following. Pt status discussed with MD this AM. Per MD, pt was agitated over night pulling out three lines. Pt now with mitts and a sitter. MD aware pt will need to be sitter/mitt free for at least 24 hours prior to dc to SNF. Bed offers are available and pending. TOC will follow up with family once pt stabilizes to determine their wishes for rehab placement. MD anticipating several more hospital days.  Expected Discharge Plan: [place] Barriers to Discharge: Continued Medical Work up  Expected Discharge Plan and Services Expected Discharge Plan: [place] In-house Referral: Clinical Social Work   Post Acute Care Choice: [place] Living arrangements for the past 2 months: Single Family Home                                       Social Determinants of Health (SDOH) Interventions    Readmission Risk Interventions No flowsheet data found.

## 2019-05-07 NOTE — Progress Notes (Signed)
Progress Note  Patient [name] Date of Encounter: 05/07/2019  Primary Cardiologist: New to [facility] - consult by Fletcher Anon  Subjective   Agitated overnight. No chest pain or dyspnea. Slightly up trending BUN/SCr.  Inpatient Medications    Scheduled Meds: . atorvastatin  20 mg Oral Daily  . azithromycin  250 mg Oral Daily  . buPROPion  150 mg Oral Daily  . chlorhexidine  15 mL Mouth Rinse BID  . Chlorhexidine Gluconate Cloth  6 each Topical Daily  . enoxaparin (LOVENOX) injection  40 mg Subcutaneous Q24H  . escitalopram  20 mg Oral Daily  . furosemide  40 mg Intravenous Daily  . levothyroxine  125 mcg Oral QAC breakfast  . LORazepam      . mouth rinse  15 mL Mouth Rinse q12n4p  . metoprolol tartrate  50 mg Oral BID  . potassium chloride  20 mEq Oral BID  . QUEtiapine  25 mg Oral QHS   Continuous Infusions: . cefTRIAXone (ROCEPHIN)  IV Stopped (05/06/19 2134)   PRN Meds: ipratropium-albuterol, morphine injection, traMADol   Vital Signs    Vitals:   05/06/19 2310 05/07/19 0014 05/07/19 0030 05/07/19 0810  BP:  118/74  (!) 130/55  Pulse: 82 84  76  Resp:  17  16  Temp:  98.3 F (36.8 C)  (!) 97.5 F (36.4 C)  TempSrc:  Oral  Oral  SpO2: 93% (!) 64% 92% 94%  Weight:      Height:        Intake/Output Summary (Last 24 hours) at 05/07/2019 0842 Last data filed at 05/07/2019 [id number] Gross per 24 hour  Intake 560 ml  Output 750 ml  Net -190 ml   Filed Weights   05/02/19 1625  Weight: 90.7 kg    Telemetry    Not on tele - Personally Reviewed  ECG    No new tracings - Personally Reviewed  Physical Exam   GEN: No acute distress.   Neck: No JVD. Cardiac: RRR, no murmurs, rubs, or gallops.  Respiratory: Diminished breath sounds bilaterally.  GI: Soft, nontender, non-distended.   MS: No edema; No deformity. Neuro:  Alert and oriented x 3; Nonfocal.  Psych: Normal affect.  Labs    Chemistry Recent Labs  Lab 05/02/19 1634 05/02/19 2246  05/05/19 0426 05/06/19 0328 05/07/19 0427  NA 140   < > 141 144 142  K 3.7   < > 3.5 3.8 3.6  CL 103   < > 96* 96* 93*  CO2 29   < > 37* 39* 38*  GLUCOSE 120*   < > 102* 116* 88  BUN 15   < > 19 23 25*  CREATININE 1.07*   < > 1.12* 1.24* 1.26*  CALCIUM 9.2   < > 8.3* 8.4* 8.3*  PROT 7.5  --   --   --   --   ALBUMIN 3.7  --   --   --   --   AST 16  --   --   --   --   ALT 12  --   --   --   --   ALKPHOS 79  --   --   --   --   BILITOT 0.9  --   --   --   --   GFRNONAA 45*   < > 43* 38* 37*  GFRAA 52*   < > 49* 44* 43*  ANIONGAP 8   < >  8 9 11    < > = values in this interval not displayed.     Hematology Recent Labs  Lab 05/05/19 0426 05/06/19 0328 05/07/19 0427  WBC 7.8 9.2 7.8  RBC 4.08 4.32 4.04  HGB 11.9* 12.4 11.6*  HCT 38.4 40.7 38.4  MCV 94.1 94.2 95.0  MCH 29.2 28.7 28.7  MCHC 31.0 30.5 30.2  RDW 16.5* 16.3* 16.2*  PLT 209 224 206    Cardiac EnzymesNo results for input(s): TROPONINI in the last 168 hours. No results for input(s): TROPIPOC in the last 168 hours.   BNP Recent Labs  Lab 05/02/19 1634  BNP 469.0*     DDimer No results for input(s): DDIMER in the last 168 hours.   Radiology    DG Chest Port 1 View  Result Date: 05/06/2019 IMPRESSION: No change in chest tube position. No pneumothorax. Small pleural effusions bilaterally with bibasilar atelectasis. Stable cardiac silhouette Aortic Atherosclerosis (ICD10-I70.0). Electronically Signed   By: Lowella Grip III M.D.   On: 05/06/2019 09:05   DG HIP UNILAT WITH PELVIS 2-3 VIEWS LEFT  Result Date: 05/05/2019 IMPRESSION: Negative. Electronically Signed   By: Marijo Conception M.D.   On: 05/05/2019 12:42    Cardiac Studies   2D echo 05/03/2019: 1. Left ventricular ejection fraction, by estimation, is 60 to 65%. The  left ventricle has normal function. The left ventricle has no regional  wall motion abnormalities. There is moderate left ventricular hypertrophy.  Left ventricular diastolic  parameters are consistent with Grade I diastolic dysfunction (impaired relaxation). Elevated left atrial pressure.  2. There is moderate pulmonary hypertension (PASP 40-45 mmHg plus central venous pressure). Right ventricular systolic function is low normal. The  right ventricular size is moderately enlarged. mildly increased right  ventricular wall thickness.  3. Right atrial size was mildly dilated.  4. Question small posterior pericardial effusion, though internal echos  raise possibility of complex fluid or soft tissue in the pericardial  space.. The pericardial effusion is posterior to the left ventricle.  5. The mitral valve is rheumatic. Trivial mitral valve regurgitation. No  evidence of mitral stenosis.  6. Tricuspid valve regurgitation is mild to moderate.  7. The aortic valve is tricuspid. Aortic valve regurgitation is not  visualized. Mild aortic valve sclerosis is present, with no evidence of  aortic valve stenosis.   Patient Profile     86 y.o. female with history of chronic hypoxic respiratory failure on supplemental oxygen at 2.5 L via nasal cannula since 2019 following hosptial admission for dyspnea, reported pericarditis, right sided breast cancer s/p partial mastectomy and lumpectomy in 2011, poliomyelitis, HTN, HLD, hypothyroidism, gout and anxiety who is being seen today for the evaluation of dyspnea at the request of Dr. Leslye Peer.  Assessment & Plan    1. Acute on chronic hypoxic respiratory failure: -Multifactorial including exudative pleural effusion s/p chest tube placement, pulmonary hypertension, HFpEF and possible pulmonary infiltrate  -She has been on supplemental oxygen since 2019 following admission for dyspnea in which workup was largely unrevealing -Dyspnea improved following chest tube placement and diuresis with subsequent bump in renal function  2. Acute diastolic CHF/pulmonary hypertension: -Symptoms improved following chest tube placement and  diuresis  -Change IV Lasix 40 mg daily to PO 20 mg daily in an effort to prevent over diuresis  -She was never a smoker -May need sleep study  -Follow up with pulmonology -If symptoms persist, consider RHC as an outpatient to further gauge hemodynamics and treatment  3. AKI: -Change IV Lasix 40 mg daily to PO 20 mg daily in an effort to prevent over diuresis   4. Large left pleural effusion: -Appears to be exudative and possibly in the setting of PNA -Status post chest tube -Symptoms improving -Cytology without malignancy with chronic inflammatory cells noted  5. Likely PNA: -ABX per IM  6. HTN: -BP currently well controlled -Continue current therapy per IM  7. HLD: -Lipitor   For questions or updates, please contact [place] Please consult [email or web address] for contact info under Cardiology/STEMI.    Signed, Christell Faith, PA-C [place] Pager: [phone] 05/07/2019, 8:42 AM

## 2019-05-08 DIAGNOSIS — J449 Chronic obstructive pulmonary disease, unspecified: Secondary | ICD-10-CM

## 2019-05-08 DIAGNOSIS — Z938 Other artificial opening status: Secondary | ICD-10-CM

## 2019-05-08 LAB — PHOSPHORUS: Phosphorus: 2.8 mg/dL (ref 2.5–4.6)

## 2019-05-08 LAB — CBC WITH DIFFERENTIAL/PLATELET
Abs Immature Granulocytes: 0.01 10*3/uL (ref 0.00–0.07)
Basophils Absolute: 0 10*3/uL (ref 0.0–0.1)
Basophils Relative: 1 %
Eosinophils Absolute: 0.3 10*3/uL (ref 0.0–0.5)
Eosinophils Relative: 6 %
HCT: 35.8 % — ABNORMAL LOW (ref 36.0–46.0)
Hemoglobin: 11.1 g/dL — ABNORMAL LOW (ref 12.0–15.0)
Immature Granulocytes: 0 %
Lymphocytes Relative: 22 %
Lymphs Abs: 1.2 10*3/uL (ref 0.7–4.0)
MCH: 28.9 pg (ref 26.0–34.0)
MCHC: 31 g/dL (ref 30.0–36.0)
MCV: 93.2 fL (ref 80.0–100.0)
Monocytes Absolute: 0.9 10*3/uL (ref 0.1–1.0)
Monocytes Relative: 16 %
Neutro Abs: 3.1 10*3/uL (ref 1.7–7.7)
Neutrophils Relative %: 55 %
Platelets: 219 10*3/uL (ref 150–400)
RBC: 3.84 MIL/uL — ABNORMAL LOW (ref 3.87–5.11)
RDW: 15.9 % — ABNORMAL HIGH (ref 11.5–15.5)
WBC: 5.6 10*3/uL (ref 4.0–10.5)
nRBC: 0 % (ref 0.0–0.2)

## 2019-05-08 LAB — BASIC METABOLIC PANEL
Anion gap: 10 (ref 5–15)
BUN: 23 mg/dL (ref 8–23)
CO2: 37 mmol/L — ABNORMAL HIGH (ref 22–32)
Calcium: 8.5 mg/dL — ABNORMAL LOW (ref 8.9–10.3)
Chloride: 96 mmol/L — ABNORMAL LOW (ref 98–111)
Creatinine, Ser: 1.05 mg/dL — ABNORMAL HIGH (ref 0.44–1.00)
GFR calc Af Amer: 53 mL/min — ABNORMAL LOW (ref >60)
GFR calc non Af Amer: 46 mL/min — ABNORMAL LOW (ref >60)
Glucose, Bld: 79 mg/dL (ref 70–99)
Potassium: 3.9 mmol/L (ref 3.5–5.1)
Sodium: 143 mmol/L (ref 135–145)

## 2019-05-08 LAB — MAGNESIUM: Magnesium: 2.2 mg/dL (ref 1.7–2.4)

## 2019-05-08 MED ORDER — HALOPERIDOL LACTATE 5 MG/ML IJ SOLN: 1.0000 mg | Freq: Three times a day (TID) | INTRAMUSCULAR | Status: DC | PRN

## 2019-05-08 NOTE — Progress Notes (Signed)
Progress Note  Patient [name] Date of Encounter: 05/08/2019  Primary Cardiologist: New- Dr. Fletcher Anon  Subjective   Patient was agitated.  Given sedated meds overnight.  Currently with mittens on and sleepy. Pulled out her chest tube earlier this morning  Inpatient Medications    Scheduled Meds: . atorvastatin  20 mg Oral Daily  . buPROPion  150 mg Oral Daily  . chlorhexidine  15 mL Mouth Rinse BID  . Chlorhexidine Gluconate Cloth  6 each Topical Daily  . enoxaparin (LOVENOX) injection  40 mg Subcutaneous Q24H  . escitalopram  20 mg Oral Daily  . furosemide  20 mg Oral Daily  . levothyroxine  125 mcg Oral QAC breakfast  . mouth rinse  15 mL Mouth Rinse q12n4p  . metoprolol tartrate  50 mg Oral BID  . potassium chloride  20 mEq Oral Daily  . QUEtiapine  25 mg Oral QHS   Continuous Infusions: . cefTRIAXone (ROCEPHIN)  IV Stopped (05/07/19 2030)   PRN Meds: haloperidol lactate, ipratropium-albuterol   Vital Signs    Vitals:   05/07/19 2010 05/07/19 2229 05/08/19 0010 05/08/19 0736  BP:  (!) 118/49 (!) 112/48 135/64  Pulse:  71 80 74  Resp:   18 16  Temp:    97.8 F (36.6 C)  TempSrc:    Oral  SpO2: 94%  95% 96%  Weight:      Height:        Intake/Output Summary (Last 24 hours) at 05/08/2019 1209 Last data filed at 05/08/2019 0915 Gross per 24 hour  Intake 120 ml  Output --  Net 120 ml   Last 3 Weights 05/02/2019 02/18/2018 02/17/2018  Weight (lbs) 200 lb 230 lb 224 lb  Weight (kg) 90.719 kg 104.327 kg 101.606 kg      Telemetry    Sinus rhythm, 90- Personally Reviewed  ECG    New ECG obtained- Personally Reviewed  Physical Exam   GEN: No acute distress, somnolent.   Neck: No JVD Cardiac: RRR, no murmurs, rubs, or gallops.  Respiratory:  Decreased breath sounds at bases, poor inspiratory effort GI: Soft, nontender, non-distended  MS: No edema; No deformity. Neuro:  Nonfocal  Psych: Unable to assess, somnolent  Labs    High Sensitivity  Troponin:   Recent Labs  Lab 05/02/19 1634 05/02/19 1933  TROPONINIHS 9 7      Chemistry Recent Labs  Lab 05/02/19 1634 05/02/19 2246 05/06/19 0328 05/07/19 0427 05/08/19 0424  NA 140   < > 144 142 143  K 3.7   < > 3.8 3.6 3.9  CL 103   < > 96* 93* 96*  CO2 29   < > 39* 38* 37*  GLUCOSE 120*   < > 116* 88 79  BUN 15   < > 23 25* 23  CREATININE 1.07*   < > 1.24* 1.26* 1.05*  CALCIUM 9.2   < > 8.4* 8.3* 8.5*  PROT 7.5  --   --   --   --   ALBUMIN 3.7  --   --   --   --   AST 16  --   --   --   --   ALT 12  --   --   --   --   ALKPHOS 79  --   --   --   --   BILITOT 0.9  --   --   --   --   GFRNONAA 45*   < >  38* 37* 46*  GFRAA 52*   < > 44* 43* 53*  ANIONGAP 8   < > 9 11 10    < > = values in this interval not displayed.     Hematology Recent Labs  Lab 05/06/19 0328 05/07/19 0427 05/08/19 0424  WBC 9.2 7.8 5.6  RBC 4.32 4.04 3.84*  HGB 12.4 11.6* 11.1*  HCT 40.7 38.4 35.8*  MCV 94.2 95.0 93.2  MCH 28.7 28.7 28.9  MCHC 30.5 30.2 31.0  RDW 16.3* 16.2* 15.9*  PLT 224 206 219    BNP Recent Labs  Lab 05/02/19 1634  BNP 469.0*     DDimer No results for input(s): DDIMER in the last 168 hours.   Radiology    DG Chest Port 1 View  Result Date: 05/07/2019 CLINICAL DATA:  86 year old female under postoperative evaluation. Hypertension. EXAM: PORTABLE CHEST 1 VIEW COMPARISON:  Chest x-ray 05/06/2019. FINDINGS: Left chest tube in position with tip projecting over the lower left hemithorax. One side-port appears within the thoracic cage, while the other side port appears exterior to the thoracic cage. Bibasilar opacities (left greater than right) which may reflect areas of atelectasis and/or consolidation. Small bilateral pleural effusions. No definite pneumothorax. Cephalization of the pulmonary vasculature. Mild cardiomegaly. Upper mediastinal contours are within normal limits. Aortic atherosclerosis. IMPRESSION: 1. Stable radiographic appearance the chest, as  detailed above. Electronically Signed   By: Vinnie Langton M.D.   On: 05/07/2019 09:08    Cardiac Studies   2D echo 05/03/2019: 1. Left ventricular ejection fraction, by estimation, is 60 to 65%. The  left ventricle has normal function. The left ventricle has no regional  wall motion abnormalities. There is moderate left ventricular hypertrophy.  Left ventricular diastolic parameters are consistent with Grade I diastolic dysfunction (impaired relaxation). Elevated left atrial pressure.  2. There is moderate pulmonary hypertension (PASP 40-45 mmHg plus central venous pressure). Right ventricular systolic function is low normal. The  right ventricular size is moderately enlarged. mildly increased right  ventricular wall thickness.  3. Right atrial size was mildly dilated.  4. Question small posterior pericardial effusion, though internal echos  raise possibility of complex fluid or soft tissue in the pericardial  space.. The pericardial effusion is posterior to the left ventricle.  5. The mitral valve is rheumatic. Trivial mitral valve regurgitation. No  evidence of mitral stenosis.  6. Tricuspid valve regurgitation is mild to moderate.  7. The aortic valve is tricuspid. Aortic valve regurgitation is not  visualized. Mild aortic valve sclerosis is present, with no evidence of  aortic valve stenosis.  Patient Profile     86 y.o. female with history of chronic hypoxic respiratory failure on supplemental oxygen at 2.5 L via nasal cannula since 2019 following hosptial admission for dyspnea, reported pericarditis, right sided breast cancer s/p partial mastectomy and lumpectomy in 2011, poliomyelitis, HTN, HLD, hypothyroidism, gout and anxiety, seen for dyspnea, found to have left pleural effusion status post chest tube placement..  Assessment & Plan    1. Dyspnea -likely multifactorial, due to respiratory failure, pleural effusion, pneumonia or deconditioning -echo showed normal EF  with impaired relaxation. Moderately elevated pulmonary pressure. -Continue current diuresing with p.o. Lasix, creatinine improved -no further cardiac testing is planned at this point.  2. Large left pleural effusion, possible underlying pneumonia -unsure of underlying pneumonia -chest tube was placed but patient removed -management as per primary team  3. Acute kidney injury -creatinine improving -cont lasix at 20mg  qd    [facility] will sign off.   Medication Recommendations: Continue p.o. Lasix 20 daily. Follow up as an outpatient: With Dr. Fletcher Anon.  For questions or updates, please contact [place] Please consult [email or web address] for contact info under        Signed, Kate Sable, MD  05/08/2019, 12:09 PM

## 2019-05-08 NOTE — Progress Notes (Signed)
[facility] [place] [name]    MR#:  [id number]  DATE OF BIRTH:  [birth year]  SUBJECTIVE:   Patient currently resting quietly. Received Haldol last night according to RN. Has sitter in the room. No severe agitation Per RN used to have left sided chest tube REVIEW OF SYSTEMS:   Review of Systems  Unable to perform ROS: Mental status change   Tolerating Diet: Tolerating PT:   DRUG ALLERGIES:   Allergies  Allergen Reactions  . Codeine Nausea And Vomiting  . Tetracyclines & Related Nausea And Vomiting  . Adhesive [Tape] Rash  . Keflex [Cephalexin] Rash  . Naproxen Rash    Patient was on both Keflex and Naproxen when developed rash -  unclear as to which caused rash    VITALS:  Blood pressure 135/64, pulse 74, temperature 97.8 F (36.6 C), temperature source Oral, resp. rate 16, height 5\' 5"  (1.651 m), weight 90.7 kg, SpO2 96 %.  PHYSICAL EXAMINATION:   Physical Exam limited exam  GENERAL:  86 y.o.-year-old patient lying in the bed with no acute distress. Resting quietly LUNGS: Normal breath sounds bilaterally, no wheezing, rales, rhonchi. No use of accessory muscles of respiration. Left-sided chest tube CARDIOVASCULAR: S1, S2 normal. No murmurs, rubs, or gallops.  ABDOMEN: Soft, nontender, nondistended. Bowel sounds present. No organomegaly or mass.  EXTREMITIES: No cyanosis, clubbing or edema b/l.    NEUROLOGIC: moves extremities spontaneously  PSYCHIATRIC:  patient is resting SKIN: No obvious rash, lesion, or ulcer.--per RN   LABORATORY PANEL:  CBC Recent Labs  Lab 05/08/19 0424  WBC 5.6  HGB 11.1*  HCT 35.8*  PLT 219    Chemistries  Recent Labs  Lab 05/02/19 1634 05/02/19 2246 05/08/19 0424  NA 140   < > 143  K 3.7   < > 3.9  CL 103   < > 96*  CO2 29   < > 37*  GLUCOSE 120*   < > 79  BUN 15   < > 23  CREATININE 1.07*   < > 1.05*  CALCIUM 9.2   < > 8.5*  MG  --    < > 2.2  AST 16  --   --    ALT 12  --   --   ALKPHOS 79  --   --   BILITOT 0.9  --   --    < > = values in this interval not displayed.   Cardiac Enzymes No results for input(s): TROPONINI in the last 168 hours. RADIOLOGY:  DG Chest Port 1 View  Result Date: 05/07/2019 CLINICAL DATA:  86 year old female under postoperative evaluation. Hypertension. EXAM: PORTABLE CHEST 1 VIEW COMPARISON:  Chest x-ray 05/06/2019. FINDINGS: Left chest tube in position with tip projecting over the lower left hemithorax. One side-port appears within the thoracic cage, while the other side port appears exterior to the thoracic cage. Bibasilar opacities (left greater than right) which may reflect areas of atelectasis and/or consolidation. Small bilateral pleural effusions. No definite pneumothorax. Cephalization of the pulmonary vasculature. Mild cardiomegaly. Upper mediastinal contours are within normal limits. Aortic atherosclerosis. IMPRESSION: 1. Stable radiographic appearance the chest, as detailed above. Electronically Signed   By: Vinnie Langton M.D.   On: 05/07/2019 09:08   DG Chest Port 1 View  Result Date: 05/06/2019 CLINICAL DATA:  Chest tube in place EXAM: PORTABLE CHEST 1 VIEW COMPARISON:  May 04, 2019 FINDINGS: Chest tube present on the left without  change in position. No pneumothorax. There is atelectatic change in the lung bases with small pleural effusions bilaterally. No frank consolidation. Heart is mildly enlarged with pulmonary vascularity normal. No adenopathy. There is aortic atherosclerosis. No bone lesions. IMPRESSION: No change in chest tube position. No pneumothorax. Small pleural effusions bilaterally with bibasilar atelectasis. Stable cardiac silhouette Aortic Atherosclerosis (ICD10-I70.0). Electronically Signed   By: Lowella Grip III M.D.   On: 05/06/2019 09:05   ASSESSMENT AND PLAN:  Andrea Schaefer is a 86 y.o. female with medical history significant for HTN, hypothyroidism, depression and anxiety, on home O2  at 2.5 L/min for the past 2 years, with no documented underlying pulmonary condition, who was brought into the emergency room with shortness of breath and difficulty breathing.  #Acute delirium. -Patient currently has a sitter and meds. No severe agitation Per RN although did require dose of Haldol last night. -Seroquel at night -continue to monitor. RN to make more safety rounds that way sitter can be discontinued  #Acute on chronic hypoxic respiratory failure secondary to large left sided pleural effusion status post chest tube placement -chest tube was switch to water seal -Dr. Genevive Bi to follow-up where are not taken remove the tube -patient currently on 5 L high flow--- on chronic 2.5 L nasal cannula -RN to wean oxygen as tolerated. Keep sats greater than [id number]  #Acute diastolic CHF  -on po lasix -continue metoprolol  # lobar pneumonia on chest x-ray -completed azithromycin and Rocephin today  #Hypokalemia and hypomgnesimia -repleted  #Essential hypertension -continue metoprolol -currently holding amlodipine and hydralazine. Blood pressure stable. Will resume if needed  #Hypothyroidism -continue Synthroid  #Chronic anxiety/depression -on Lexapro  #Hyperlipidemia  -on atorvastatin  Procedures: left sided chest tube on 3/19 Family communication : son [name] on phone Consults :dr Genevive Bi Discharge Disposition :From home with son--To SNF when CT tube out and pt off sitter for 24 hours CODE STATUS: FULL DVT Prophylaxis :SCD Barriers to discharge:  TOTAL TIME TAKING CARE OF THIS PATIENT: *25* minutes.  >50% time spent on counselling and coordination of care  Note: This dictation was prepared with Dragon dictation along with smaller phrase technology. Any transcriptional errors that result from this process are unintentional.  Fritzi Mandes M.D    [facility]   CC: Primary care physician; [name], MDPatient ID: Andrea Schaefer, female   DOB: 1924-07-03, 86 y.o.    MRN: [id number]

## 2019-05-08 NOTE — Progress Notes (Signed)
Patient ID: Andrea Schaefer, female   DOB: 1924-05-29, 86 y.o.   MRN: [id number]  Today she is more confused.  She is somewhat somnolent.  She does have a sitter in her room because she is pulling at her chest tube.  I am unable to obtain any history.  There is no air leak today.  There is been minimal drainage from the chest tube over the last 24 hours.  I have independently reviewed her chest x-ray from yesterday morning.  There is no evidence of a pneumothorax or pleural effusion with the tube on waterseal.  The skin entrance site is clean dry and intact.  I removed her chest tube today without incident.  Sterile dressings were applied.  As per the chart, the family is making decisions about long-term care.

## 2019-05-08 NOTE — Progress Notes (Signed)
PT Cancellation Note  Patient Details [name] MRN: [id number] DOB: 11-21-1924   Cancelled Treatment:    Reason Eval/Treat Not Completed: Other (comment). Treatment attempted and chest tube removed today. Pt with sitter in room. Per sitter, pt has been obtunded all day, doesn't awaken to any stimuli and hasn't ate any meals. Pt with mits applied. Removed to check O2, WNL on 3L of O2. Unable to work with therapy. Will re-attempt when able to participate.   Carlitos Bottino 05/08/2019, 2:01 PM  Greggory Stallion, PT, DPT [phone]

## 2019-05-08 NOTE — TOC Progression Note (Signed)
Transition of Care [facility]) - Progression Note    Patient Details  [name] MRN: [id number] Date of Birth: 10-27-1924  Transition of Care [facility]) CM/SW Contact  Breda Bond, Gardiner Rhyme, LCSW Phone Number: 05/08/2019, 3:35 PM  Clinical Narrative:  Spoke with son-Jamie who reports they have chosen [facility] when she is medically ready to transfer from here. He wants her close to [facility] due to his younger brother lives with pt and could visit her at facility once visitor restriction is lifted. Will need to be restraint free before can go to facility. Continue to work on discharge needs.    Expected Discharge Plan: [place] Barriers to Discharge: Continued Medical Work up  Expected Discharge Plan and Services Expected Discharge Plan: [place] In-house Referral: Clinical Social Work   Post Acute Care Choice: [place] Living arrangements for the past 2 months: Single Family Home                                       Social Determinants of Health (SDOH) Interventions    Readmission Risk Interventions No flowsheet data found.

## 2019-05-08 NOTE — TOC Progression Note (Signed)
Transition of Care [facility]) - Progression Note    Patient Details  [name] MRN: [id number] Date of Birth: 06-21-1924  Transition of Care [facility]) CM/SW Contact  Malkia Nippert, Gardiner Rhyme, LCSW Phone Number: 05/08/2019, 10:00 AM  Clinical Narrative:  Spoke with son-James via telephone to discuss three bed offers and he will look into them and let this worker know his preference so when pt is ready to transfer the bed will be there. Continue to follow and work on SNF     Expected Discharge Plan: [place] Barriers to Discharge: Continued Medical Work up  Expected Discharge Plan and Services Expected Discharge Plan: [place] In-house Referral: Clinical Social Work   Post Acute Care Choice: [place] Living arrangements for the past 2 months: Single Family Home                                       Social Determinants of Health (SDOH) Interventions    Readmission Risk Interventions No flowsheet data found.

## 2019-05-09 ENCOUNTER — Inpatient Hospital Stay: Payer: Medicare Other

## 2019-05-09 LAB — CBC WITH DIFFERENTIAL/PLATELET
Abs Immature Granulocytes: 0.02 10*3/uL (ref 0.00–0.07)
Basophils Absolute: 0 10*3/uL (ref 0.0–0.1)
Basophils Relative: 0 %
Eosinophils Absolute: 0.3 10*3/uL (ref 0.0–0.5)
Eosinophils Relative: 4 %
HCT: 39.6 % (ref 36.0–46.0)
Hemoglobin: 12 g/dL (ref 12.0–15.0)
Immature Granulocytes: 0 %
Lymphocytes Relative: 14 %
Lymphs Abs: 1.1 10*3/uL (ref 0.7–4.0)
MCH: 28.8 pg (ref 26.0–34.0)
MCHC: 30.3 g/dL (ref 30.0–36.0)
MCV: 95.2 fL (ref 80.0–100.0)
Monocytes Absolute: 1.1 10*3/uL — ABNORMAL HIGH (ref 0.1–1.0)
Monocytes Relative: 15 %
Neutro Abs: 5.1 10*3/uL (ref 1.7–7.7)
Neutrophils Relative %: 67 %
Platelets: 237 10*3/uL (ref 150–400)
RBC: 4.16 MIL/uL (ref 3.87–5.11)
RDW: 15.9 % — ABNORMAL HIGH (ref 11.5–15.5)
WBC: 7.6 10*3/uL (ref 4.0–10.5)
nRBC: 0 % (ref 0.0–0.2)

## 2019-05-09 LAB — SARS CORONAVIRUS 2 (TAT 6-24 HRS): SARS Coronavirus 2: NEGATIVE

## 2019-05-09 LAB — CREATININE, SERUM
Creatinine, Ser: 1.02 mg/dL — ABNORMAL HIGH (ref 0.44–1.00)
GFR calc Af Amer: 55 mL/min — ABNORMAL LOW (ref >60)
GFR calc non Af Amer: 48 mL/min — ABNORMAL LOW (ref >60)

## 2019-05-09 MED ORDER — PNEUMOCOCCAL VAC POLYVALENT 25 MCG/0.5ML IJ INJ
0.5000 mL | INJECTION | INTRAMUSCULAR | Status: AC
  Administered 2019-05-10: 0.5 mL via INTRAMUSCULAR
  Filled 2019-05-09: qty 0.5

## 2019-05-09 NOTE — TOC Progression Note (Signed)
Transition of Care [facility]) - Progression Note    Patient Details  [name] MRN: [id number] Date of Birth: 07/17/1924  Transition of Care [facility]) CM/SW Contact  Triva Hueber, Gardiner Rhyme, LCSW Phone Number: 05/09/2019, 1:56 PM  Clinical Narrative:   Hardie Pulley called he has changed his mind and wants pt to go to [facility] in [place] since they are allowing visitors. Have contacted Ricki-Adm at [facility] and he can accept her tomorrow. Have asked son to call Ricki to set up time to do paperwork. Will work on pt going to [facility]    Expected Discharge Plan: [place] Barriers to Discharge: Continued Medical Work up  [employer] and Services Expected Discharge Plan: [place] In-house Referral: Clinical Social Work   Post Acute Care Choice: [place] Living arrangements for the past 2 months: Single Family Home                                       Social Determinants of Health (SDOH) Interventions    Readmission Risk Interventions No flowsheet data found.

## 2019-05-09 NOTE — TOC Progression Note (Signed)
Transition of Care [facility]) - Progression Note    Patient Details  [name] MRN: [id number] Date of Birth: 09/17/1924  Transition of Care [facility]) CM/SW Contact  Germaine Ripp, Gardiner Rhyme, LCSW Phone Number: 05/09/2019, 10:15 AM  Clinical Narrative:  According to MD pt will be ready to transfer to [facility] tomorrow. Pt is much brighter and less confused. Sitter has been discharged. Have contacted son to make aware of plan for tomorrow.     Expected Discharge Plan: [place] Barriers to Discharge: Continued Medical Work up  Expected Discharge Plan and Services Expected Discharge Plan: [place] In-house Referral: Clinical Social Work   Post Acute Care Choice: [place] Living arrangements for the past 2 months: Single Family Home                                       Social Determinants of Health (SDOH) Interventions    Readmission Risk Interventions No flowsheet data found.

## 2019-05-09 NOTE — Care Management Important Message (Signed)
Important Message  Patient Details  [name] MRN: [id number] Date of Birth: 07-28-1924   Medicare Important Message Given:  Yes     [name] 05/09/2019, 11:38 AM

## 2019-05-09 NOTE — Progress Notes (Signed)
Physical Therapy Treatment Patient Details [name] MRN: [id number] DOB: [birth year] Today's Date: 05/09/2019    History of Present Illness 86 yo female with a PMH of Hypothyroidism, Seborrheic Dermatitis, Pericarditis, Osteoporosis, HTN, Hyperlipidemia, Rosacea, Poliomyelitis, Right Breat Cancer s/p radiation, Depression, Gout, Cortical Cataract, Anxiety, and Arthritis.  She presented to [facility] ER via EMS on 03/18 from home after her son noticed she was having worsening shortness of breath, onset of symptoms 1 week prior to presentation.  At baseline she ambulates with a walker.  Chest tube converted to waterseal this date.    PT Comments    Pt lethargic but agrees to session and awakes with constant verbal cues to an appropriate level for therapy.  To EOB with min a x 1 primarily to raise trunk.  Once sitting she is steady.  Stands x 3 to RW wpulling up on walker despite cues.  She only stands briefly each attempt with min/mod a x 2 before asking and initiating sit back on bed.  She does c/o RUE pain from elbow to forearm which she attributes to her inability to remain standing.  MD in during session and discussed arm pain limiting pt.  She has not bruising or increased edema compared to LUE.  Allows limited ROM at elbow.  Was hoping for pt to sit in recliner for lunch but she returns to bed and falls quickly back to sleep.   Follow Up Recommendations  SNF     Equipment Recommendations  None recommended by PT    Recommendations for Other Services       Precautions / Restrictions Precautions Precautions: Fall Restrictions Weight Bearing Restrictions: No    Mobility  Bed Mobility Overal bed mobility: Needs Assistance Bed Mobility: Supine to Sit;Sit to Supine     Supine to sit: Min assist;Mod assist Sit to supine: Mod assist;+2 for physical assistance      Transfers Overall transfer level: Needs assistance Equipment used: Rolling walker (2 wheeled) Transfers: Sit  to/from Stand Sit to Stand: Min assist;Mod assist;+2 physical assistance         General transfer comment: stands briefly x 3 but sits primarily due to R UE pain  Ambulation/Gait             General Gait Details: unsafe to attempt this date   Stairs             Wheelchair Mobility    Modified Rankin (Stroke Patients Only)       Balance Overall balance assessment: Needs assistance   Sitting balance-Leahy Scale: Good     Standing balance support: Bilateral upper extremity supported Standing balance-Leahy Scale: Poor Standing balance comment: stands briefly before sitting.                            Cognition Arousal/Alertness: Lethargic Behavior During Therapy: WFL for tasks assessed/performed Overall Cognitive Status: History of cognitive impairments - at baseline                                 General Comments: confused to date/situation/place. Able to state she lives with son and usually able to ambulate      Exercises      General Comments        Pertinent Vitals/Pain Pain Assessment: Faces Faces Pain Scale: Hurts whole lot Pain Location: R arm - elbow and forearm Pain Descriptors /  Indicators: Aching;Guarding;Grimacing Pain Intervention(s): Limited activity within patient's tolerance;Repositioned;Monitored during session;Other (comment)(Discussed with MD)    Home Living                      Prior Function            PT Goals (current goals can now be found in the care plan section) Progress towards PT goals: Progressing toward goals    Frequency    Min 2X/week      PT Plan Current plan remains appropriate    Co-evaluation              AM-PAC PT "6 Clicks" Mobility   Outcome Measure  Help needed turning from your back to your side while in a flat bed without using bedrails?: A Little Help needed moving from lying on your back to sitting on the side of a flat bed without using  bedrails?: A Little Help needed moving to and from a bed to a chair (including a wheelchair)?: A Lot Help needed standing up from a chair using your arms (e.g., wheelchair or bedside chair)?: A Lot Help needed to walk in hospital room?: A Lot Help needed climbing 3-5 steps with a railing? : Total 6 Click Score: 13    End of Session Equipment Utilized During Treatment: Gait belt Activity Tolerance: Patient limited by pain;Patient limited by fatigue Patient left: in bed;with bed alarm set;with call bell/phone within reach Nurse Communication: Mobility status       Time: [id number] PT Time Calculation (min) (ACUTE ONLY): 26 min  Charges:  $Therapeutic Activity: 23-37 mins                    Chesley Noon, PTA 05/09/19, 11:13 AM

## 2019-05-09 NOTE — Progress Notes (Signed)
[facility] [place] [name]    MR#:  [id number]  DATE OF BIRTH:  [birth year]  SUBJECTIVE:   Patient more awake answered few questions appropriately. Has some baseline confusion. Working with physical therapy.  No severe agitation Per RN left-sided chest tube removed yesterday by Dr. Genevive Bi no respiratory distress REVIEW OF SYSTEMS:   Review of Systems  Unable to perform ROS: Dementia   Tolerating Diet: yes Tolerating PT: rehab  DRUG ALLERGIES:   Allergies  Allergen Reactions  . Codeine Nausea And Vomiting  . Tetracyclines & Related Nausea And Vomiting  . Adhesive [Tape] Rash  . Keflex [Cephalexin] Rash  . Naproxen Rash    Patient was on both Keflex and Naproxen when developed rash -  unclear as to which caused rash    VITALS:  Blood pressure (!) 110/54, pulse 79, temperature 97.7 F (36.5 C), temperature source Oral, resp. rate 16, height 5\' 5"  (1.651 m), weight 90.7 kg, SpO2 94 %.  PHYSICAL EXAMINATION:   Physical Exam limited exam  GENERAL:  86 y.o.-year-old patient lying in the bed with no acute distress. Resting quietly LUNGS: Normal breath sounds bilaterally, no wheezing, rales, rhonchi. No use of accessory muscles of respiration. Left-sided chest tube-- removed CARDIOVASCULAR: S1, S2 normal. No murmurs, rubs, or gallops.  ABDOMEN: Soft, nontender, nondistended. Bowel sounds present. No organomegaly or mass.  EXTREMITIES: No cyanosis, clubbing or edema b/l.   DJD changes NEUROLOGIC: moves extremities spontaneously , no focal weakness.  PSYCHIATRIC:  patient alert and awake. Some baseline confusion SKIN: No obvious rash, lesion, or ulcer.--per RN   LABORATORY PANEL:  CBC Recent Labs  Lab 05/09/19 0427  WBC 7.6  HGB 12.0  HCT 39.6  PLT 237    Chemistries  Recent Labs  Lab 05/02/19 1634 05/02/19 2246 05/08/19 0424 05/08/19 0424 05/09/19 0427  NA 140   < > 143  --   --   K 3.7   < > 3.9  --   --    CL 103   < > 96*  --   --   CO2 29   < > 37*  --   --   GLUCOSE 120*   < > 79  --   --   BUN 15   < > 23  --   --   CREATININE 1.07*   < > 1.05*   < > 1.02*  CALCIUM 9.2   < > 8.5*  --   --   MG  --    < > 2.2  --   --   AST 16  --   --   --   --   ALT 12  --   --   --   --   ALKPHOS 79  --   --   --   --   BILITOT 0.9  --   --   --   --    < > = values in this interval not displayed.   Cardiac Enzymes No results for input(s): TROPONINI in the last 168 hours. RADIOLOGY:  DG Chest Port 1 View  Result Date: 05/09/2019 CLINICAL DATA:  Pleural effusion and infiltrate EXAM: PORTABLE CHEST 1 VIEW COMPARISON:  May 07, 2019 FINDINGS: Chest tube has been removed from the left side. No pneumothorax. Layering pleural effusion on the left persists. There is consolidation in the right base with a lesser degree of patchy opacity in the left  base, similar to recent study. There is mild cardiomegaly with pulmonary venous hypertension. No adenopathy. There is aortic atherosclerosis. No bone lesions. IMPRESSION: Airspace opacity in the lung bases, more on the right than on the left, stable. Layering effusion on the left, essentially stable. No pneumothorax following chest tube removal. Stable cardiomegaly with a degree of pulmonary vascular congestion. Aortic Atherosclerosis (ICD10-I70.0). Electronically Signed   By: Lowella Grip III M.D.   On: 05/09/2019 08:38   ASSESSMENT AND PLAN:  Andrea Schaefer is a 86 y.o. female with medical history significant for HTN, hypothyroidism, depression and anxiety, on home O2 at 2.5 L/min for the past 2 years, with no documented underlying pulmonary condition, who was brought into the emergency room with shortness of breath and difficulty breathing.  #Acute delirium resolved -Patient currently has a sitter and meds. No severe agitation Per RN -Seroquel at night -continue to monitor. RN to make more safety rounds  - sitter discontinued this morning. -Discontinue  Haldol  #Acute on chronic hypoxic respiratory failure secondary to large left sided pleural effusion status post chest tube placement -chest tube was switch to water seal---moved on 05/08/2019 -repeat chest x-ray this morning remains stable. Per Dr. Genevive Bi okay for discharge from his standpoint -patient currently on 4 L high flow--- on chronic 2.5 L nasal cannula -RN to wean oxygen as tolerated. Keep sats greater than [id number]  #Acute diastolic CHF  -on po lasix -continue metoprolol  # lobar pneumonia on chest x-ray -completed azithromycin and Rocephin   #Hypokalemia and hypomgnesimia -repleted  #Essential hypertension -continue metoprolol -currently holding amlodipine and hydralazine. Blood pressure stable. Will resume if needed  #Hypothyroidism -continue Synthroid  #Chronic anxiety/depression -on Lexapro  #Hyperlipidemia  -on atorvastatin  Procedures: left sided chest tube on 3/19 Family communication : son [name] on phone Consults :dr Genevive Bi Discharge Disposition :From home with son--To SNF tomorrow since patient has to be sitter free for 24 hours , repeat COVID sent CODE STATUS: FULL DVT Prophylaxis :SCD Barriers to discharge: none  TOTAL TIME TAKING CARE OF THIS PATIENT: *25* minutes.  >50% time spent on counselling and coordination of care  Note: This dictation was prepared with Dragon dictation along with smaller phrase technology. Any transcriptional errors that result from this process are unintentional.  Fritzi Mandes M.D    [facility]   CC: Primary care physician; [name], MDPatient ID: Andrea Schaefer, female   DOB: Apr 09, 1924, 86 y.o.   MRN: [id number]

## 2019-05-09 NOTE — Plan of Care (Signed)

## 2019-05-10 MED ORDER — ALBUTEROL SULFATE HFA 108 (90 BASE) MCG/ACT IN AERS: 1.0000 | INHALATION_SPRAY | Freq: Four times a day (QID) | RESPIRATORY_TRACT | 0 refills | Status: AC | PRN

## 2019-05-10 MED ORDER — ALBUTEROL SULFATE (2.5 MG/3ML) 0.083% IN NEBU: 3.0000 mL | INHALATION_SOLUTION | Freq: Four times a day (QID) | RESPIRATORY_TRACT | Status: DC | PRN

## 2019-05-10 MED ORDER — FUROSEMIDE 20 MG PO TABS: 20.0000 mg | ORAL_TABLET | Freq: Every day | ORAL | 0 refills | Status: AC

## 2019-05-10 NOTE — Discharge Summary (Signed)
[facility] [place] NAME: Trinka Hastey    MR#:  [id number]  DATE OF BIRTH:  03/05/1924  DATE OF ADMISSION:  05/02/2019 ADMITTING PHYSICIAN: Loletha Grayer, MD  DATE OF DISCHARGE: 05/10/2019  PRIMARY CARE PHYSICIAN: [name], MD    ADMISSION DIAGNOSIS:  Pleural effusion [J90] Acute respiratory failure with hypoxia (HCC) [J96.01] Acute on chronic respiratory failure with hypoxia (HCC) [J96.21] Chronic obstructive pulmonary disease, unspecified COPD type ([place]) [J44.9]  DISCHARGE DIAGNOSIS:  Acute on Chronic hypoxic respiratory failure Large left Pleural effusion s/p CT placement Acute Diastolic CHF COPD Lobar Pneumonia SECONDARY DIAGNOSIS:   Past Medical History:  Diagnosis Date  . Anxiety    unspecified  . Arthritis   . Cancer [facility]) 2011   Right  . Cataract cortical, senile   . Depression   . Gout   . History of breast cancer   . History of poliomyelitis    as a child  . History of rosacea   . Hyperlipidemia, unspecified   . Hypertension   . Hypothyroidism    unspecified  . Osteoporosis, post-menopausal   . Pericarditis   . Personal history of radiation therapy   . Renal insufficiency   . Seborrheic dermatitis   . Thyroid disease     HOSPITAL COURSE:  ZIYONA GAGEL a 86 y.o.femalewith medical history significant forHTN, hypothyroidism, depression and anxiety, on home O2 at 2.5 L/min for the past 2 years, with no documented underlying pulmonary condition, who was brought into the emergency room with shortness of breath and difficulty breathing.  #Acute delirium resolved -continue to monitor. RN to make more safety rounds  -Discontinue Haldol -mentation improved  #Acute on chronic hypoxic respiratory failure secondary to large left sided pleural effusion status post chest tube placement with h/o COPD -chest tube was switch to water seal---removed on 05/08/2019 -repeat chest x-ray this morning remains  stable. Per Dr. Genevive Bi okay for discharge from his standpoint -patient currently on 3 L high flow--- on chronic 2.5 L nasal cannula -RN to wean oxygen as tolerated. Keep sats greater than [id number]  #Acute diastolic CHF  -on po lasix 20 mg daily per cardiology -continue metoprolol -pt will f/u [facility] cardiology as out pt  # lobar pneumonia on chest x-ray -completed azithromycin and Rocephin   #Hypokalemia and hypomgnesimia -repleted  #Essential hypertension -continue metoprolol -currently holding amlodipine and hydralazine. Blood pressure stable. Resume if needed as out pt  #Hypothyroidism -continue Synthroid  #Chronic anxiety/depression -on Lexapro  #Hyperlipidemia  -on atorvastatin  Procedures: left sided chest tube on 3/19 Family communication : son [name] on phone Consults :dr Genevive Bi Discharge Disposition :From home with son--To SNF today--[facility]. repea COVID negative CODE STATUS: FULL DVT Prophylaxis :SCD Barriers to discharge: none  CONSULTS OBTAINED:  Treatment Team:  Ottie Glazier, MD  DRUG ALLERGIES:   Allergies  Allergen Reactions  . Codeine Nausea And Vomiting  . Tetracyclines & Related Nausea And Vomiting  . Adhesive [Tape] Rash  . Keflex [Cephalexin] Rash  . Naproxen Rash    Patient was on both Keflex and Naproxen when developed rash -  unclear as to which caused rash    DISCHARGE MEDICATIONS:   Allergies as of 05/10/2019      Reactions   Codeine Nausea And Vomiting   Tetracyclines & Related Nausea And Vomiting   Adhesive [tape] Rash   Keflex [cephalexin] Rash   Naproxen Rash   Patient was on both Keflex and Naproxen when developed rash -  unclear as to which caused rash      Medication List    STOP taking these medications   amLODipine 10 MG tablet Commonly known as: NORVASC   hydrALAZINE 25 MG tablet Commonly known as: APRESOLINE     TAKE these medications   acetaminophen 325 MG tablet Commonly known as: TYLENOL Take 2  tablets (650 mg total) by mouth every 6 (six) hours as needed for mild pain, moderate pain or headache (headache).   albuterol 108 (90 Base) MCG/ACT inhaler Commonly known as: VENTOLIN HFA Inhale 1-2 puffs into the lungs every 6 (six) hours as needed for wheezing or shortness of breath.   atorvastatin 20 MG tablet Commonly known as: LIPITOR Take 20 mg by mouth daily.   buPROPion 150 MG 24 hr tablet Commonly known as: WELLBUTRIN XL Take 150 mg by mouth daily.   cholecalciferol 25 MCG (1000 UNIT) tablet Commonly known as: VITAMIN D Take 2,000 Units by mouth daily.   escitalopram 20 MG tablet Commonly known as: LEXAPRO Take 20 mg by mouth daily.   furosemide 20 MG tablet Commonly known as: LASIX Take 1 tablet (20 mg total) by mouth daily.   levothyroxine 125 MCG tablet Commonly known as: SYNTHROID Take 125 mcg by mouth daily before breakfast.   metoprolol tartrate 50 MG tablet Commonly known as: LOPRESSOR Take 1 tablet (50 mg total) by mouth 2 (two) times daily.   polyethylene glycol 17 g packet Commonly known as: MIRALAX / GLYCOLAX Take 17 g by mouth daily as needed for moderate constipation or severe constipation.   potassium chloride 10 MEQ CR capsule Commonly known as: MICRO-K Take 10 mEq by mouth daily.       If you experience worsening of your admission symptoms, develop shortness of breath, life threatening emergency, suicidal or homicidal thoughts you must seek medical attention immediately by calling 911 or calling your MD immediately  if symptoms less severe.  You Must read complete instructions/literature along with all the possible adverse reactions/side effects for all the Medicines you take and that have been prescribed to you. Take any new Medicines after you have completely understood and accept all the possible adverse reactions/side effects.   Please note  You were cared for by a hospitalist during your hospital stay. If you have any questions about  your discharge medications or the care you received while you were in the hospital after you are discharged, you can call the unit and asked to speak with the hospitalist on call if the hospitalist that took care of you is not available. Once you are discharged, your primary care physician will handle any further medical issues. Please note that NO REFILLS for any discharge medications will be authorized once you are discharged, as it is imperative that you return to your primary care physician (or establish a relationship with a primary care physician if you do not have one) for your aftercare needs so that they can reassess your need for medications and monitor your lab values. Today   SUBJECTIVE   Right arm pain  VITAL SIGNS:  Blood pressure (!) 112/48, pulse 74, temperature 98.4 F (36.9 C), temperature source Oral, resp. rate 16, height 5\' 5"  (1.651 m), weight 90.7 kg, SpO2 92 %.  I/O:    Intake/Output Summary (Last 24 hours) at 05/10/2019 0759 Last data filed at 05/10/2019 0634 Gross per 24 hour  Intake --  Output 300 ml  Net -300 ml    PHYSICAL EXAMINATION:  GENERAL:  86 y.o.-year-old  patient lying in the bed with no acute distress. weak EYES: Pupils equal, round, reactive to light and accommodation. No scleral icterus.  HEENT: Head atraumatic, normocephalic. Oropharynx and nasopharynx clear.  NECK:  Supple, no jugular venous distention. No thyroid enlargement, no tenderness.  LUNGS: Normal breath sounds bilaterally, no wheezing, rales,rhonchi or crepitation. No use of accessory muscles of respiration.  CARDIOVASCULAR: S1, S2 normal. No murmurs, rubs, or gallops.  ABDOMEN: Soft, non-tender, non-distended. Bowel sounds present. No organomegaly or mass.  EXTREMITIES: No pedal edema, cyanosis, or clubbing. Right arm bruise+, DJD changes + NEUROLOGIC: Cranial nerves II through XII are intact. Muscle strength 5/5 in all extremities. Sensation intact. Gait not checked.  PSYCHIATRIC:  patient is alert and awake SKIN: No obvious rash, lesion, or ulcer.   DATA REVIEW:   CBC  Recent Labs  Lab 05/09/19 0427  WBC 7.6  HGB 12.0  HCT 39.6  PLT 237    Chemistries  Recent Labs  Lab 05/08/19 0424 05/08/19 0424 05/09/19 0427  NA 143  --   --   K 3.9  --   --   CL 96*  --   --   CO2 37*  --   --   GLUCOSE 79  --   --   BUN 23  --   --   CREATININE 1.05*   < > 1.02*  CALCIUM 8.5*  --   --   MG 2.2  --   --    < > = values in this interval not displayed.    Microbiology Results   Recent Results (from the past 240 hour(s))  Blood culture (single)     Status: None   Collection Time: 05/02/19  4:35 PM   Specimen: BLOOD  Result Value Ref Range Status   Specimen Description BLOOD RIGHT ANTECUBITAL  Final   Special Requests   Final    BOTTLES DRAWN AEROBIC AND ANAEROBIC Blood Culture adequate volume   Culture   Final    NO GROWTH 5 DAYS Performed at [facility], [address] [place], [place] [address]    Report Status 05/07/2019 FINAL  Final  Blood culture (single)     Status: None   Collection Time: 05/02/19  4:54 PM   Specimen: BLOOD  Result Value Ref Range Status   Specimen Description BLOOD BLOOD LEFT HAND  Final   Special Requests   Final    BOTTLES DRAWN AEROBIC AND ANAEROBIC Blood Culture results may not be optimal due to an inadequate volume of blood received in culture bottles   Culture   Final    NO GROWTH 5 DAYS Performed at [facility], [address] [place], [place] [address]    Report Status 05/07/2019 FINAL  Final  Respiratory Panel by RT PCR (Flu A&B, Covid) - Nasopharyngeal Swab     Status: None   Collection Time: 05/02/19  5:05 PM   Specimen: Nasopharyngeal Swab  Result Value Ref Range Status   SARS Coronavirus 2 by RT PCR NEGATIVE NEGATIVE Final    Comment: (NOTE) SARS-CoV-2 target nucleic acids are NOT DETECTED. The SARS-CoV-2 RNA is generally detectable in upper respiratoy specimens during the acute  phase of infection. The lowest concentration of SARS-CoV-2 viral copies this assay can detect is 131 copies/mL. A negative result does not preclude SARS-Cov-2 infection and should not be used as the sole basis for treatment or other patient management decisions. A negative result may occur with  improper specimen collection/handling, submission of  specimen other than nasopharyngeal swab, presence of viral mutation(s) within the areas targeted by this assay, and inadequate number of viral copies (<131 copies/mL). A negative result must be combined with clinical observations, patient history, and epidemiological information. The expected result is Negative. Fact Sheet for Patients:  PinkCheek.be Fact Sheet for Healthcare Providers:  GravelBags.it This test is not yet ap proved or cleared by the [place] FDA and  has been authorized for detection and/or diagnosis of SARS-CoV-2 by FDA under an Emergency Use Authorization (EUA). This EUA will remain  in effect (meaning this test can be used) for the duration of the COVID-19 declaration under Section 564(b)(1) of the Act, 21 U.S.C. section 360bbb-3(b)(1), unless the authorization is terminated or revoked sooner.    Influenza A by PCR NEGATIVE NEGATIVE Final   Influenza B by PCR NEGATIVE NEGATIVE Final    Comment: (NOTE) The Xpert Xpress SARS-CoV-2/FLU/RSV assay is intended as an aid in  the diagnosis of influenza from Nasopharyngeal swab specimens and  should not be used as a sole basis for treatment. Nasal washings and  aspirates are unacceptable for Xpert Xpress SARS-CoV-2/FLU/RSV  testing. Fact Sheet for Patients: PinkCheek.be Fact Sheet for Healthcare Providers: GravelBags.it This test is not yet approved or cleared by the [place] FDA and  has been authorized for detection and/or diagnosis of SARS-CoV-2 by   FDA under an Emergency Use Authorization (EUA). This EUA will remain  in effect (meaning this test can be used) for the duration of the  Covid-19 declaration under Section 564(b)(1) of the Act, 21  U.S.C. section 360bbb-3(b)(1), unless the authorization is  terminated or revoked. Performed at [facility], [address] [place], [place] [address]   Body fluid culture (includes gram stain)     Status: None   Collection Time: 05/02/19  8:31 PM   Specimen: Pleural Fluid  Result Value Ref Range Status   Specimen Description   Final    PLEURAL Performed at [facility], [address], [place], [place] [address]    Special Requests   Final    NONE Performed at [facility], [address] [place], [place] [address]    Gram Stain   Final    FEW WBC PRESENT, PREDOMINANTLY MONONUCLEAR NO ORGANISMS SEEN    Culture   Final    NO GROWTH 3 DAYS Performed at [facility], [place] [address], [place], [place] [address]    Report Status 05/06/2019 FINAL  Final  Urine culture     Status: None   Collection Time: 05/03/19  4:15 AM   Specimen: In/Out Cath Urine  Result Value Ref Range Status   Specimen Description   Final    IN/OUT CATH URINE Performed at [facility], [address], [place], [place] [address]    Special Requests   Final    NONE Performed at [facility], [address] [place], [place] [address]    Culture   Final    NO GROWTH Performed at [facility], [place] [address] [place], [place] [address]    Report Status 05/04/2019 FINAL  Final  MRSA PCR Screening     Status: None   Collection Time: 05/03/19  8:08 PM   Specimen: Nasal Mucosa; Nasopharyngeal  Result Value Ref Range Status   MRSA by PCR NEGATIVE NEGATIVE Final    Comment:        The GeneXpert MRSA Assay (FDA approved for NASAL specimens only), is  one component of a comprehensive MRSA colonization surveillance program. It is not intended to  diagnose MRSA infection nor to guide or monitor treatment for MRSA infections. Performed at [facility], [place], [place] [address]   SARS CORONAVIRUS 2 (TAT 6-24 HRS) Nasopharyngeal Nasopharyngeal Swab     Status: None   Collection Time: 05/09/19  9:24 AM   Specimen: Nasopharyngeal Swab  Result Value Ref Range Status   SARS Coronavirus 2 NEGATIVE NEGATIVE Final    Comment: (NOTE) SARS-CoV-2 target nucleic acids are NOT DETECTED. The SARS-CoV-2 RNA is generally detectable in upper and lower respiratory specimens during the acute phase of infection. Negative results do not preclude SARS-CoV-2 infection, do not rule out co-infections with other pathogens, and should not be used as the sole basis for treatment or other patient management decisions. Negative results must be combined with clinical observations, patient history, and epidemiological information. The expected result is Negative. Fact Sheet for Patients: SugarRoll.be Fact Sheet for Healthcare Providers: https://www.woods-mathews.com/ This test is not yet approved or cleared by the [place] FDA and  has been authorized for detection and/or diagnosis of SARS-CoV-2 by FDA under an Emergency Use Authorization (EUA). This EUA will remain  in effect (meaning this test can be used) for the duration of the COVID-19 declaration under Section 56 4(b)(1) of the Act, 21 U.S.C. section 360bbb-3(b)(1), unless the authorization is terminated or revoked sooner. Performed at [facility], [place] [address] [place], [place] [address]     RADIOLOGY:  DG Forearm Right  Result Date: 05/09/2019 CLINICAL DATA:  Pain radiating to the elbow and mid forearm. EXAM: RIGHT FOREARM - 2 VIEW COMPARISON:  None. FINDINGS: There is no evidence of fracture or other focal bone lesions. Soft tissues are unremarkable. There is diffuse osteopenia. IMPRESSION: No acute fracture or  dislocation.  Diffuse osteopenia. Electronically Signed   By: Abelardo Diesel M.D.   On: 05/09/2019 12:38   DG Chest Port 1 View  Result Date: 05/09/2019 CLINICAL DATA:  Pleural effusion and infiltrate EXAM: PORTABLE CHEST 1 VIEW COMPARISON:  May 07, 2019 FINDINGS: Chest tube has been removed from the left side. No pneumothorax. Layering pleural effusion on the left persists. There is consolidation in the right base with a lesser degree of patchy opacity in the left base, similar to recent study. There is mild cardiomegaly with pulmonary venous hypertension. No adenopathy. There is aortic atherosclerosis. No bone lesions. IMPRESSION: Airspace opacity in the lung bases, more on the right than on the left, stable. Layering effusion on the left, essentially stable. No pneumothorax following chest tube removal. Stable cardiomegaly with a degree of pulmonary vascular congestion. Aortic Atherosclerosis (ICD10-I70.0). Electronically Signed   By: Lowella Grip III M.D.   On: 05/09/2019 08:38     CODE STATUS:     Code Status Orders  (From admission, onward)         Start     Ordered   05/02/19 2116  Full code  Continuous     05/02/19 2131        Code Status History    Date Active Date Inactive Code Status Order ID Comments User Context   02/16/2018 1921 02/18/2018 1826 Full Code [id number]  Hillary Bow, MD ED   02/06/2017 2332 02/11/2017 1448 Full Code [id number]  Max Sane, MD Inpatient   09/08/2015 2101 09/11/2015 1243 Full Code [id number]  Max Sane, MD Inpatient   09/12/2014 2025 09/18/2014 1609 Full Code [id number]  Hower, Shanon Brow  K, MD ED   Advance Care Planning Activity    Advance Directive Documentation     Most Recent Value  Type of Advance Directive  Healthcare Power of Attorney  Pre-existing out of facility DNR order (yellow form or pink MOST form)  --  "MOST" Form in Place?  --       TOTAL TIME TAKING CARE OF THIS PATIENT: *40* minutes.    Fritzi Mandes M.D  [facility]     CC: Primary care physician; [name], MD

## 2019-05-10 NOTE — Discharge Instructions (Signed)
Wean oxygen to keep sats >92%

## 2019-05-10 NOTE — TOC Transition Note (Signed)
Transition of Care [facility]) - CM/SW Discharge Note   Patient Details  [name] MRN: [id number] Date of Birth: 1924/08/08  [facility] CM/SW Contact:  Elease Hashimoto, LCSW Phone Number: 05/10/2019, 8:47 AM   Clinical Narrative:   Pt's COVID test back negative. Spoke with [facility] who is meeting with son at 1:00 to complete paperwork. They are ready for her. Pt and son aware pt being transferring to [facility] today. Bedside RN aware and will call report to [phone]. Paperwork place in chart for EMS.    Final next level of care: Skilled Nursing Facility Barriers to Discharge: Barriers Resolved   Patient Goals and CMS Choice Patient states their goals for this hospitalization and ongoing recovery are:: get better CMS Medicare.gov Compare Post Acute Care list provided to:: Patient Represenative (must comment) Choice offered to / list presented to : Adult Children  Discharge Placement PASRR number recieved: 05/08/19            Patient chooses bed at: Other - please specify in the comment section below:([facility]) Patient to be transferred to facility by: EMS Name of family member notified: Jamie-son Patient and family notified of of transfer: 05/10/19  Discharge Plan and Services In-house Referral: Clinical Social Work   Post Acute Care Choice: [facility]                               Social Determinants of Health (SDOH) Interventions     Readmission Risk Interventions No flowsheet data found.

## 2019-05-10 NOTE — Progress Notes (Signed)
Pt back to [facility]  Via ems. A/o no resp distress  02 in use at 3l [place].  Sl d/c. Report to [employer] [facility] vss

## 2019-05-10 NOTE — Plan of Care (Signed)

## 2019-05-13 ENCOUNTER — Telehealth: Payer: Self-pay | Admitting: Family

## 2019-05-13 NOTE — Telephone Encounter (Signed)
LVM regarding patients new patient chf clinic appointment with [place] on 4/5. Asked patient to call back when she can.    Alyse Low, [place]

## 2019-05-16 ENCOUNTER — Ambulatory Visit: Payer: Medicare Other | Admitting: Family

## 2019-05-20 ENCOUNTER — Ambulatory Visit: Payer: Medicare Other | Admitting: Cardiology

## 2019-05-20 ENCOUNTER — Inpatient Hospital Stay: Payer: Medicare Other | Admitting: Cardiothoracic Surgery

## 2019-06-03 ENCOUNTER — Other Ambulatory Visit: Payer: Self-pay

## 2019-06-03 DIAGNOSIS — J9601 Acute respiratory failure with hypoxia: Secondary | ICD-10-CM

## 2019-06-07 ENCOUNTER — Inpatient Hospital Stay: Payer: Medicare Other | Admitting: Cardiothoracic Surgery

## 2019-06-07 IMAGING — CR DG CHEST 2V
1 series · 2 of 2 positions shown · non-contrast
Comparison: 09/09/2015

CLINICAL DATA: Shortness of breath and cough

EXAM:
CHEST  2 VIEW

[Series 1: dg chest 2 view · 0.14mm/px · 2 of 2 slices shown]
[im 1/2]
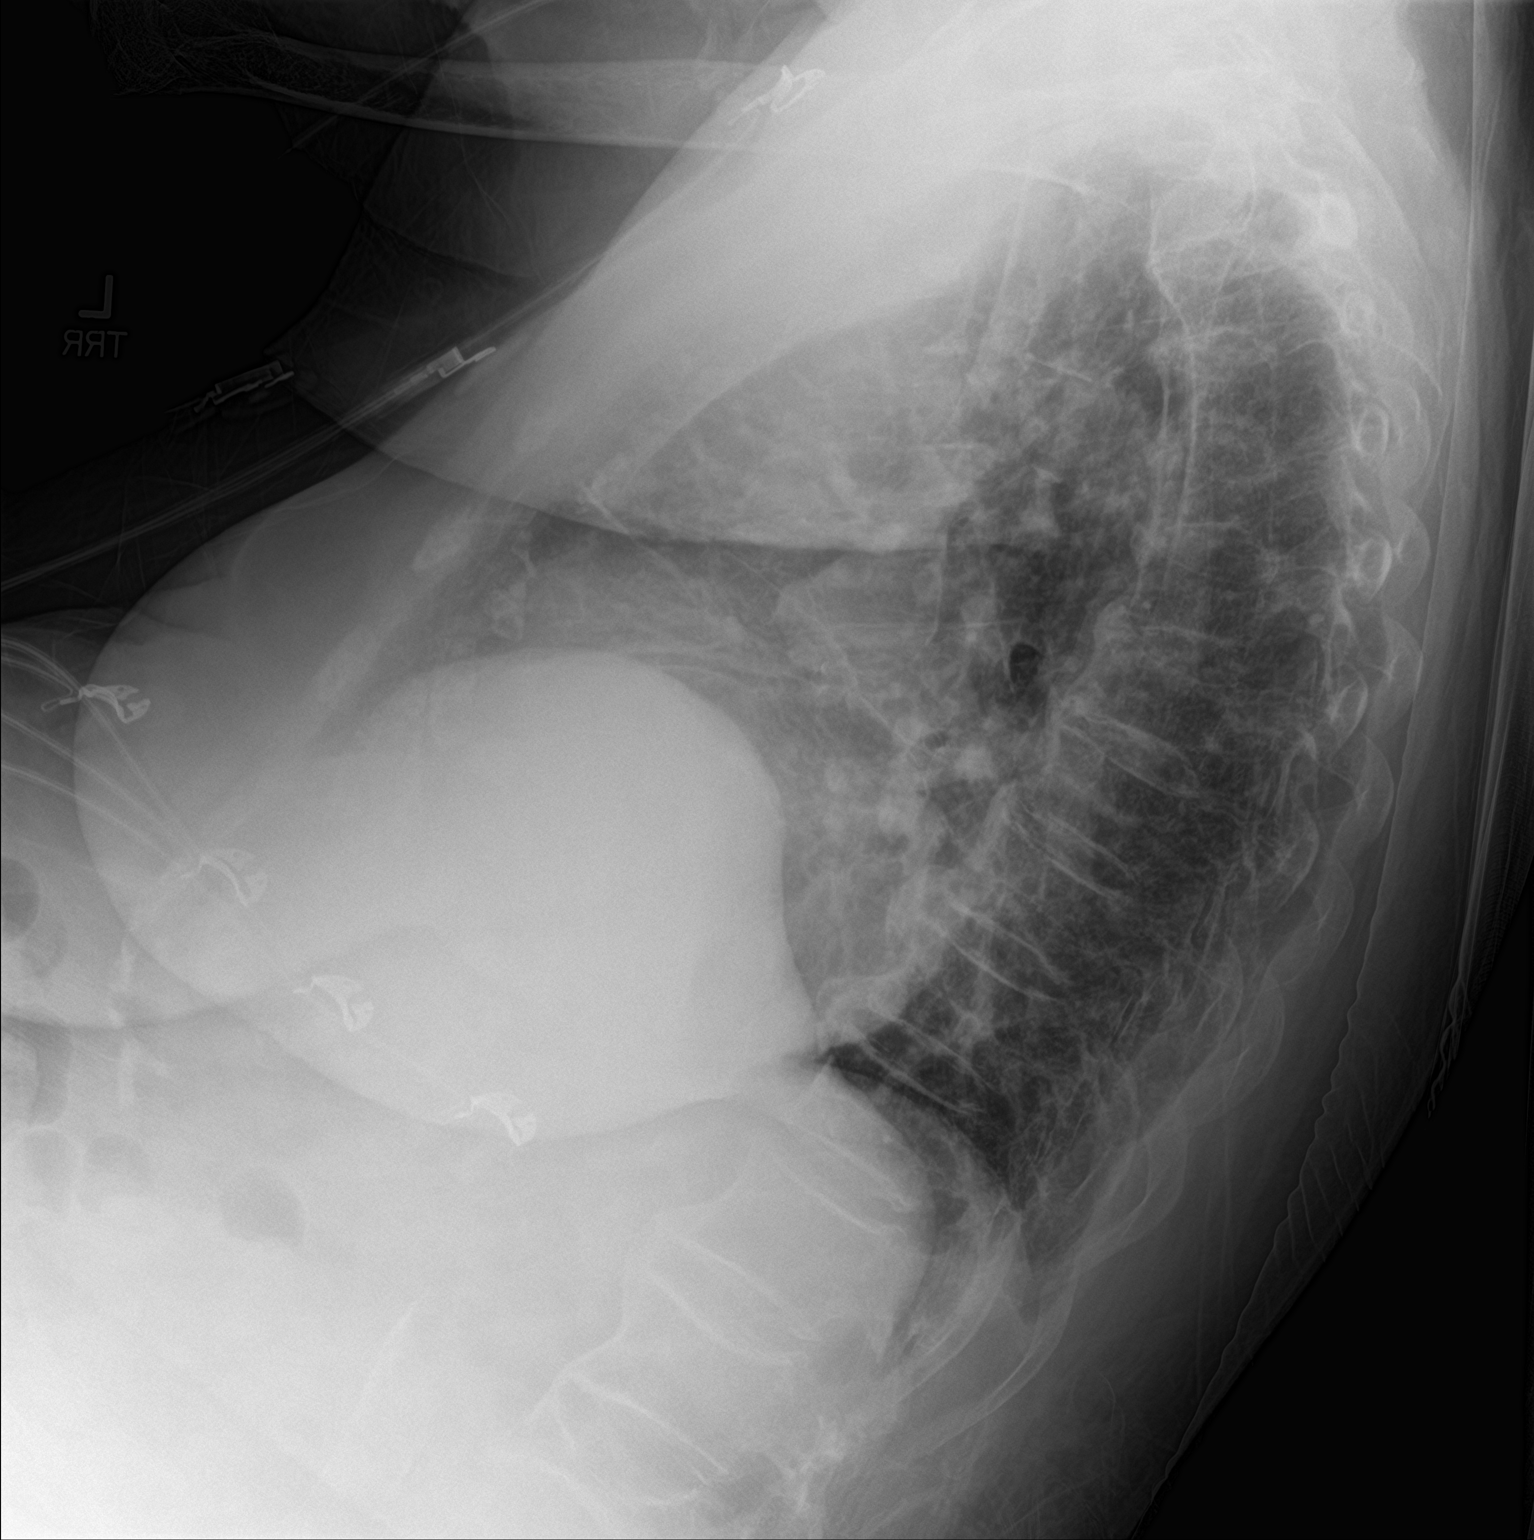
[im 2/2]
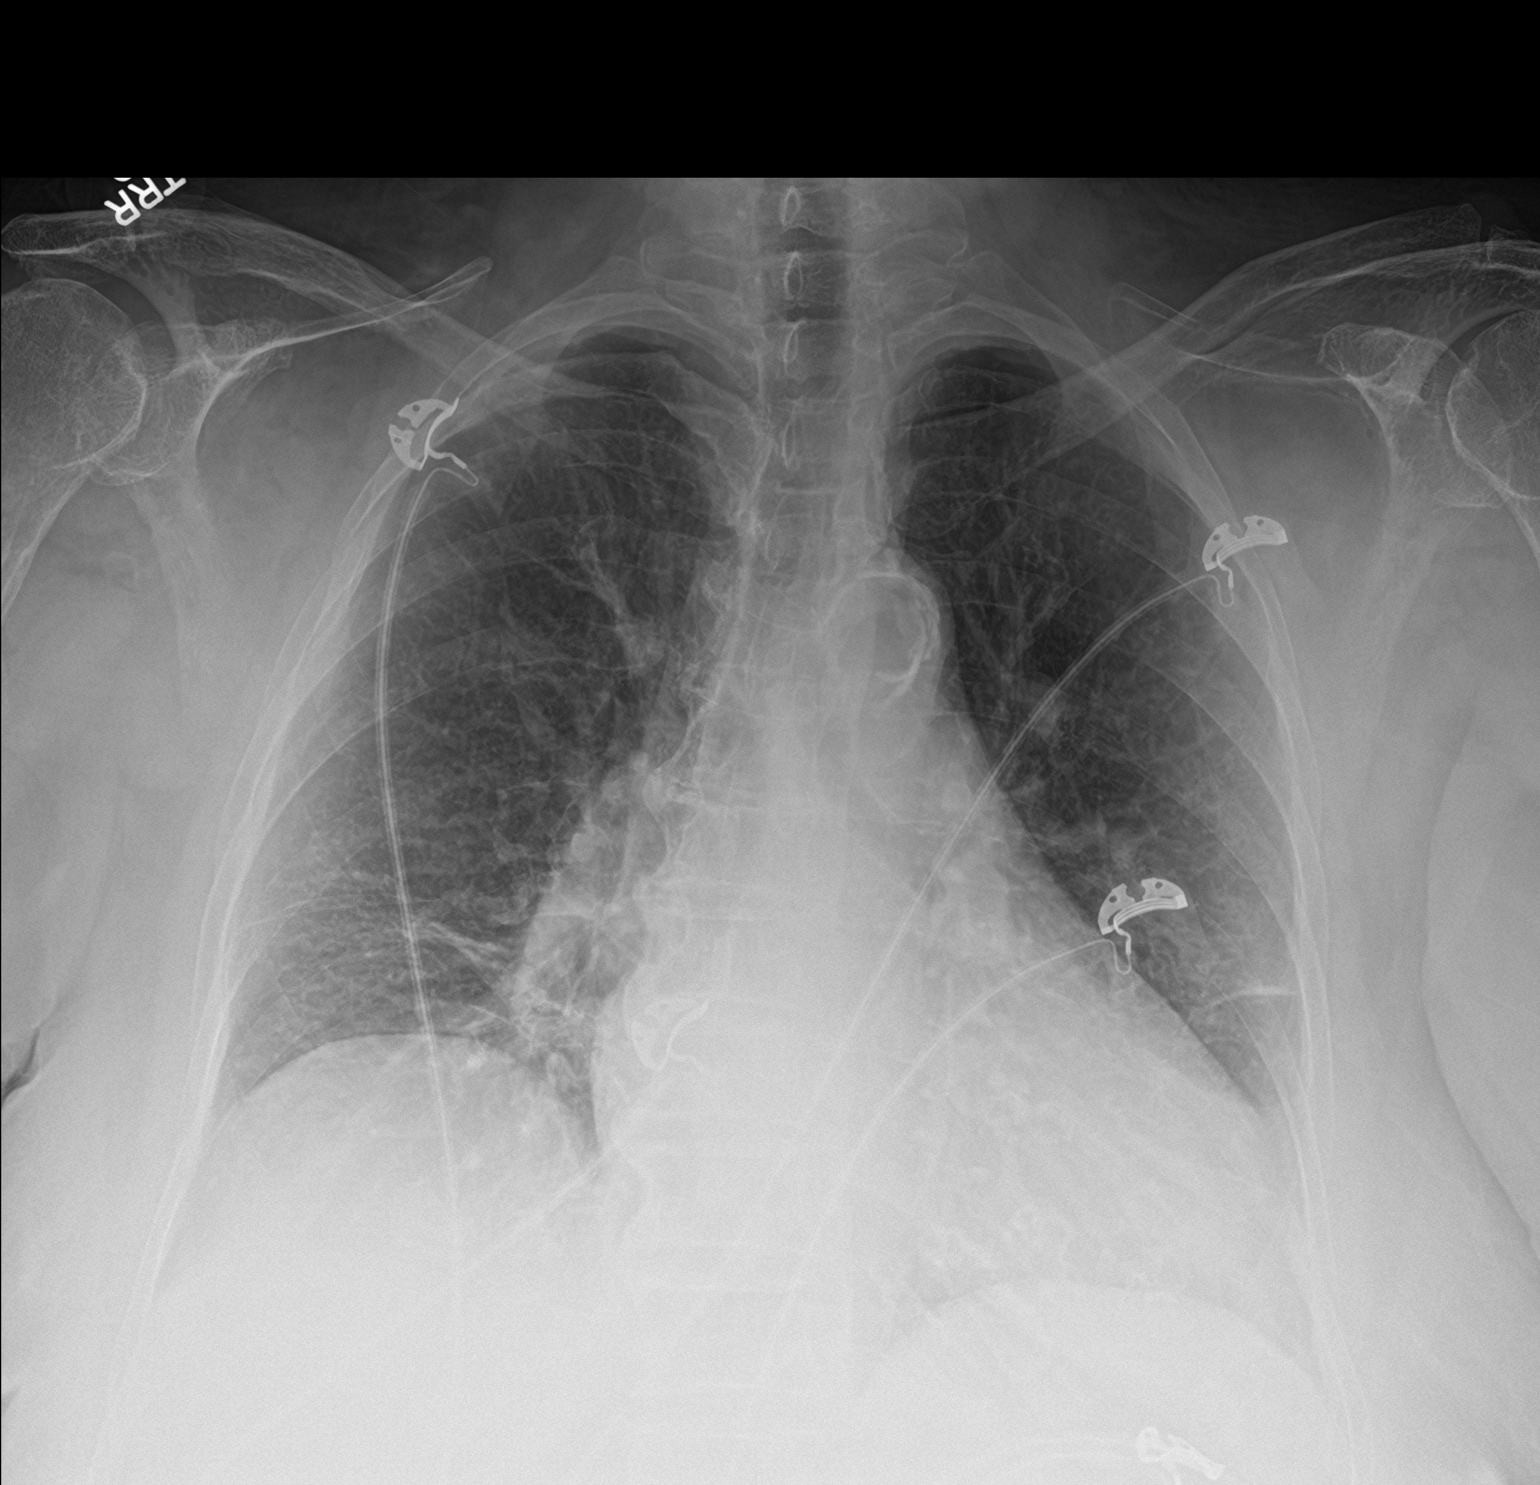

[2 of 2 positions shown; findings below may reference images not displayed]

FINDINGS: Chronic cardiomegaly. Linear opacities at the bases consistent with
scarring. There is no edema, consolidation, effusion, or
pneumothorax. Degenerative endplate spurring. Eventration of the
right diaphragm.
IMPRESSION: No acute finding.

Chronic cardiomegaly and lung scarring.

## 2019-06-28 ENCOUNTER — Inpatient Hospital Stay: Payer: Medicare Other | Admitting: Cardiothoracic Surgery

## 2019-07-03 IMAGING — DX DG HAND COMPLETE 3+V*L*
4 series · 4 of 4 positions shown · non-contrast
Comparison: None.

CLINICAL DATA: Fall, bruising to the left hand

EXAM:
LEFT HAND - COMPLETE 3+ VIEW

[hand ap]
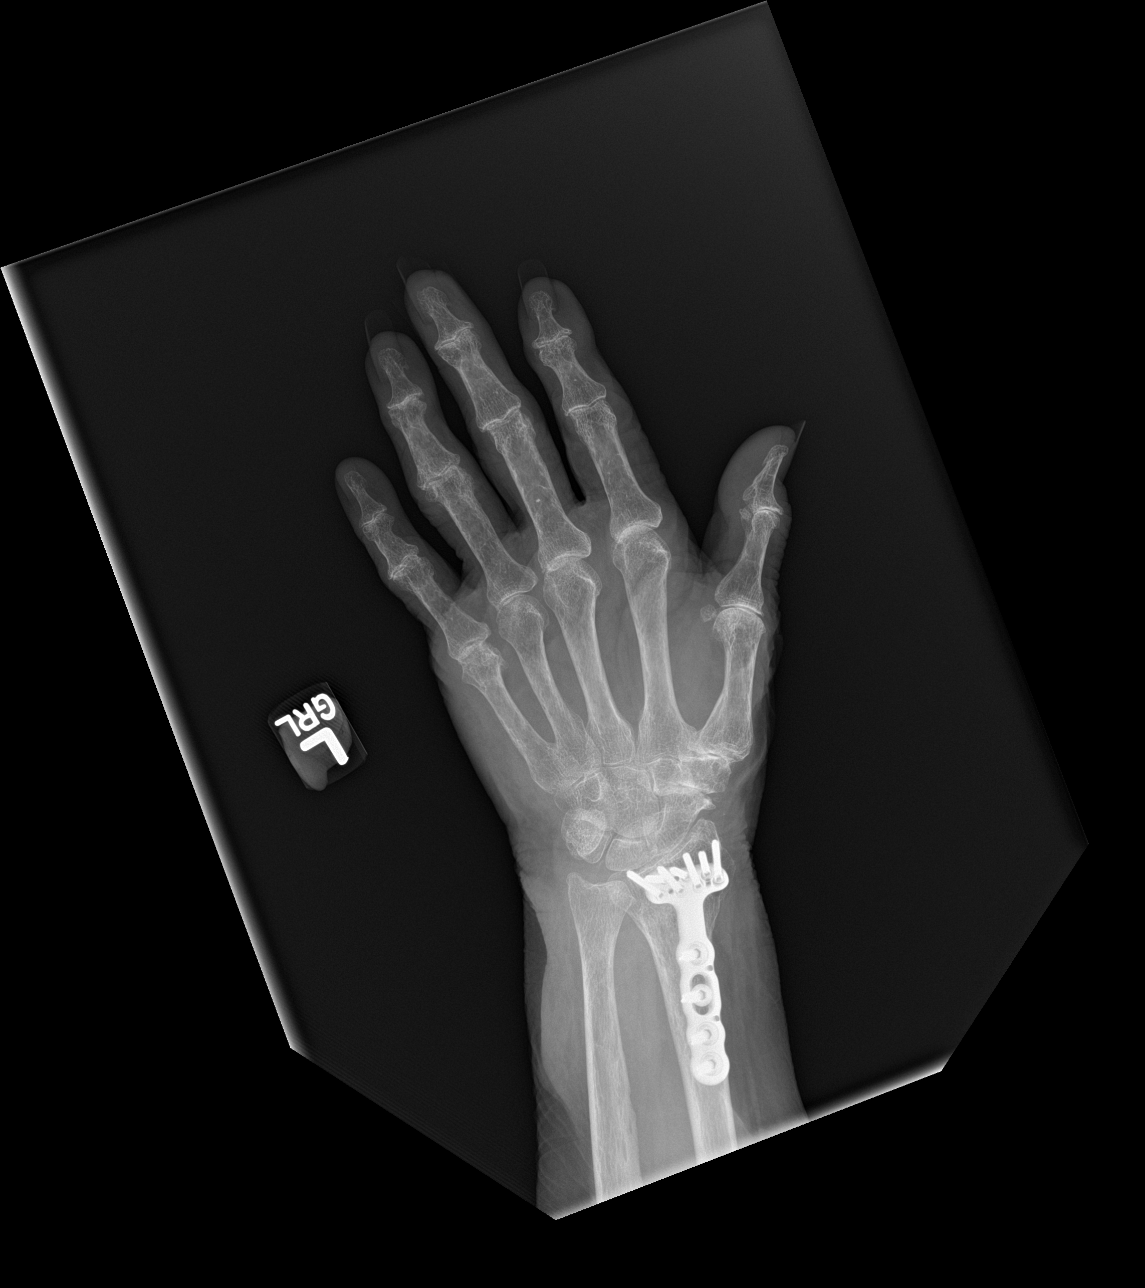

[hand obl]
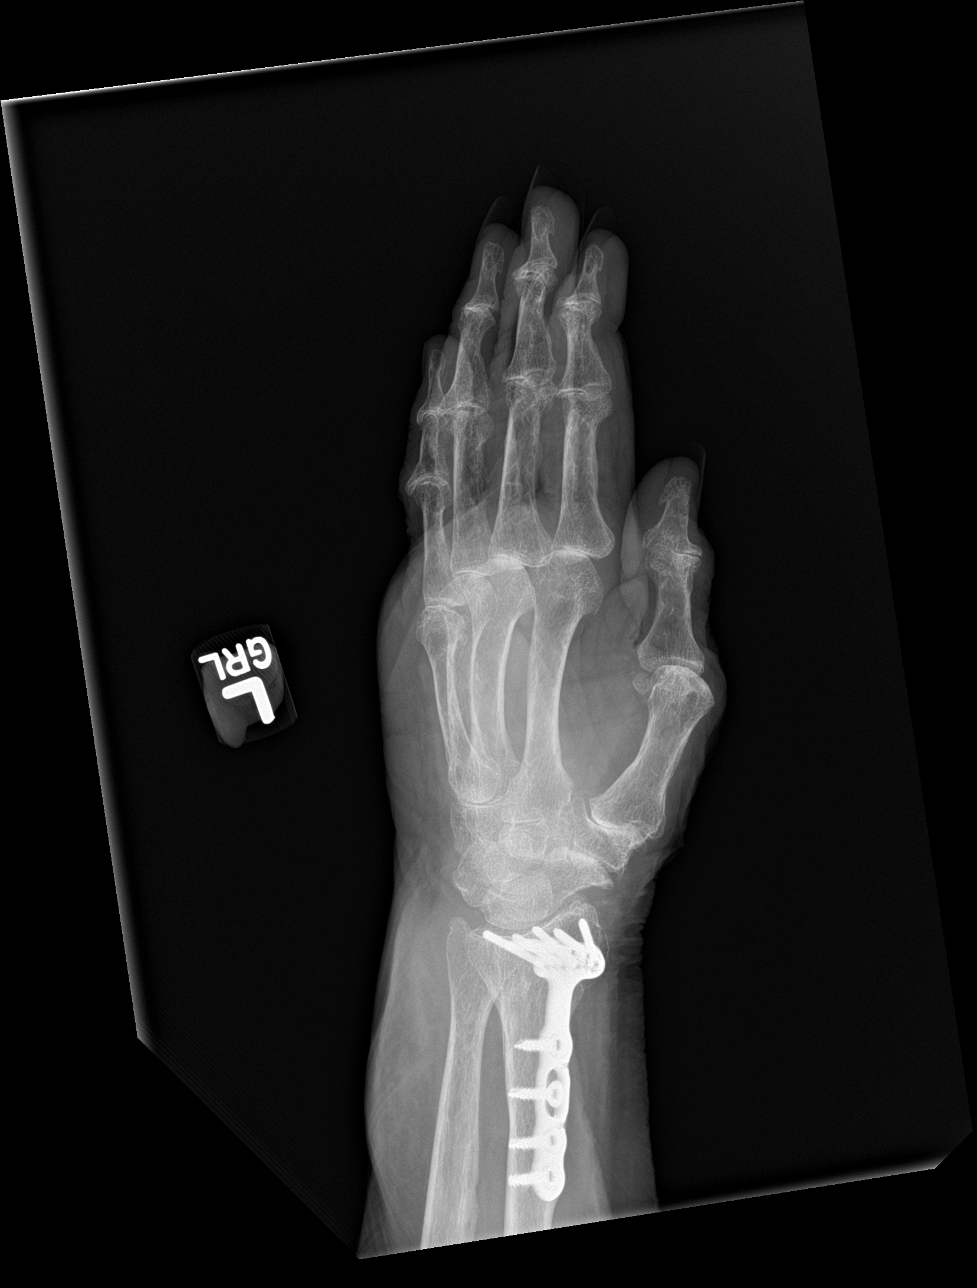

[hand lat (1 of 2)]
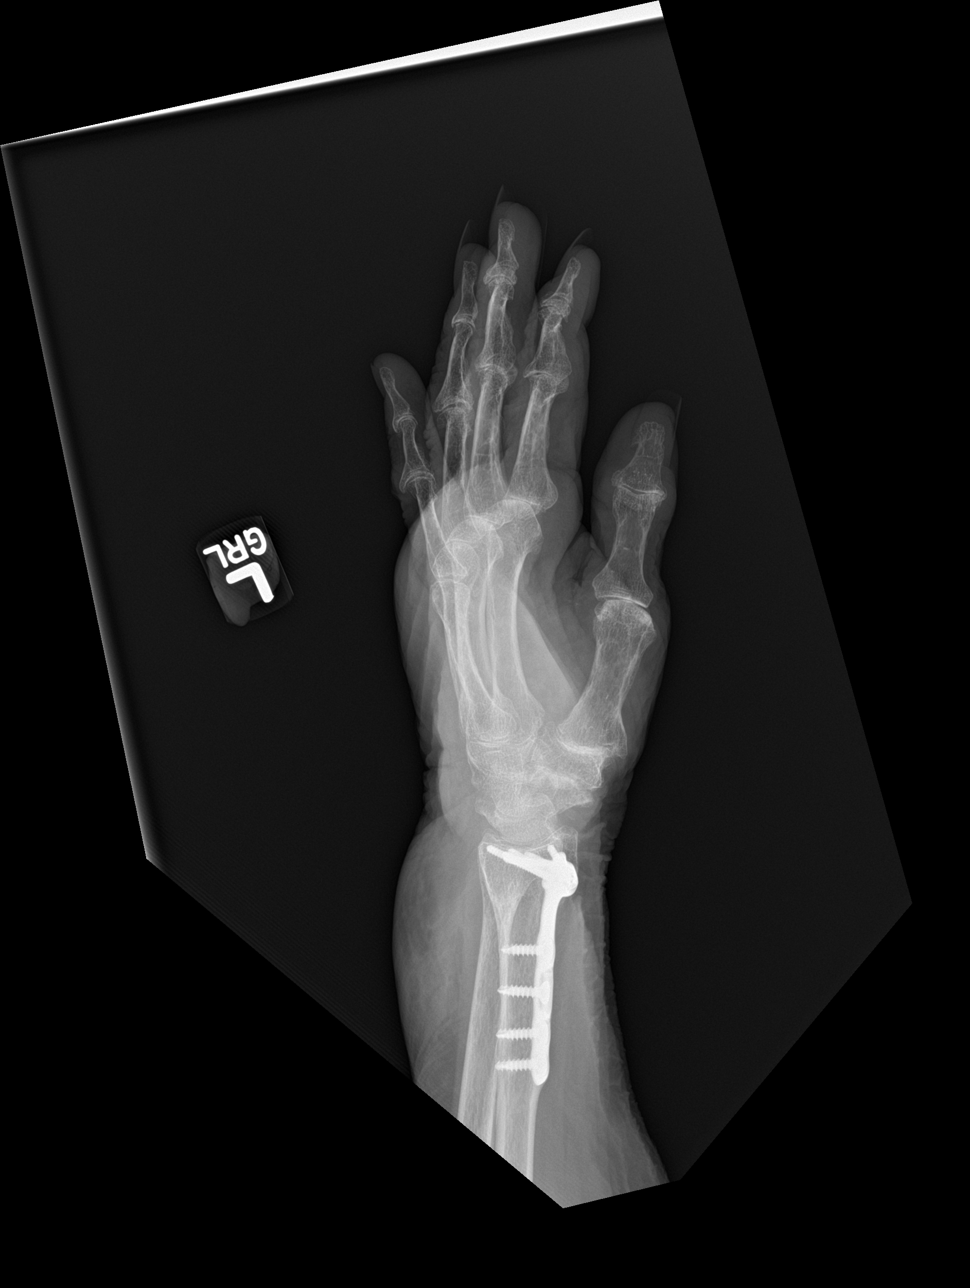

[hand lat (2 of 2)]
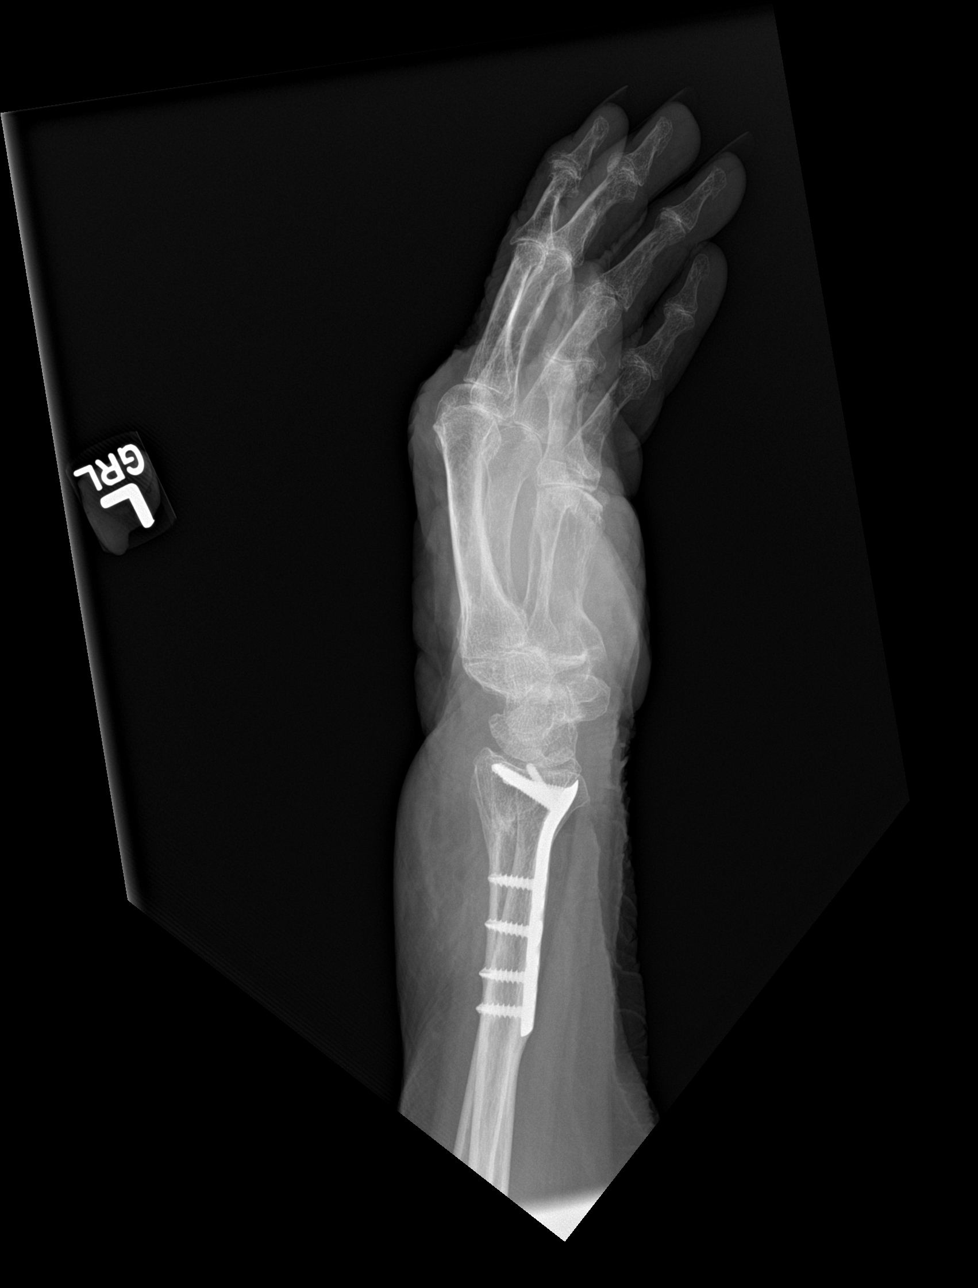

[4 of 4 positions shown; findings below may reference images not displayed]

FINDINGS: Surgical plate and screw fixation of the distal radius across old
fracture deformity. Diffuse osteopenia. No acute displaced fracture
or malalignment. Diffuse joint space narrowing at the DIP and PIP
joints. Arthritis at the first CMC and MCP joints.
IMPRESSION: 1. No acute osseous abnormality
2. Osteopenia with diffuse degenerative changes.

## 2019-07-12 ENCOUNTER — Inpatient Hospital Stay: Payer: Medicare Other | Admitting: Cardiothoracic Surgery

## 2019-07-22 ENCOUNTER — Ambulatory Visit
Admission: RE | Admit: 2019-07-22 | Discharge: 2019-07-22 | Disposition: A | Discharge status: Home / Self Care | Payer: Medicare Other | Attending: Cardiothoracic Surgery | Admitting: Cardiothoracic Surgery

## 2019-07-22 ENCOUNTER — Ambulatory Visit
Admission: RE | Admit: 2019-07-22 | Discharge: 2019-07-22 | Disposition: A | Discharge status: Home / Self Care | Payer: Medicare Other | Source: Ambulatory Visit | Attending: Cardiothoracic Surgery | Admitting: Cardiothoracic Surgery

## 2019-07-22 ENCOUNTER — Other Ambulatory Visit: Payer: Self-pay

## 2019-07-22 ENCOUNTER — Ambulatory Visit (INDEPENDENT_AMBULATORY_CARE_PROVIDER_SITE_OTHER): Payer: Medicare Other | Admitting: Cardiothoracic Surgery

## 2019-07-22 ENCOUNTER — Encounter: Payer: Self-pay | Admitting: Cardiothoracic Surgery

## 2019-07-22 VITALS — BP 162/80 | HR 75 | Temp 97.9°F | Resp 16 | Ht 62.0 in | Wt 178.0 lb

## 2019-07-22 DIAGNOSIS — J9601 Acute respiratory failure with hypoxia: Secondary | ICD-10-CM | POA: Insufficient documentation

## 2019-07-22 NOTE — Progress Notes (Signed)
Andrea Schaefer Follow Up Note  Patient ID: Andrea Schaefer, female   DOB: [birth year], 86 y.o.   MRN: [id number]  HISTORY: She returns today in follow-up.  She is much more interactive than she had been when I saw her in the hospital.  She came in several months ago with a left-sided pleural effusion which was ultimately felt secondary to heart failure.  She does not complain of any shortness of breath.  She does wear oxygen 24/7.  She is fairly independent at home and lives with her son.    Vitals:   07/22/19 1417  BP: (!) 162/80  Pulse: 75  Resp: 16  Temp: 97.9 F (36.6 C)  SpO2: 96%     EXAM:  Resp: Lungs are clear bilaterally.  No respiratory distress, normal effort. Heart:  Regular without murmurs Abd:  Abdomen is soft, non distended and non tender. No masses are palpable.  There is no rebound and no guarding.  Neurological: Alert and oriented to person, place, and time. Coordination normal.  Skin: Skin is warm and dry. No rash noted. No diaphoretic. No erythema. No pallor.  The chest tube site is closed and healed Psychiatric: Normal mood and affect. Normal behavior. Judgment and thought content normal.      ASSESSMENT: Status post left chest tube insertion for pleural effusion   PLAN:   I have independently reviewed the patient's chest x-ray from today.  There is a small left pleural effusion and some platelike atelectasis in the right midlung zone.  Overall the changes are much improved.  I did not make return appointment for her but would be happy to see her should the need arise.    Nestor Lewandowsky, MD

## 2019-07-22 NOTE — Patient Instructions (Signed)
Follow-up with our office as needed.  Please call and ask to speak with a nurse if you develop questions or concerns.    Pleural Effusion Pleural effusion is an abnormal buildup of fluid in the layers of tissue between the lungs and the inside of the chest (pleural space) The two layers of tissue that line the lungs and the inside of the chest are called pleura. Usually, there is no air in the space between the pleura, only a thin layer of fluid. Some conditions can cause a large amount of fluid to build up, which can cause the lung to collapse if untreated. A pleural effusion is usually caused by another disease that requires treatment. What are the causes? Pleural effusion can be caused by:  Heart failure.  Certain infections, such as pneumonia or tuberculosis.  Cancer.  A blood clot in the lung (pulmonary embolism).  Complications from surgery, such as from open heart surgery.  Liver disease (cirrhosis).  Kidney disease. What are the signs or symptoms? In some cases, pleural effusion may cause no symptoms. If symptoms are present, they may include:  Shortness of breath, especially when lying down.  Chest pain. This may get worse when taking a deep breath.  Fever.  Dry, long-lasting (chronic) cough.  Hiccups.  Rapid breathing. An underlying condition that is causing the pleural effusion (such as heart failure, pneumonia, blood clots, tuberculosis, or cancer) may also cause other symptoms. How is this diagnosed? This condition may be diagnosed based on:  Your symptoms and medical history.  A physical exam.  A chest X-ray.  A procedure to use a needle to remove fluid from the pleural space (thoracentesis). This fluid is tested.  Other imaging studies of the chest, such as ultrasound or CT scan. How is this treated? Depending on the cause of your condition, treatment may include:  Treating the underlying condition that is causing the effusion. When that condition  improves, the effusion will also improve. Examples of treatment for underlying conditions include: ? Antibiotic medicines to treat an infection. ? Diuretics or other heart medicines to treat heart failure.  Thoracentesis.  Placing a thin flexible tube under your skin and into your chest to continuously drain the effusion (indwelling pleural catheter).  Surgery to remove the outer layer of tissue from the pleural space (decortication).  A procedure to put medicine into the chest cavity to seal the pleural space and prevent fluid buildup (pleurodesis).  Chemotherapy and radiation therapy, if you have cancerous (malignant) pleural effusion. These treatments are typically used to treat cancer. They kill certain cells in the body. Follow these instructions at home:  Take over-the-counter and prescription medicines only as told by your health care provider.  Ask your health care provider what activities are safe for you.  Keep track of how long you are able to do mild exercise (such as walking) before you get short of breath. Write down this information to share with your health care provider. Your ability to exercise should improve over time.  Do not use any products that contain nicotine or tobacco, such as cigarettes and e-cigarettes. If you need help quitting, ask your health care provider.  Keep all follow-up visits as told by your health care provider. This is important. Contact a health care provider if:  The amount of time that you are able to do mild exercise: ? Decreases. ? Does not improve with time.  You have a fever. Get help right away if:  You are short of  breath.  You develop chest pain.  You develop a new cough. Summary  Pleural effusion is an abnormal buildup of fluid in the layers of tissue between the lungs and the inside of the chest.  Pleural effusion can have many causes, including heart failure, pulmonary embolism, infections, or cancer.  Symptoms of  pleural effusion can include shortness of breath, chest pain, fever, long-lasting (chronic) cough, hiccups, or rapid breathing.  Diagnosis often involves making images of the chest (such as with ultrasound or X-ray) and removing fluid (thoracentesis) to send for testing.  Treatment for pleural effusion depends on what underlying condition is causing it. This information is not intended to replace advice given to you by your health care provider. Make sure you discuss any questions you have with your health care provider. Document Revised: 01/13/2017 Document Reviewed: 10/06/2016 [employer] Patient Education  2020 [employer].

## 2021-06-14 DEATH — deceased
